# Patient Record
Sex: Female | Born: 1935 | Race: White | Hispanic: No | Smoking: Never smoker
Health system: Southern US, Community
[De-identification: ages and names within clinical notes are randomized; demographics above are authoritative.]

## PROBLEM LIST (undated history)

## (undated) DIAGNOSIS — I729 Aneurysm of unspecified site: Secondary | ICD-10-CM

## (undated) DIAGNOSIS — I447 Left bundle-branch block, unspecified: Secondary | ICD-10-CM

## (undated) DIAGNOSIS — I639 Cerebral infarction, unspecified: Secondary | ICD-10-CM

## (undated) DIAGNOSIS — I219 Acute myocardial infarction, unspecified: Secondary | ICD-10-CM

## (undated) HISTORY — PX: APPENDECTOMY: SHX54

## (undated) HISTORY — PX: ROTATOR CUFF REPAIR: SHX139

## (undated) HISTORY — PX: ABDOMINAL HYSTERECTOMY: SHX81

---

## 2000-06-27 ENCOUNTER — Encounter: Admission: RE | Admit: 2000-06-27 | Discharge: 2000-06-27 | Payer: Self-pay | Admitting: *Deleted

## 2000-06-27 ENCOUNTER — Encounter: Payer: Self-pay | Admitting: Internal Medicine

## 2001-06-29 ENCOUNTER — Encounter: Payer: Self-pay | Admitting: Internal Medicine

## 2001-06-29 ENCOUNTER — Encounter: Admission: RE | Admit: 2001-06-29 | Discharge: 2001-06-29 | Payer: Self-pay | Admitting: Internal Medicine

## 2003-08-01 ENCOUNTER — Encounter: Payer: Self-pay | Admitting: Orthopedic Surgery

## 2003-08-04 ENCOUNTER — Observation Stay (HOSPITAL_COMMUNITY): Admission: RE | Admit: 2003-08-04 | Discharge: 2003-08-05 | Payer: Self-pay | Admitting: Orthopedic Surgery

## 2006-09-03 ENCOUNTER — Ambulatory Visit: Payer: Self-pay | Admitting: Gastroenterology

## 2006-09-08 ENCOUNTER — Ambulatory Visit: Payer: Self-pay | Admitting: Gastroenterology

## 2011-05-20 ENCOUNTER — Inpatient Hospital Stay: Payer: Self-pay | Admitting: Internal Medicine

## 2011-05-20 IMAGING — CR DG CHEST 1V PORT
1 series · 1 of 1 positions shown · non-contrast
Comparison: none

REASON FOR EXAM: Chest Pain
COMMENTS:

PROCEDURE:     DXR - DXR PORTABLE CHEST SINGLE VIEW  - [DATE] [DATE]
RESULT:     The lungs are clear. The cardiac silhouette and visualized bony
skeleton are unremarkable.

[view not recorded]
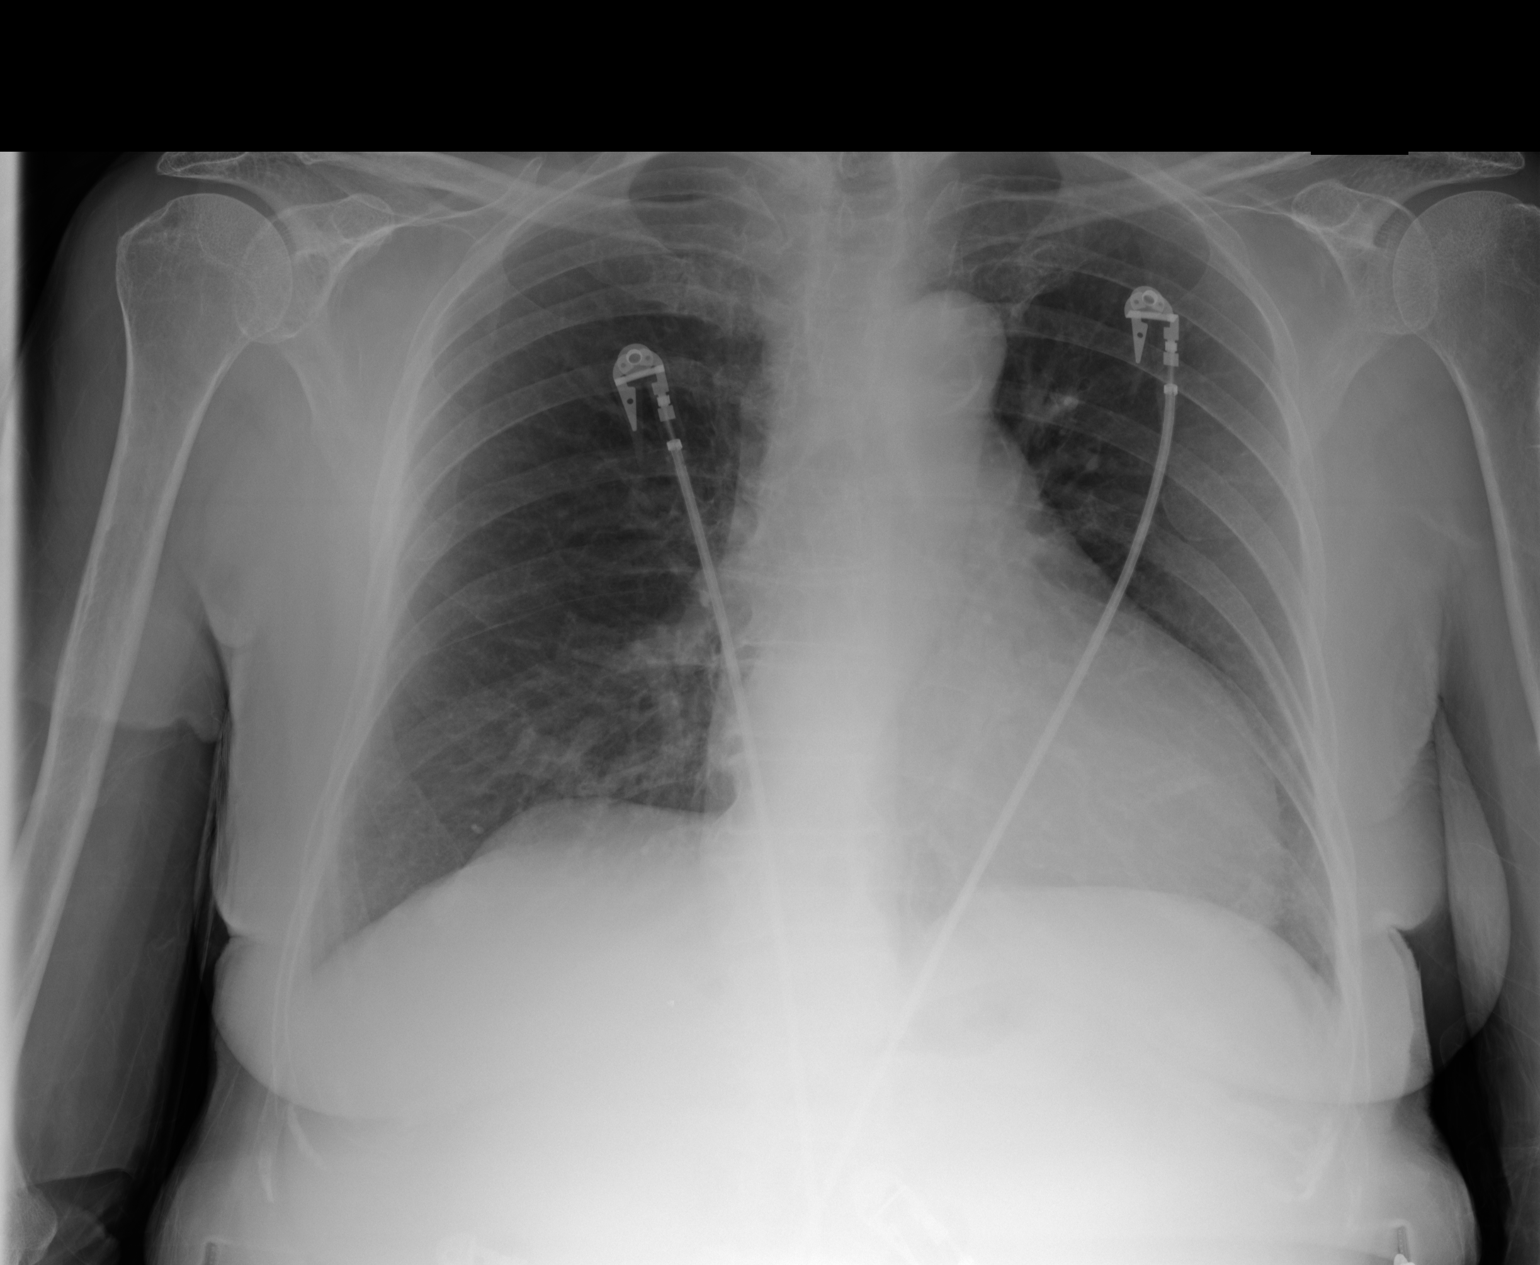

[1 of 1 positions shown; findings below may reference images not displayed]

IMPRESSION: 1. Chest radiograph without evidence of acute cardiopulmonary disease.

## 2011-05-20 IMAGING — US US CAROTID DUPLEX BILAT
1 series · 17 of 24 positions shown · non-contrast
Comparison: none

REASON FOR EXAM: syncope
COMMENTS:

[Series 1: us carotid duplex bilat · 17 of 63 slices shown]
[im 1/63]
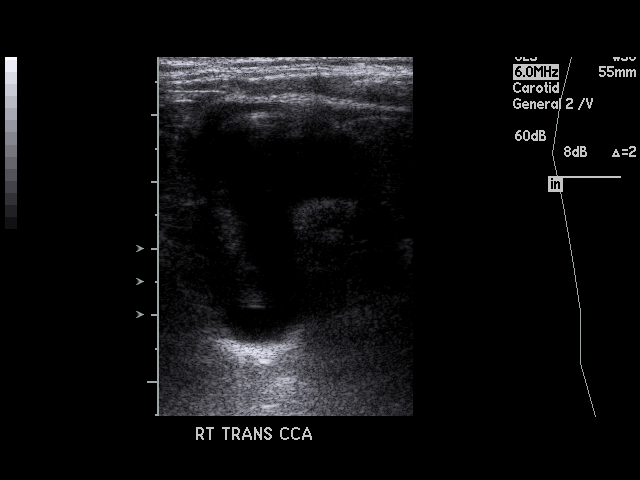
[im 6/63]
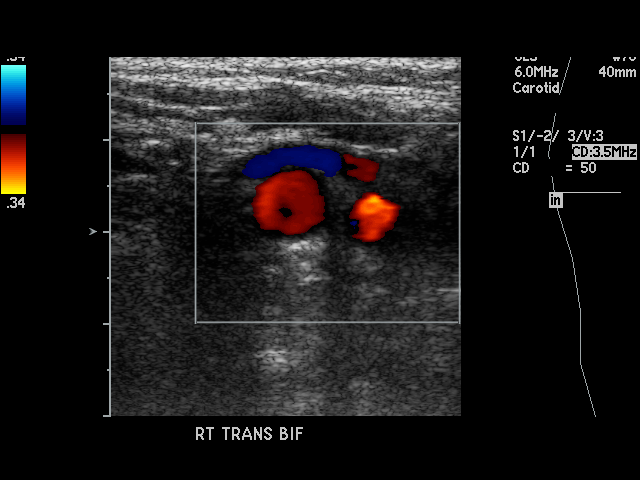
[im 9/63]
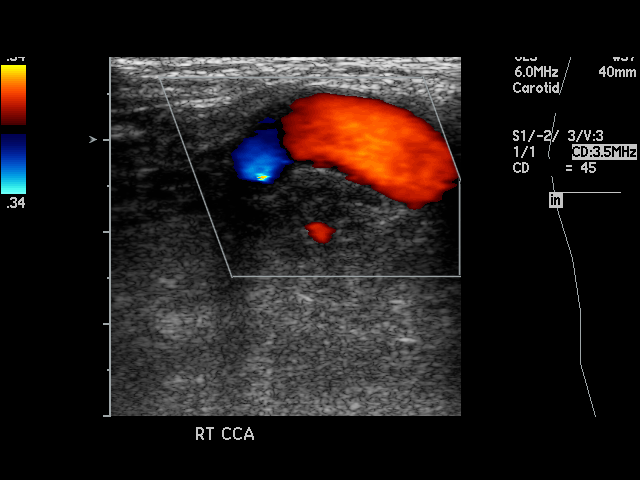
[im 11/63]
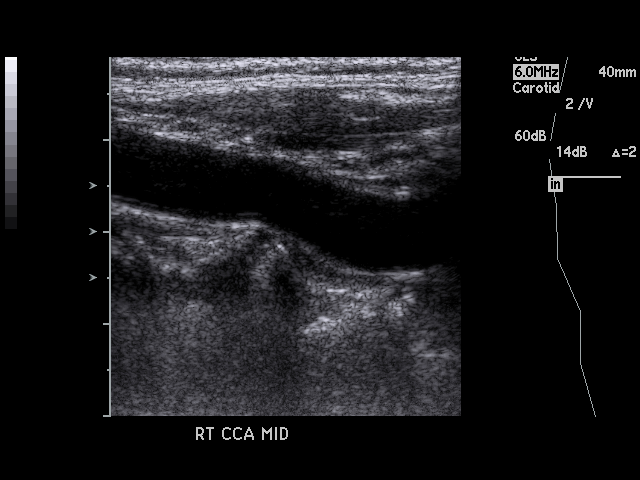
[im 17/63]
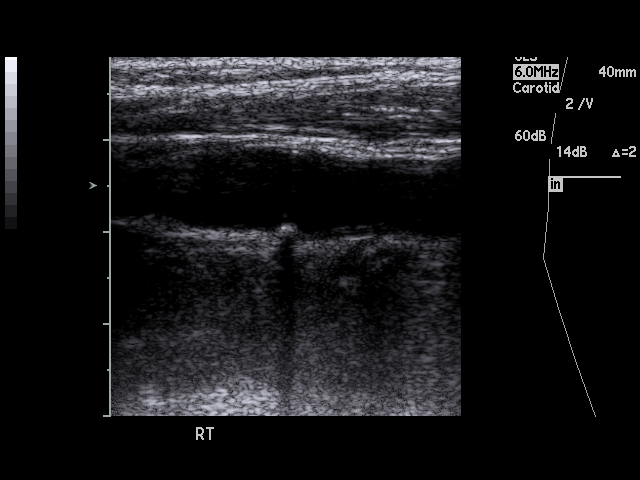
[im 19/63]
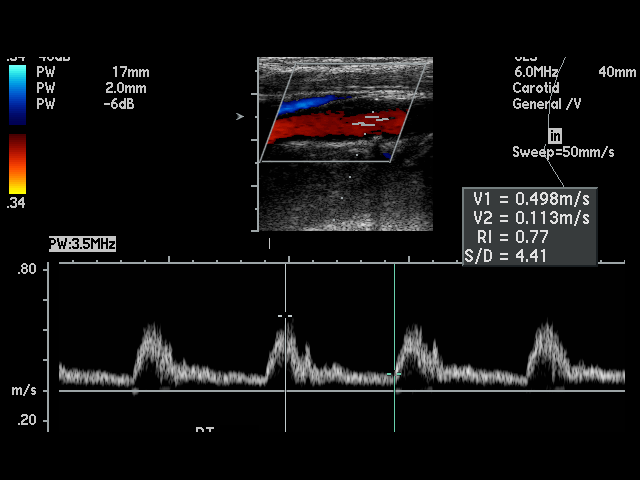
[im 25/63]
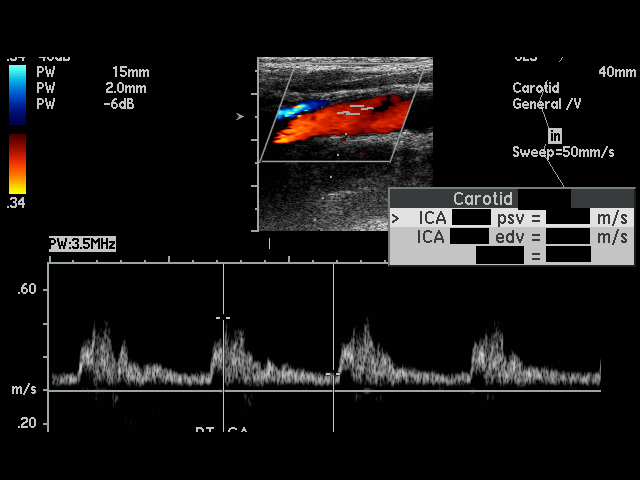
[im 27/63]
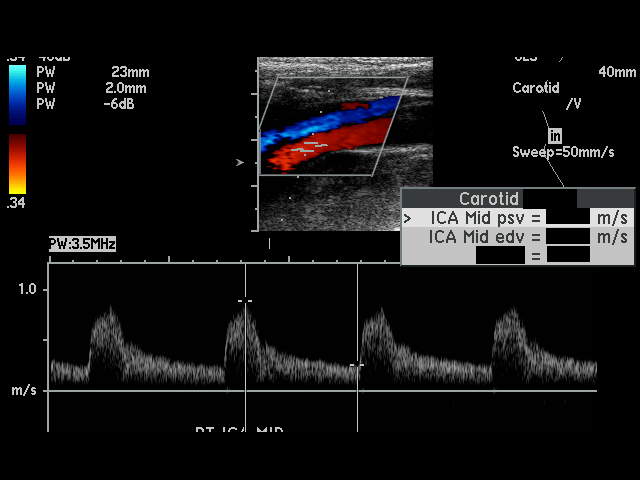
[im 33/63]
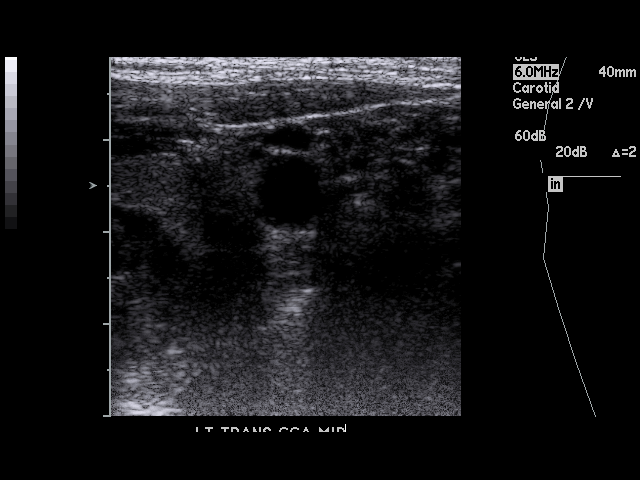
[im 36/63]
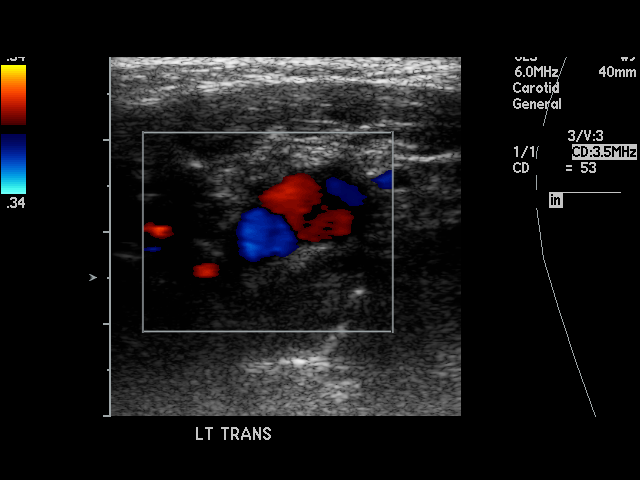
[im 38/63]
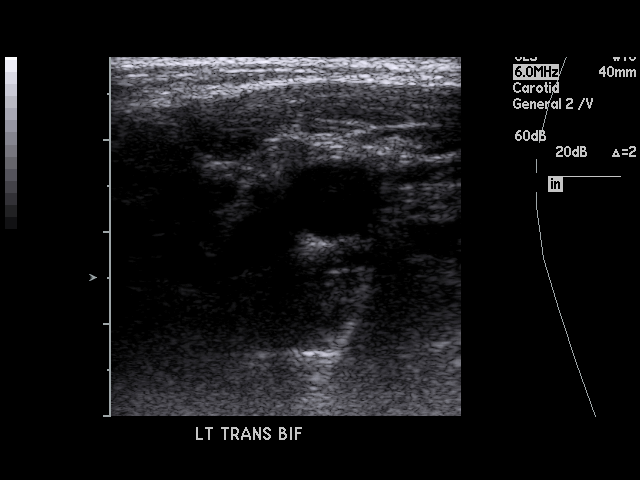
[im 44/63]
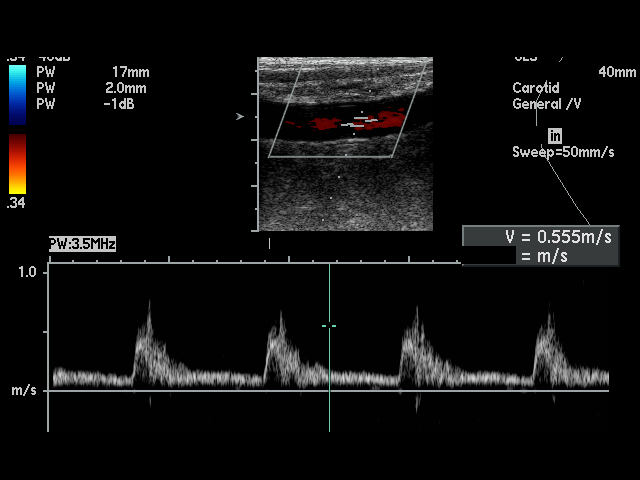
[im 46/63]
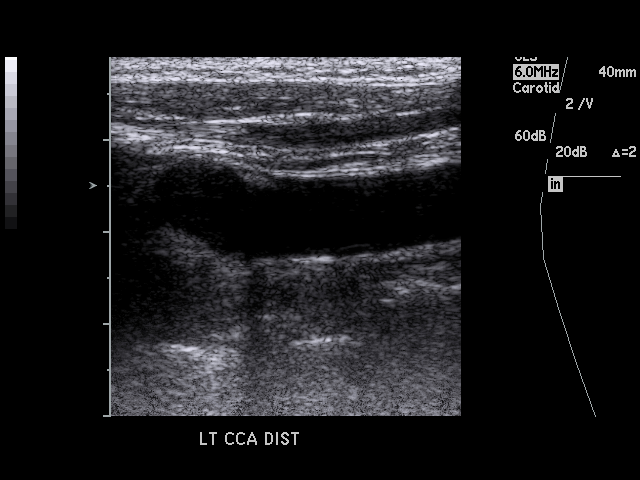
[im 52/63]
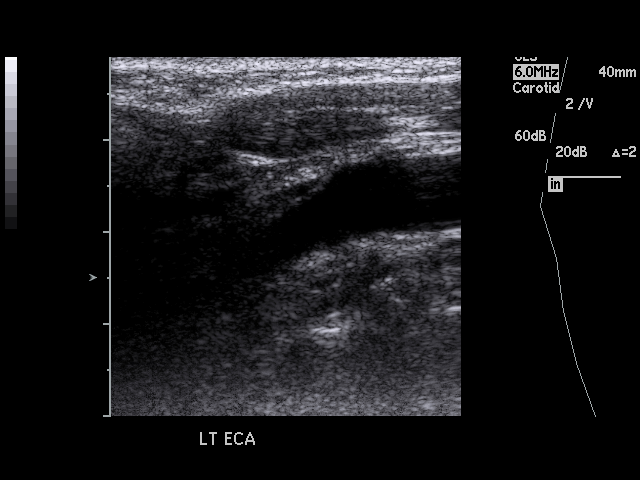
[im 54/63]
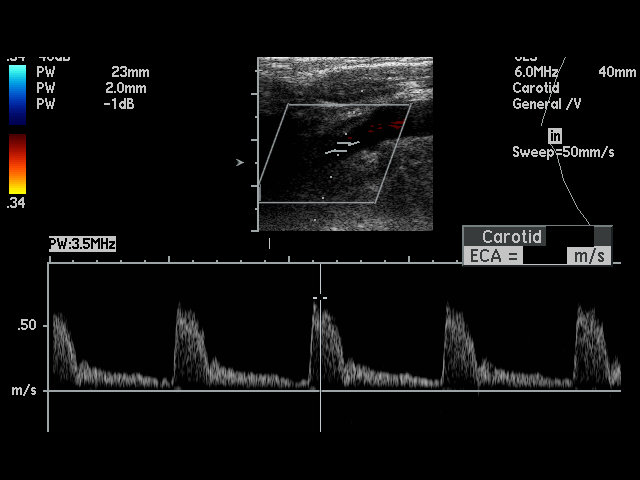
[im 57/63]
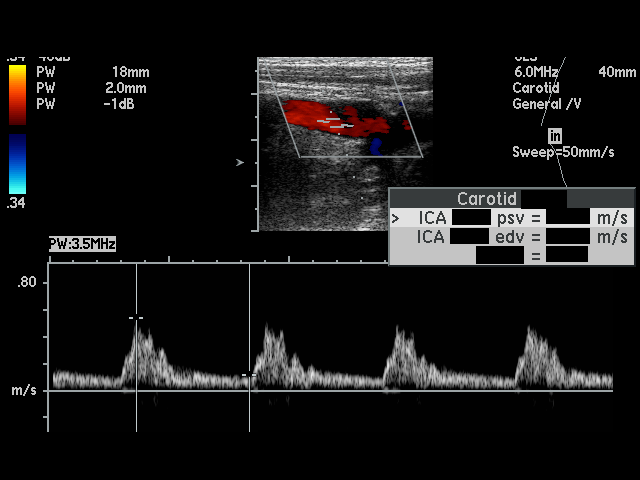
[im 63/63]
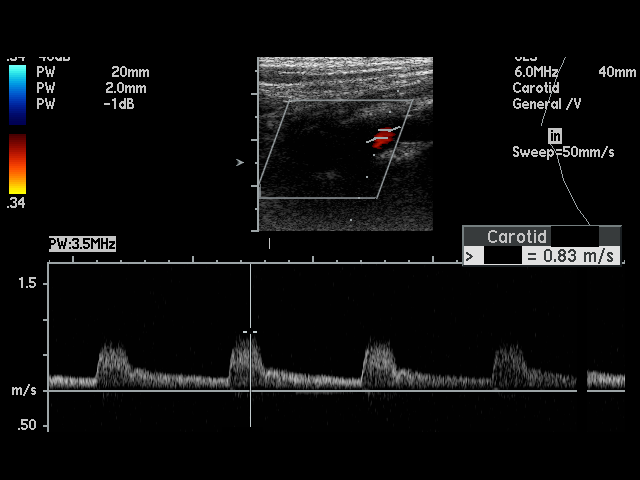

[17 of 24 positions shown; findings below may reference images not displayed]

PROCEDURE:     US  - US CAROTID DOPPLER BILATERAL  - [DATE]  [DATE]

RESULT:     Grayscale, duplex color and SPECTRAL waveform imaging was
performed utilizing B-mode sonography.

Evaluation of the right carotid system demonstrates mixed, smooth and
calcific plaque within the internal carotid artery and carotid bulb
demonstrating less than 50% stenosis. Similar findings are identified on the
left. Color flow and SPECTRAL waveform imaging is unremarkable.

ICA/CCA ratios:  Right 1.237 and Left 0.78.

Antegrade flow is identified within the right and left vertebral arteries.
IMPRESSION: No sonographic evidence of hemodynamically significant stenosis within the
right or left carotid systems.

## 2011-10-23 ENCOUNTER — Emergency Department: Payer: Self-pay | Admitting: Emergency Medicine

## 2014-04-11 DIAGNOSIS — M5416 Radiculopathy, lumbar region: Secondary | ICD-10-CM | POA: Insufficient documentation

## 2014-04-11 DIAGNOSIS — M5136 Other intervertebral disc degeneration, lumbar region: Secondary | ICD-10-CM | POA: Insufficient documentation

## 2014-04-19 ENCOUNTER — Ambulatory Visit: Payer: Self-pay | Admitting: Ophthalmology

## 2014-05-03 ENCOUNTER — Ambulatory Visit: Payer: Self-pay | Admitting: Ophthalmology

## 2014-10-16 DIAGNOSIS — M1710 Unilateral primary osteoarthritis, unspecified knee: Secondary | ICD-10-CM | POA: Insufficient documentation

## 2015-02-25 NOTE — Op Note (Signed)
PATIENT NAME:  Michele [name], ARTEAGA MR#:  [id number] DATE OF BIRTH:  [birth year]  DATE OF PROCEDURE:  05/03/2014  PREOPERATIVE DIAGNOSIS: Visually significant cataract of the left eye.   POSTOPERATIVE DIAGNOSIS: Visually significant cataract of the left eye.   OPERATIVE PROCEDURE: Cataract extraction by phacoemulsification with implant of intraocular lens to left eye.   SURGEON: Birder Robson, MD  ANESTHESIA:  1. Managed anesthesia care.  2. Topical tetracaine drops followed by 2% Xylocaine jelly applied in the preoperative holding area.   COMPLICATIONS: None.   TECHNIQUE:  Stop and chop.   DESCRIPTION OF PROCEDURE: The patient was examined and consented in the preoperative holding area where the aforementioned topical anesthesia was applied to the left eye and then brought back to the Operating Room where the left eye was prepped and draped in the usual sterile ophthalmic fashion and a lid speculum was placed. A paracentesis was created with the side port blade and the anterior chamber was filled with viscoelastic. A near clear corneal incision was performed with the steel keratome. A continuous curvilinear capsulorrhexis was performed with a cystotome followed by the capsulorrhexis forceps. Hydrodissection and hydrodelineation were carried out with BSS on a blunt cannula. The lens was removed in a stop and chop technique and the remaining cortical material was removed with the irrigation-aspiration handpiece. The capsular bag was inflated with viscoelastic and the Tecnis ZCB00 24.0-diopter lens, serial number [id number] was placed in the capsular bag without complication. The remaining viscoelastic was removed from the eye with the irrigation-aspiration handpiece. The wounds were hydrated. The anterior chamber was flushed with Miostat and the eye was inflated to physiologic pressure. 0.1 mL of cefuroxime concentration 10 mg/mL was placed in the anterior chamber. The wounds were found to be water  tight. The eye was dressed with Vigamox. The patient was given protective glasses to wear throughout the day and a shield with which to sleep tonight. The patient was also given drops with which to begin a drop regimen today and will follow-up with me in one day.   Please note that cefuroximewas not placed within the eye due to penicillin allergy.  Rather, a 4:1 dilution of Vigamox was used.   ____________________________ Livingston Diones Porfilio, MD wlp:dd D: 05/03/2014 14:26:19 ET T: 05/03/2014 21:56:51 ET JOB#: [id number]  cc: William L. Porfilio, MD, <Dictator> Livingston Diones PORFILIO MD ELECTRONICALLY SIGNED 05/05/2014 10:49

## 2015-03-14 DIAGNOSIS — M170 Bilateral primary osteoarthritis of knee: Secondary | ICD-10-CM | POA: Insufficient documentation

## 2015-05-26 ENCOUNTER — Other Ambulatory Visit
Admission: RE | Admit: 2015-05-26 | Discharge: 2015-05-26 | Disposition: A | Discharge status: Home / Self Care | Payer: Medicare Other | Source: Ambulatory Visit | Attending: Internal Medicine | Admitting: Internal Medicine

## 2015-05-26 DIAGNOSIS — D692 Other nonthrombocytopenic purpura: Secondary | ICD-10-CM | POA: Diagnosis present

## 2015-05-26 LAB — APTT: aPTT: 33 seconds (ref 24–36)

## 2015-09-13 DIAGNOSIS — M47812 Spondylosis without myelopathy or radiculopathy, cervical region: Secondary | ICD-10-CM | POA: Insufficient documentation

## 2016-03-01 DIAGNOSIS — Z8673 Personal history of transient ischemic attack (TIA), and cerebral infarction without residual deficits: Secondary | ICD-10-CM | POA: Insufficient documentation

## 2016-06-19 DIAGNOSIS — E538 Deficiency of other specified B group vitamins: Secondary | ICD-10-CM | POA: Insufficient documentation

## 2016-11-25 DIAGNOSIS — I471 Supraventricular tachycardia: Secondary | ICD-10-CM | POA: Insufficient documentation

## 2016-12-19 DIAGNOSIS — E559 Vitamin D deficiency, unspecified: Secondary | ICD-10-CM | POA: Insufficient documentation

## 2016-12-19 DIAGNOSIS — I447 Left bundle-branch block, unspecified: Secondary | ICD-10-CM | POA: Insufficient documentation

## 2017-06-23 DIAGNOSIS — I671 Cerebral aneurysm, nonruptured: Secondary | ICD-10-CM | POA: Insufficient documentation

## 2017-06-30 DIAGNOSIS — E782 Mixed hyperlipidemia: Secondary | ICD-10-CM | POA: Insufficient documentation

## 2017-11-05 ENCOUNTER — Emergency Department: Payer: Medicare Other

## 2017-11-05 ENCOUNTER — Other Ambulatory Visit: Payer: Self-pay

## 2017-11-05 ENCOUNTER — Inpatient Hospital Stay
Admission: EM | Admit: 2017-11-05 | Discharge: 2017-11-12 | DRG: 193 | Disposition: A | Discharge status: Skilled Nursing Facility | Payer: Medicare Other | Attending: Internal Medicine | Admitting: Internal Medicine

## 2017-11-05 ENCOUNTER — Encounter: Payer: Self-pay | Admitting: Emergency Medicine

## 2017-11-05 DIAGNOSIS — G9341 Metabolic encephalopathy: Secondary | ICD-10-CM | POA: Diagnosis present

## 2017-11-05 DIAGNOSIS — Z880 Allergy status to penicillin: Secondary | ICD-10-CM

## 2017-11-05 DIAGNOSIS — R410 Disorientation, unspecified: Secondary | ICD-10-CM

## 2017-11-05 DIAGNOSIS — Z8673 Personal history of transient ischemic attack (TIA), and cerebral infarction without residual deficits: Secondary | ICD-10-CM

## 2017-11-05 DIAGNOSIS — I10 Essential (primary) hypertension: Secondary | ICD-10-CM | POA: Diagnosis present

## 2017-11-05 DIAGNOSIS — I252 Old myocardial infarction: Secondary | ICD-10-CM

## 2017-11-05 DIAGNOSIS — F411 Generalized anxiety disorder: Secondary | ICD-10-CM | POA: Diagnosis present

## 2017-11-05 DIAGNOSIS — E876 Hypokalemia: Secondary | ICD-10-CM | POA: Diagnosis present

## 2017-11-05 DIAGNOSIS — R7989 Other specified abnormal findings of blood chemistry: Secondary | ICD-10-CM

## 2017-11-05 DIAGNOSIS — B029 Zoster without complications: Secondary | ICD-10-CM | POA: Diagnosis not present

## 2017-11-05 DIAGNOSIS — I440 Atrioventricular block, first degree: Secondary | ICD-10-CM | POA: Diagnosis present

## 2017-11-05 DIAGNOSIS — R296 Repeated falls: Secondary | ICD-10-CM | POA: Diagnosis present

## 2017-11-05 DIAGNOSIS — J181 Lobar pneumonia, unspecified organism: Principal | ICD-10-CM | POA: Diagnosis present

## 2017-11-05 DIAGNOSIS — R778 Other specified abnormalities of plasma proteins: Secondary | ICD-10-CM

## 2017-11-05 DIAGNOSIS — R0602 Shortness of breath: Secondary | ICD-10-CM

## 2017-11-05 DIAGNOSIS — I2510 Atherosclerotic heart disease of native coronary artery without angina pectoris: Secondary | ICD-10-CM | POA: Diagnosis present

## 2017-11-05 DIAGNOSIS — J189 Pneumonia, unspecified organism: Secondary | ICD-10-CM

## 2017-11-05 DIAGNOSIS — I248 Other forms of acute ischemic heart disease: Secondary | ICD-10-CM | POA: Diagnosis present

## 2017-11-05 DIAGNOSIS — I447 Left bundle-branch block, unspecified: Secondary | ICD-10-CM | POA: Diagnosis present

## 2017-11-05 DIAGNOSIS — Z9089 Acquired absence of other organs: Secondary | ICD-10-CM

## 2017-11-05 DIAGNOSIS — Z90710 Acquired absence of both cervix and uterus: Secondary | ICD-10-CM

## 2017-11-05 HISTORY — DX: Acute myocardial infarction, unspecified: I21.9

## 2017-11-05 HISTORY — DX: Cerebral infarction, unspecified: I63.9

## 2017-11-05 LAB — URINALYSIS, COMPLETE (UACMP) WITH MICROSCOPIC
BACTERIA UA: NONE SEEN
Bilirubin Urine: NEGATIVE
GLUCOSE, UA: NEGATIVE mg/dL
Hgb urine dipstick: NEGATIVE
Ketones, ur: NEGATIVE mg/dL
NITRITE: NEGATIVE
PH: 5 (ref 5.0–8.0)
Protein, ur: 30 mg/dL — AB
SPECIFIC GRAVITY, URINE: 1.021 (ref 1.005–1.030)

## 2017-11-05 LAB — CBC
HEMATOCRIT: 36.4 % (ref 35.0–47.0)
Hemoglobin: 12.5 g/dL (ref 12.0–16.0)
MCH: 29.9 pg (ref 26.0–34.0)
MCHC: 34.2 g/dL (ref 32.0–36.0)
MCV: 87.4 fL (ref 80.0–100.0)
Platelets: 394 10*3/uL (ref 150–440)
RBC: 4.17 MIL/uL (ref 3.80–5.20)
RDW: 13.5 % (ref 11.5–14.5)
WBC: 10.9 10*3/uL (ref 3.6–11.0)

## 2017-11-05 LAB — BASIC METABOLIC PANEL
ANION GAP: 12 (ref 5–15)
BUN: 14 mg/dL (ref 6–20)
CO2: 24 mmol/L (ref 22–32)
Calcium: 9.5 mg/dL (ref 8.9–10.3)
Chloride: 100 mmol/L — ABNORMAL LOW (ref 101–111)
Creatinine, Ser: 0.89 mg/dL (ref 0.44–1.00)
GFR, EST NON AFRICAN AMERICAN: 55 mL/min — AB (ref >60)
GLUCOSE: 136 mg/dL — AB (ref 65–99)
POTASSIUM: 3 mmol/L — AB (ref 3.5–5.1)
Sodium: 136 mmol/L (ref 135–145)

## 2017-11-05 LAB — LACTIC ACID, PLASMA: LACTIC ACID, VENOUS: 1.1 mmol/L (ref 0.5–1.9)

## 2017-11-05 LAB — TROPONIN I: Troponin I: 0.04 ng/mL (ref <0.03)

## 2017-11-05 IMAGING — CR DG CHEST 2V
2 series · 2 of 2 positions shown · non-contrast
Comparison: [DATE]

CLINICAL DATA: Patient seen Dr VERT 2 days ago and went from zpak
to doxycycline for bilateral pneumonia; having productive cough
yellow sputum x 2 weeks, at times brown with sinus congestion AND
pressure; patient reports discomfort to chest from coughing as well
as headache ; history of aneurysm

EXAM:
CHEST  2 VIEW

[chest pa]
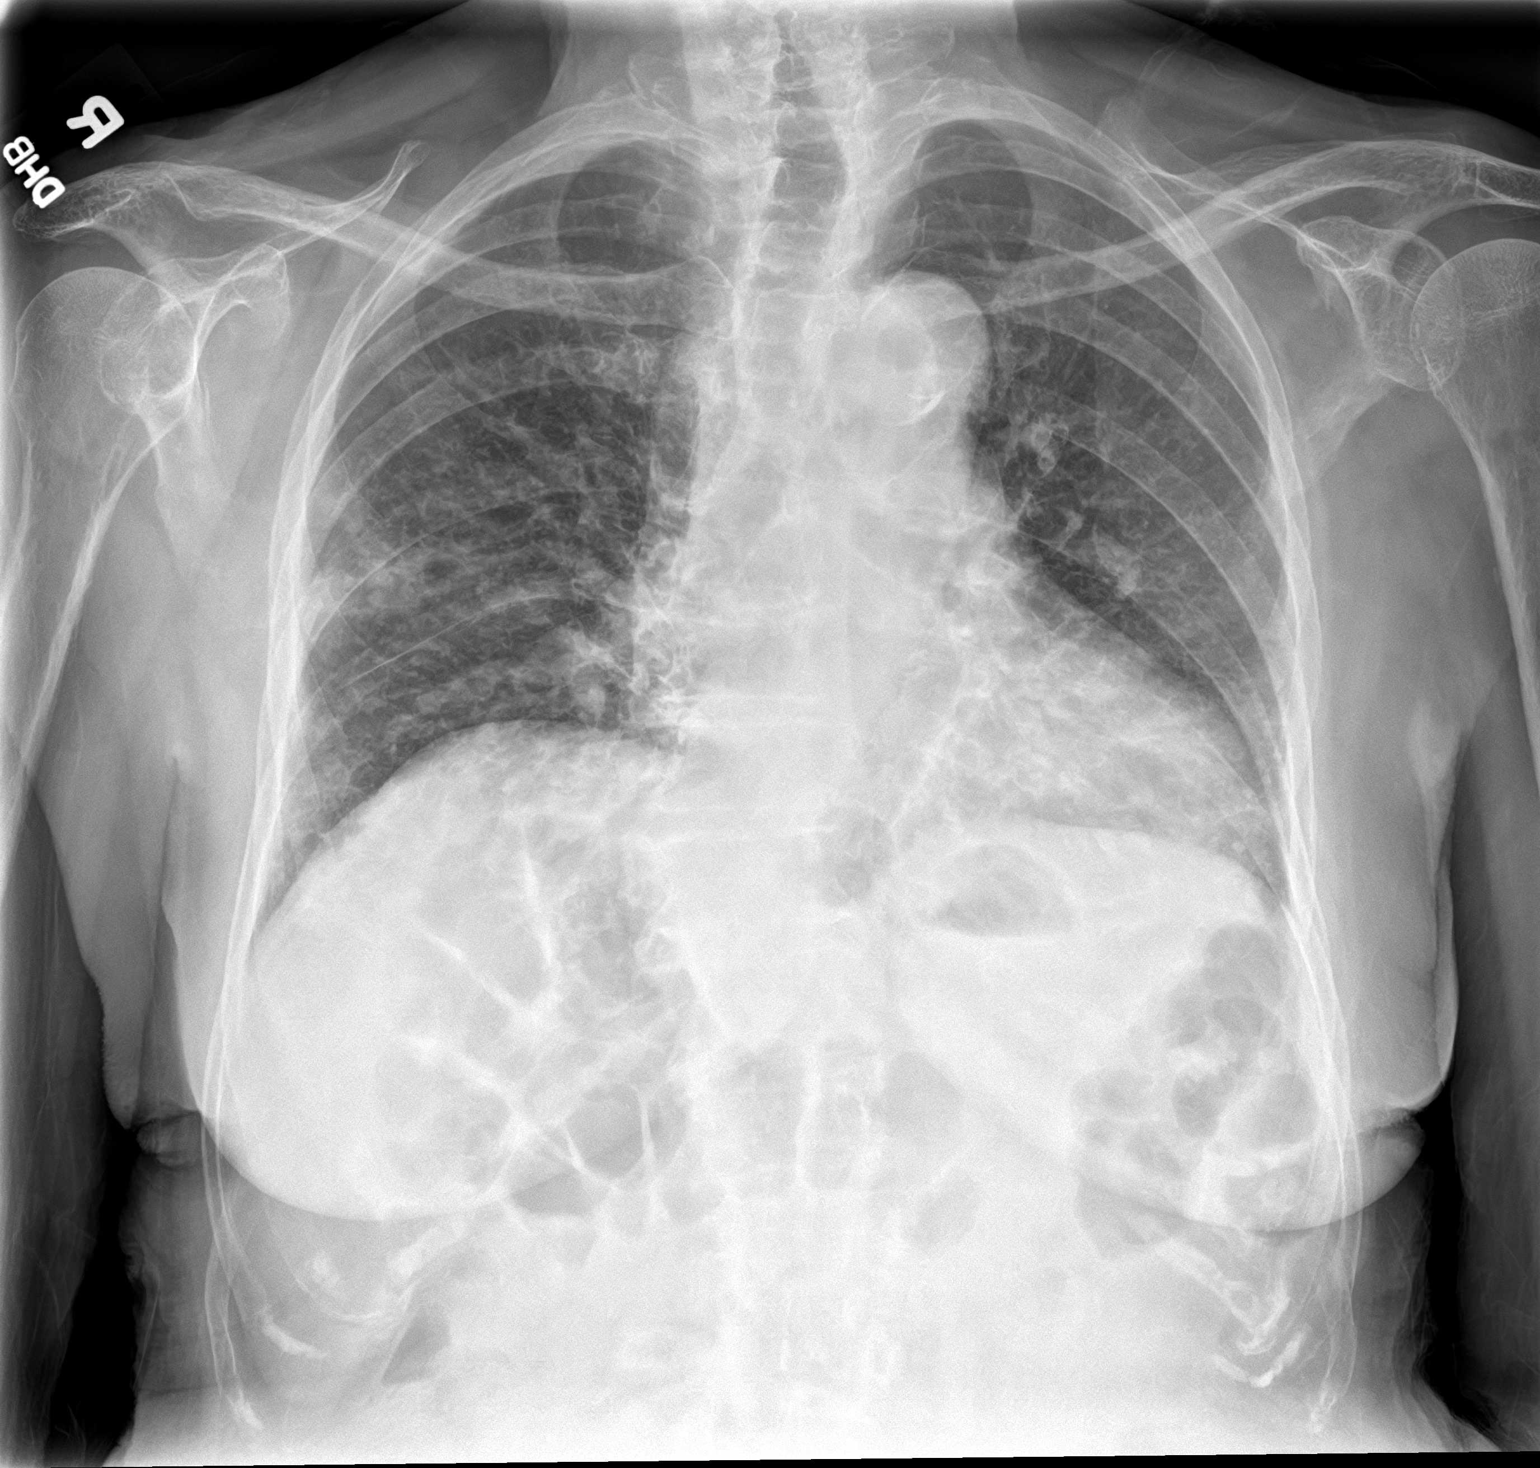

[chest lat]
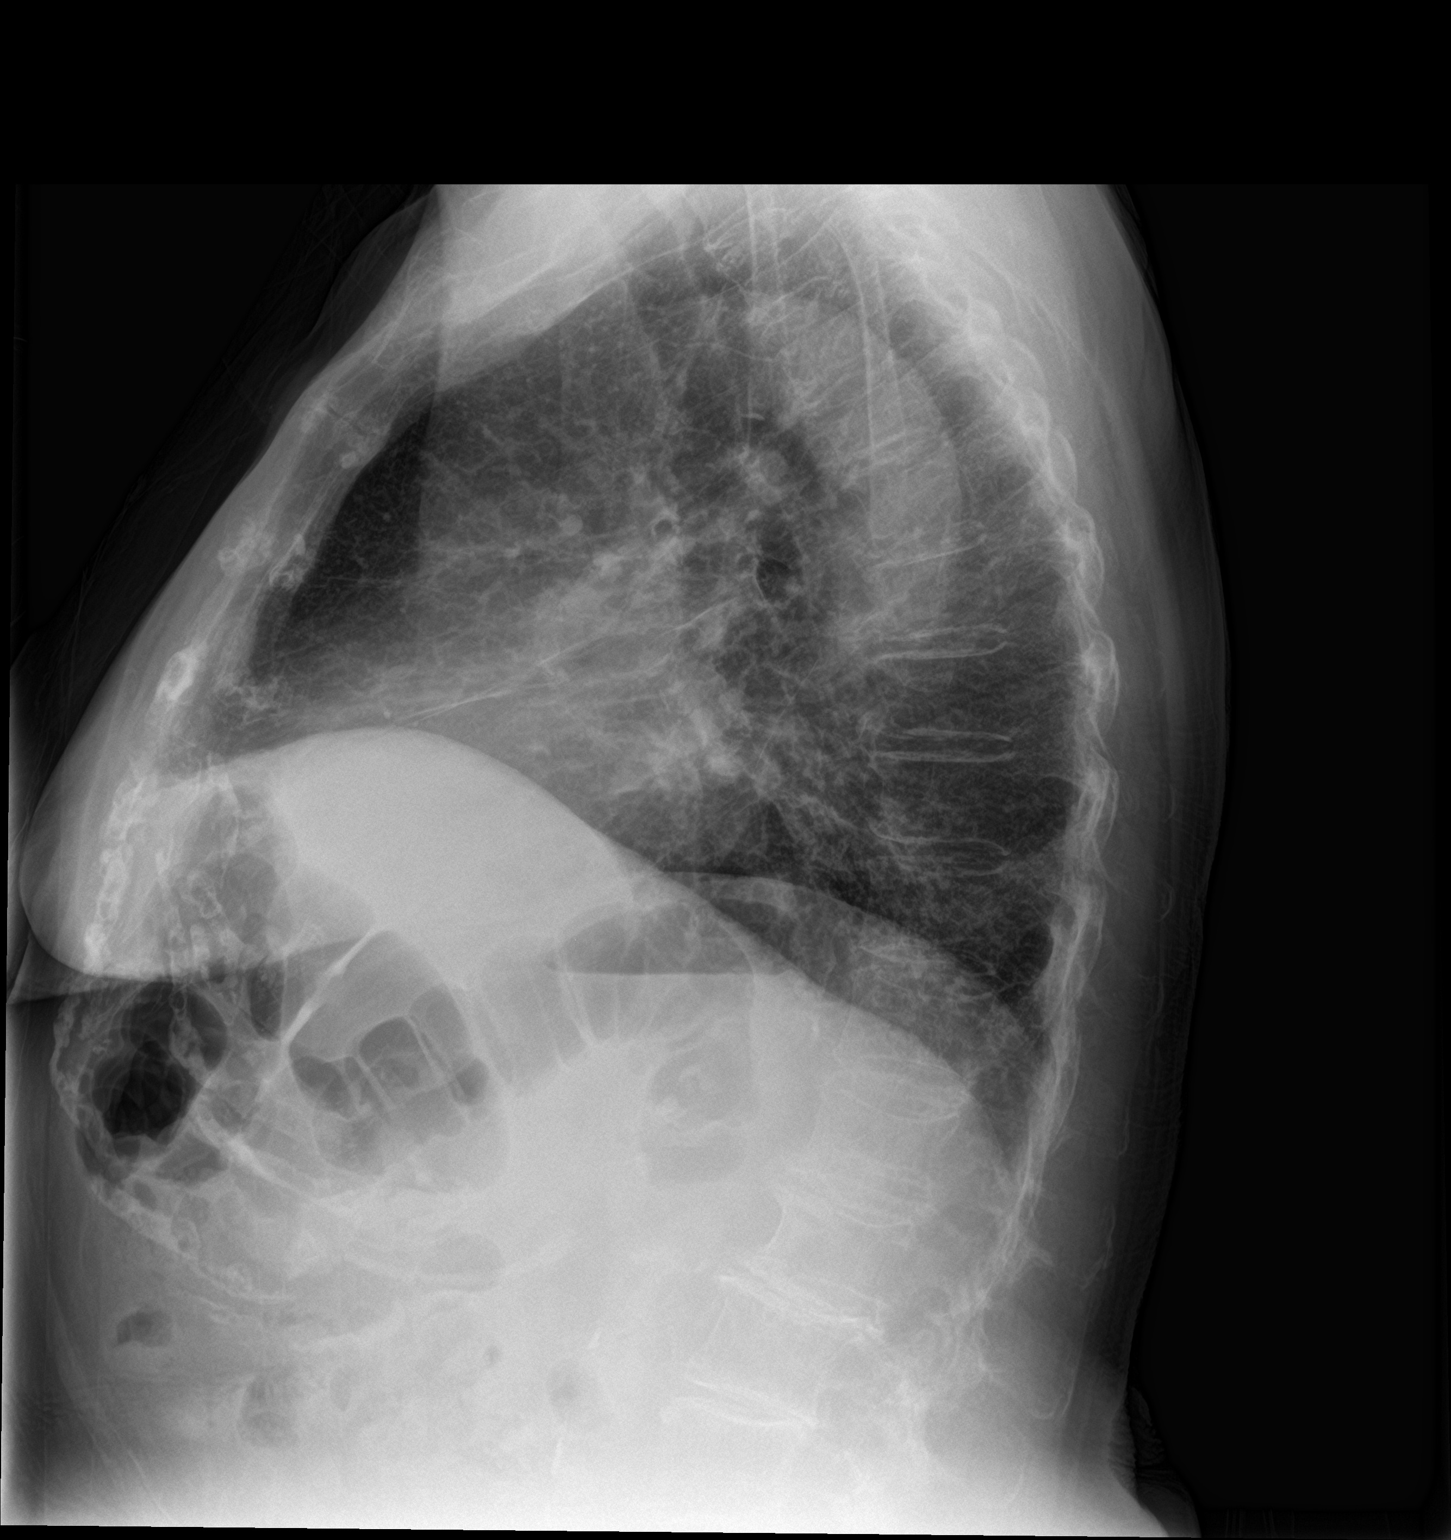

[2 of 2 positions shown; findings below may reference images not displayed]

FINDINGS: Cardiac enlargement. No vascular congestion or edema. There is
diffuse interstitial pattern to the lungs with peribronchial
thickening likely representing diffuse bronchitis. This could be
either acute or chronic. There is a focal irregular opacity over the
right mid lung. This could represent focal pneumonia, mucous
plugging, or a parenchymal nodule. This is new since previous study.
Recommend follow-up after treatment of acute process. Calcified
granulomas in the left lung. Calcified and tortuous aorta.
Degenerative changes throughout the spine.
IMPRESSION: Bronchitic changes in the lungs may represent acute or chronic
bronchitis. Focal opacity in the right mid lung could represent
focal pneumonia, mucous plugging, or parenchymal nodule. Followup PA
and lateral chest X-ray is recommended in 3-4 weeks following trial
of appropriate clinical therapy to ensure resolution and exclude
underlying malignancy. Bronchiectasis. Aortic atherosclerosis.

## 2017-11-05 MED ORDER — CEFTRIAXONE SODIUM IN DEXTROSE 20 MG/ML IV SOLN
1.0000 g | Freq: Once | INTRAVENOUS | Status: AC
  Administered 2017-11-05: 1 g via INTRAVENOUS
  Filled 2017-11-05: qty 50

## 2017-11-05 MED ORDER — SODIUM CHLORIDE 0.9 % IV BOLUS (SEPSIS)
1000.0000 mL | Freq: Once | INTRAVENOUS | Status: AC
  Administered 2017-11-05: 1000 mL via INTRAVENOUS

## 2017-11-05 MED ORDER — DEXTROSE 5 % IV SOLN
500.0000 mg | Freq: Once | INTRAVENOUS | Status: AC
  Administered 2017-11-05: 500 mg via INTRAVENOUS
  Filled 2017-11-05: qty 500

## 2017-11-05 NOTE — ED Provider Notes (Addendum)
[facility] Emergency Department Provider Note  ____________________________________________   First MD Initiated Contact with Patient 11/05/17 2232     (approximate)  I have reviewed the triage vital signs and the nursing notes.   HISTORY  Chief Complaint Cough and Nasal Congestion    HPI Michele [name] is a 84 y.o. female is brought to the emergency department by her daughter for roughly 1 week of productive cough and shortness of breath.  Symptoms had insidious onset and have been intermittent ever since.  The patient completed a course of azithromycin without improvement and days ago was given doxycycline by her primary care physician which is not improved her symptoms.  The patient's daughter reports that the patient is normally alert and oriented x4, however recently has become more confused and has almost fallen multiple times at home.  The patient does report sharp mild to moderate upper chest pain nonradiating worse with cough improved when not coughing.  Past Medical History:  Diagnosis Date  . MI (myocardial infarction) ([place])   . Stroke [facility])     There are no active problems to display for this patient.   Past Surgical History:  Procedure Laterality Date  . ABDOMINAL HYSTERECTOMY    . APPENDECTOMY    . ROTATOR CUFF REPAIR Left     Prior to Admission medications   Medication Sig Start Date End Date Taking? Authorizing Provider  ALPRAZolam Duanne Moron) 0.25 MG tablet Take 0.25 mg by mouth at bedtime as needed for anxiety.   Yes [provider]  benzonatate (TESSALON) 200 MG capsule Take 200 mg by mouth 3 (three) times daily as needed for cough.   Yes [provider]  busPIRone (BUSPAR) 5 MG tablet Take 5 mg by mouth 2 (two) times daily.   Yes [provider]  diltiazem (DILACOR XR) 120 MG 24 hr capsule Take 120 mg by mouth daily.   Yes [provider]  doxycycline (VIBRA-TABS) 100 MG tablet Take 100 mg by mouth  2 (two) times daily.   Yes [provider]  escitalopram (LEXAPRO) 5 MG tablet Take 5 mg by mouth daily.   Yes [provider]  metoprolol succinate (TOPROL-XL) 25 MG 24 hr tablet Take 25 mg by mouth daily.   Yes [provider]  ramipril (ALTACE) 10 MG capsule Take 10 mg by mouth 2 (two) times daily.   Yes [provider]    Allergies Penicillins  No family history on file.  Social History Social History   Tobacco Use  . Smoking status: Never Smoker  . Smokeless tobacco: Never Used  Substance Use Topics  . Alcohol use: No    Frequency: Never  . Drug use: Not on file    Review of Systems Constitutional: Positive for fever Eyes: No visual changes. ENT: No sore throat. Cardiovascular: Positive for chest pain. Respiratory: Positive for shortness of breath. Gastrointestinal: No abdominal pain.  No nausea, no vomiting.  No diarrhea.  No constipation. Genitourinary: Negative for dysuria. Musculoskeletal: Negative for back pain. Skin: Negative for rash. Neurological: Negative for headaches, focal weakness or numbness.   ____________________________________________   PHYSICAL EXAM:  VITAL SIGNS: ED Triage Vitals  Enc Vitals Group     BP 11/05/17 2009 (!) 150/100     Pulse Rate 11/05/17 2009 86     Resp 11/05/17 2009 20     Temp 11/05/17 2009 98.2 F (36.8 C)     Temp Source 11/05/17 2009 Oral  SpO2 11/05/17 2009 98 %     Weight 11/05/17 2009 131 lb (59.4 kg)     Height 11/05/17 2009 5\' 3"  (1.6 m)     Head Circumference --      Peak Flow --      Pain Score 11/05/17 2007 6     Pain Loc --      Pain Edu? --      Excl. in [place]? --     Constitutional: Alert and oriented x1 to name only she thinks is 1980 and does not know the president and she is clearly quite confused.  No acute distress Eyes: PERRL EOMI. Head: Atraumatic. Nose: No congestion/rhinnorhea. Mouth/Throat: No trismus Neck: No stridor.   Cardiovascular: Normal  rate, regular rhythm. Grossly normal heart sounds.  Good peripheral circulation. Respiratory: Slightly increased respiratory effort with rales in mid lung fields on the right Gastrointestinal: Soft nontender Musculoskeletal: No lower extremity edema   Neurologic:  Normal speech and language. No gross focal neurologic deficits are appreciated. Skin:  Skin is warm, dry and intact. No rash noted. Psychiatric: Pleasantly delirious    ____________________________________________   DIFFERENTIAL includes but not limited to  Community-acquired pneumonia, influenza, viral syndrome, bronchitis, delirium, urinary tract infection, intracerebral hemorrhage ____________________________________________   LABS (all labs ordered are listed, but only abnormal results are displayed)  Labs Reviewed  BASIC METABOLIC PANEL - Abnormal; Notable for the following components:      Result Value   Potassium 3.0 (*)    Chloride 100 (*)    Glucose, Bld 136 (*)    GFR calc non Af Amer 55 (*)    All other components within normal limits  TROPONIN I - Abnormal; Notable for the following components:   Troponin I 0.04 (*)    All other components within normal limits  URINALYSIS, COMPLETE (UACMP) WITH MICROSCOPIC - Abnormal; Notable for the following components:   Color, Urine AMBER (*)    APPearance CLOUDY (*)    Protein, ur 30 (*)    Leukocytes, UA MODERATE (*)    Squamous Epithelial / LPF 6-30 (*)    Non Squamous Epithelial 0-5 (*)    All other components within normal limits  CULTURE, BLOOD (ROUTINE X 2)  CULTURE, BLOOD (ROUTINE X 2)  CBC  LACTIC ACID, PLASMA  PROCALCITONIN  LACTIC ACID, PLASMA    Lab work reviewed by me with slightly elevated troponin likely secondary to demand __________________________________________  EKG  ED ECG REPORT I, Darel Hong, the attending physician, personally viewed and interpreted this ECG.  Date: 11/05/2017 EKG Time:  Rate: 83 Rhythm: normal sinus  rhythm QRS Axis: Leftward axis Intervals: First-degree AV block ST/T Wave abnormalities: normal Narrative Interpretation: Chronic left bundle branch block with no signs of acute ischemia  ____________________________________________  RADIOLOGY  Data reviewed by me concerning for pneumonia ____________________________________________   PROCEDURES  Procedure(s) performed: no  Procedures  Critical Care performed: no  Observation: no ____________________________________________   INITIAL IMPRESSION / ASSESSMENT AND PLAN / ED COURSE  Pertinent labs & imaging results that were available during my care of the patient were reviewed by me and considered in my medical decision making (see chart for details).  The patient is saturating in the mid 90s although in no respiratory distress.  Her chest x-ray is consistent with a right middle lobe pneumonia and she has failed outpatient management at this time she is taking 2 different antibiotics without improvement of her symptoms.  More concerning however is her mental status  and she clearly has delirium at this point which family reports is new.  The patient has fallen multiple times at home and continues to be a fall risk.  At this point she is failed outpatient management and has alteration in her mental status along with an elevated troponin level and I do believe she requires inpatient admission for IV fluids, IV antibiotics, and 24-hour nursing care.  The patient and family verbalized understanding and agreement with the plan.  I discussed the case with the hospitalist who is graciously agreed to admit the patient to his service.      ____________________________________________   FINAL CLINICAL IMPRESSION(S) / ED DIAGNOSES  Final diagnoses:  Delirium  Community acquired pneumonia of right middle lobe of lung ([place])  Elevated troponin      NEW MEDICATIONS STARTED DURING THIS VISIT:  This SmartLink is deprecated. Use  AVSMEDLIST instead to display the medication list for a patient.   Note:  This document was prepared using Dragon voice recognition software and may include unintentional dictation errors.     Darel Hong, MD 11/05/17 2245    Darel Hong, MD 11/06/17 [phone]

## 2017-11-05 NOTE — ED Notes (Signed)
Pt assisted to bathroom for urine specimen; pt remains A&O but is expressing fear over "all the water in the floor" when none is present; pt taken back to lobby to wait with her family; family updated on plan of care

## 2017-11-05 NOTE — ED Triage Notes (Signed)
Pt ambulatory to triage with aid of cane, no distress noted; pt accomp by family who reports pt seen Dr Caryl Comes 2 days ago and went from zpak to doxycycline for bilat pneumonia; having prod cough yellow sputum x 2 wks, at times brown with sinus congestion & pressure; pt reports discomfort to chest from coughing as well as HA; st hx aneurysm; denies fever; family st since yesterday having confusion; pt currently A&Ox3

## 2017-11-06 ENCOUNTER — Other Ambulatory Visit: Payer: Self-pay

## 2017-11-06 DIAGNOSIS — I2510 Atherosclerotic heart disease of native coronary artery without angina pectoris: Secondary | ICD-10-CM | POA: Diagnosis present

## 2017-11-06 DIAGNOSIS — F411 Generalized anxiety disorder: Secondary | ICD-10-CM | POA: Diagnosis present

## 2017-11-06 DIAGNOSIS — J181 Lobar pneumonia, unspecified organism: Secondary | ICD-10-CM | POA: Diagnosis present

## 2017-11-06 DIAGNOSIS — J189 Pneumonia, unspecified organism: Secondary | ICD-10-CM | POA: Diagnosis present

## 2017-11-06 DIAGNOSIS — R410 Disorientation, unspecified: Secondary | ICD-10-CM | POA: Diagnosis present

## 2017-11-06 DIAGNOSIS — Z8673 Personal history of transient ischemic attack (TIA), and cerebral infarction without residual deficits: Secondary | ICD-10-CM | POA: Diagnosis not present

## 2017-11-06 DIAGNOSIS — I252 Old myocardial infarction: Secondary | ICD-10-CM | POA: Diagnosis not present

## 2017-11-06 DIAGNOSIS — E876 Hypokalemia: Secondary | ICD-10-CM | POA: Diagnosis present

## 2017-11-06 DIAGNOSIS — I440 Atrioventricular block, first degree: Secondary | ICD-10-CM | POA: Diagnosis present

## 2017-11-06 DIAGNOSIS — B029 Zoster without complications: Secondary | ICD-10-CM | POA: Diagnosis not present

## 2017-11-06 DIAGNOSIS — I248 Other forms of acute ischemic heart disease: Secondary | ICD-10-CM | POA: Diagnosis present

## 2017-11-06 DIAGNOSIS — Z880 Allergy status to penicillin: Secondary | ICD-10-CM | POA: Diagnosis not present

## 2017-11-06 DIAGNOSIS — I447 Left bundle-branch block, unspecified: Secondary | ICD-10-CM | POA: Diagnosis present

## 2017-11-06 DIAGNOSIS — R296 Repeated falls: Secondary | ICD-10-CM | POA: Diagnosis present

## 2017-11-06 DIAGNOSIS — Z90710 Acquired absence of both cervix and uterus: Secondary | ICD-10-CM | POA: Diagnosis not present

## 2017-11-06 DIAGNOSIS — I10 Essential (primary) hypertension: Secondary | ICD-10-CM | POA: Diagnosis present

## 2017-11-06 DIAGNOSIS — Z9089 Acquired absence of other organs: Secondary | ICD-10-CM | POA: Diagnosis not present

## 2017-11-06 DIAGNOSIS — G9341 Metabolic encephalopathy: Secondary | ICD-10-CM | POA: Diagnosis present

## 2017-11-06 LAB — CBC
HEMATOCRIT: 35.7 % (ref 35.0–47.0)
HEMOGLOBIN: 12 g/dL (ref 12.0–16.0)
MCH: 29.5 pg (ref 26.0–34.0)
MCHC: 33.6 g/dL (ref 32.0–36.0)
MCV: 87.8 fL (ref 80.0–100.0)
Platelets: 333 10*3/uL (ref 150–440)
RBC: 4.06 MIL/uL (ref 3.80–5.20)
RDW: 13.5 % (ref 11.5–14.5)
WBC: 8.9 10*3/uL (ref 3.6–11.0)

## 2017-11-06 LAB — BASIC METABOLIC PANEL
Anion gap: 9 (ref 5–15)
BUN: 11 mg/dL (ref 6–20)
CHLORIDE: 106 mmol/L (ref 101–111)
CO2: 24 mmol/L (ref 22–32)
CREATININE: 0.71 mg/dL (ref 0.44–1.00)
Calcium: 9 mg/dL (ref 8.9–10.3)
GFR calc Af Amer: >60 mL/min (ref >60)
GFR calc non Af Amer: >60 mL/min (ref >60)
GLUCOSE: 110 mg/dL — AB (ref 65–99)
Potassium: 3 mmol/L — ABNORMAL LOW (ref 3.5–5.1)
SODIUM: 139 mmol/L (ref 135–145)

## 2017-11-06 LAB — GLUCOSE, CAPILLARY: Glucose-Capillary: 117 mg/dL — ABNORMAL HIGH (ref 65–99)

## 2017-11-06 LAB — LACTIC ACID, PLASMA: Lactic Acid, Venous: 0.9 mmol/L (ref 0.5–1.9)

## 2017-11-06 LAB — PROCALCITONIN

## 2017-11-06 LAB — TROPONIN I: Troponin I: 0.03 ng/mL (ref <0.03)

## 2017-11-06 MED ORDER — GUAIFENESIN-CODEINE 100-10 MG/5ML PO SOLN
10.0000 mL | Freq: Four times a day (QID) | ORAL | Status: DC | PRN
  Administered 2017-11-06 – 2017-11-07 (×2): 10 mL via ORAL
  Filled 2017-11-06 (×2): qty 10

## 2017-11-06 MED ORDER — SODIUM CHLORIDE 0.9 % IV SOLN
Freq: Once | INTRAVENOUS | Status: AC
  Administered 2017-11-06: 05:00:00 via INTRAVENOUS

## 2017-11-06 MED ORDER — DEXTROSE 5 % IV SOLN: 1.0000 g | INTRAVENOUS | Status: DC

## 2017-11-06 MED ORDER — HEPARIN SODIUM (PORCINE) 5000 UNIT/ML IJ SOLN
5000.0000 [IU] | Freq: Three times a day (TID) | INTRAMUSCULAR | Status: DC
  Administered 2017-11-06 – 2017-11-11 (×14): 5000 [IU] via SUBCUTANEOUS
  Filled 2017-11-06 (×15): qty 1

## 2017-11-06 MED ORDER — BISACODYL 5 MG PO TBEC
5.0000 mg | DELAYED_RELEASE_TABLET | Freq: Every day | ORAL | Status: DC | PRN
  Administered 2017-11-07: 10:00:00 5 mg via ORAL
  Filled 2017-11-06: qty 1

## 2017-11-06 MED ORDER — ALPRAZOLAM 0.25 MG PO TABS
0.2500 mg | ORAL_TABLET | Freq: Every evening | ORAL | Status: DC | PRN
  Administered 2017-11-06 – 2017-11-11 (×3): 0.25 mg via ORAL
  Filled 2017-11-06 (×3): qty 1

## 2017-11-06 MED ORDER — DILTIAZEM HCL ER 120 MG PO CP24
120.0000 mg | ORAL_CAPSULE | Freq: Every day | ORAL | Status: DC
  Administered 2017-11-06 – 2017-11-12 (×7): 120 mg via ORAL
  Filled 2017-11-06 (×7): qty 1

## 2017-11-06 MED ORDER — TRAZODONE HCL 50 MG PO TABS
25.0000 mg | ORAL_TABLET | Freq: Every evening | ORAL | Status: DC | PRN
  Administered 2017-11-08 – 2017-11-10 (×3): 25 mg via ORAL
  Filled 2017-11-06 (×3): qty 1

## 2017-11-06 MED ORDER — RAMIPRIL 10 MG PO CAPS
10.0000 mg | ORAL_CAPSULE | Freq: Two times a day (BID) | ORAL | Status: DC
  Administered 2017-11-06 – 2017-11-11 (×12): 10 mg via ORAL
  Filled 2017-11-06 (×16): qty 1

## 2017-11-06 MED ORDER — ACETAMINOPHEN 650 MG RE SUPP: 650.0000 mg | Freq: Four times a day (QID) | RECTAL | Status: DC | PRN

## 2017-11-06 MED ORDER — GUAIFENESIN ER 600 MG PO TB12
600.0000 mg | ORAL_TABLET | Freq: Two times a day (BID) | ORAL | Status: DC
  Administered 2017-11-06 – 2017-11-12 (×13): 600 mg via ORAL
  Filled 2017-11-06 (×13): qty 1

## 2017-11-06 MED ORDER — ONDANSETRON HCL 4 MG PO TABS: 4.0000 mg | ORAL_TABLET | Freq: Four times a day (QID) | ORAL | Status: DC | PRN

## 2017-11-06 MED ORDER — DEXTROSE 5 % IV SOLN
1.0000 g | INTRAVENOUS | Status: DC
  Administered 2017-11-06 – 2017-11-11 (×6): 1 g via INTRAVENOUS
  Filled 2017-11-06 (×8): qty 10

## 2017-11-06 MED ORDER — IPRATROPIUM-ALBUTEROL 0.5-2.5 (3) MG/3ML IN SOLN
3.0000 mL | Freq: Four times a day (QID) | RESPIRATORY_TRACT | Status: DC
  Administered 2017-11-06 – 2017-11-07 (×6): 3 mL via RESPIRATORY_TRACT
  Filled 2017-11-06 (×8): qty 3

## 2017-11-06 MED ORDER — CEFTRIAXONE SODIUM IN DEXTROSE 20 MG/ML IV SOLN
1.0000 g | INTRAVENOUS | Status: DC
  Filled 2017-11-06: qty 50

## 2017-11-06 MED ORDER — ESCITALOPRAM OXALATE 5 MG PO TABS
5.0000 mg | ORAL_TABLET | Freq: Every day | ORAL | Status: DC
  Administered 2017-11-06 – 2017-11-12 (×7): 5 mg via ORAL
  Filled 2017-11-06 (×7): qty 1

## 2017-11-06 MED ORDER — DEXTROSE 5 % IV SOLN: 500.0000 mg | INTRAVENOUS | Status: DC

## 2017-11-06 MED ORDER — HYDROCODONE-ACETAMINOPHEN 5-325 MG PO TABS
1.0000 | ORAL_TABLET | ORAL | Status: DC | PRN
  Administered 2017-11-07: 1 via ORAL
  Filled 2017-11-06: qty 1

## 2017-11-06 MED ORDER — DOCUSATE SODIUM 100 MG PO CAPS
100.0000 mg | ORAL_CAPSULE | Freq: Two times a day (BID) | ORAL | Status: DC
  Administered 2017-11-06 – 2017-11-12 (×13): 100 mg via ORAL
  Filled 2017-11-06 (×13): qty 1

## 2017-11-06 MED ORDER — ACETAMINOPHEN 325 MG PO TABS: 650.0000 mg | ORAL_TABLET | Freq: Four times a day (QID) | ORAL | Status: DC | PRN

## 2017-11-06 MED ORDER — POTASSIUM CHLORIDE 20 MEQ PO PACK
40.0000 meq | PACK | Freq: Once | ORAL | Status: AC
  Administered 2017-11-06: 40 meq via ORAL
  Filled 2017-11-06: qty 2

## 2017-11-06 MED ORDER — ONDANSETRON HCL 4 MG/2ML IJ SOLN: 4.0000 mg | Freq: Four times a day (QID) | INTRAMUSCULAR | Status: DC | PRN

## 2017-11-06 MED ORDER — BUSPIRONE HCL 5 MG PO TABS
5.0000 mg | ORAL_TABLET | Freq: Two times a day (BID) | ORAL | Status: DC
  Administered 2017-11-06 – 2017-11-12 (×13): 5 mg via ORAL
  Filled 2017-11-06 (×15): qty 1

## 2017-11-06 MED ORDER — DOXYCYCLINE HYCLATE 100 MG IV SOLR
100.0000 mg | Freq: Two times a day (BID) | INTRAVENOUS | Status: DC
  Administered 2017-11-06 – 2017-11-09 (×7): 100 mg via INTRAVENOUS
  Filled 2017-11-06 (×9): qty 100

## 2017-11-06 MED ORDER — BENZONATATE 100 MG PO CAPS: 200.0000 mg | ORAL_CAPSULE | Freq: Three times a day (TID) | ORAL | Status: DC | PRN

## 2017-11-06 MED ORDER — METOPROLOL SUCCINATE ER 25 MG PO TB24
25.0000 mg | ORAL_TABLET | Freq: Every day | ORAL | Status: DC
  Administered 2017-11-06 – 2017-11-12 (×7): 25 mg via ORAL
  Filled 2017-11-06 (×7): qty 1

## 2017-11-06 MED ORDER — BENZONATATE 100 MG PO CAPS
200.0000 mg | ORAL_CAPSULE | Freq: Three times a day (TID) | ORAL | Status: DC
  Administered 2017-11-06 – 2017-11-12 (×18): 200 mg via ORAL
  Filled 2017-11-06 (×18): qty 2

## 2017-11-06 NOTE — Progress Notes (Signed)
[place] at [facility]                                                                                                                                                                                  Patient Demographics   Michele [name], is a 84 y.o. female, DOB - 1924/02/25, ACZ:[id number]  Admit date - 11/05/2017   Admitting Physician Amelia Jo, MD  Outpatient Primary MD for the patient is Adin Hector, MD   LOS - 0  Subjective: Patient admitted with cough congestion and respiratory symptoms ongoing for the past 2 weeks has been treated with oral antibiotics with doxycycline and azithromycin Continue to be symptomatic therefore came to the ED.  She has been having some cough and congestion no chest pain no wheezing    Review of Systems:   CONSTITUTIONAL: No documented fever. No fatigue, weakness. No weight gain, no weight loss.  EYES: No blurry or double vision.  ENT: No tinnitus. No postnasal drip. No redness of the oropharynx.  RESPIRATORY: Positive cough, no wheeze, no hemoptysis. No dyspnea.  CARDIOVASCULAR: No chest pain. No orthopnea. No palpitations. No syncope.  GASTROINTESTINAL: No nausea, no vomiting or diarrhea. No abdominal pain. No melena or hematochezia.  GENITOURINARY: No dysuria or hematuria.  ENDOCRINE: No polyuria or nocturia. No heat or cold intolerance.  HEMATOLOGY: No anemia. No bruising. No bleeding.  INTEGUMENTARY: No rashes. No lesions.  MUSCULOSKELETAL: No arthritis. No swelling. No gout.  NEUROLOGIC: No numbness, tingling, or ataxia. No seizure-type activity.  PSYCHIATRIC: No anxiety. No insomnia. No ADD.    Vitals:   Vitals:   11/06/17 0223 11/06/17 0348 11/06/17 0945 11/06/17 1351  BP: (!) 155/85 (!) 153/62 (!) 171/63 137/60  Pulse: 81 69 81 72  Resp: 19 17 20    Temp:  98.3 F (36.8 C) 97.7 F (36.5 C) 97.8 F (36.6 C)  TempSrc: Oral Oral Oral Oral  SpO2: 100% 97% 99% 96%  Weight:  131 lb 8 oz (59.6 kg)     Height:  5\' 3"  (1.6 m)      Wt Readings from Last 3 Encounters:  11/06/17 131 lb 8 oz (59.6 kg)     Intake/Output Summary (Last 24 hours) at 11/06/2017 1358 Last data filed at 11/06/2017 1349 Gross per 24 hour  Intake 2205 ml  Output -  Net 2205 ml    Physical Exam:   GENERAL: Pleasant-appearing in no apparent distress.  HEAD, EYES, EARS, NOSE AND THROAT: Atraumatic, normocephalic. Extraocular muscles are intact. Pupils equal and reactive to light. Sclerae anicteric. No conjunctival injection. No oro-pharyngeal erythema.  NECK: Supple. There is no jugular venous distention. No bruits, no  lymphadenopathy, no thyromegaly.  HEART: Regular rate and rhythm,. No murmurs, no rubs, no clicks.  LUNGS: Rhonchus breath sounds bilaterally no accessory muscle usage ABDOMEN: Soft, flat, nontender, nondistended. Has good bowel sounds. No hepatosplenomegaly appreciated.  EXTREMITIES: No evidence of any cyanosis, clubbing, or peripheral edema.  +2 pedal and radial pulses bilaterally.  NEUROLOGIC: The patient is alert, awake, and oriented x3 with no focal motor or sensory deficits appreciated bilaterally.  SKIN: Moist and warm with no rashes appreciated.  Psych: Not anxious, depressed LN: No inguinal LN enlargement    Antibiotics   Anti-infectives (From admission, onward)   Start     Dose/Rate Route Frequency Ordered Stop   11/06/17 1000  cefTRIAXone (ROCEPHIN) 1 g in dextrose 5 % 50 mL IVPB - Premix  Status:  Discontinued     1 g 100 mL/hr over 30 Minutes Intravenous Every 24 hours 11/06/17 0243 11/06/17 0432   11/06/17 1000  azithromycin (ZITHROMAX) 500 mg in dextrose 5 % 250 mL IVPB  Status:  Discontinued     500 mg 250 mL/hr over 60 Minutes Intravenous Every 24 hours 11/06/17 0244 11/06/17 0332   11/06/17 1000  cefTRIAXone (ROCEPHIN) 1 g in dextrose 5 % 50 mL IVPB     1 g 100 mL/hr over 30 Minutes Intravenous Every 24 hours 11/06/17 0432     11/06/17 0345  doxycycline (VIBRAMYCIN) 100 mg  in dextrose 5 % 250 mL IVPB     100 mg 125 mL/hr over 120 Minutes Intravenous Every 12 hours 11/06/17 0332     11/06/17 0015  cefTRIAXone (ROCEPHIN) 1 g in dextrose 5 % 50 mL IVPB  Status:  Discontinued     1 g 100 mL/hr over 30 Minutes Intravenous Every 24 hours 11/06/17 0007 11/06/17 0028   11/06/17 0015  azithromycin (ZITHROMAX) 500 mg in dextrose 5 % 250 mL IVPB  Status:  Discontinued     500 mg 250 mL/hr over 60 Minutes Intravenous Every 24 hours 11/06/17 0007 11/06/17 0028   11/05/17 2245  cefTRIAXone (ROCEPHIN) 1 g in dextrose 5 % 50 mL IVPB - Premix     1 g 100 mL/hr over 30 Minutes Intravenous  Once 11/05/17 2241 11/05/17 2352   11/05/17 2245  azithromycin (ZITHROMAX) 500 mg in dextrose 5 % 250 mL IVPB     500 mg 250 mL/hr over 60 Minutes Intravenous  Once 11/05/17 2241 11/06/17 0031      Medications   Scheduled Meds: . busPIRone  5 mg Oral BID  . diltiazem  120 mg Oral Daily  . docusate sodium  100 mg Oral BID  . escitalopram  5 mg Oral Daily  . heparin  5,000 Units Subcutaneous Q8H  . ipratropium-albuterol  3 mL Nebulization Q6H  . metoprolol succinate  25 mg Oral Daily  . ramipril  10 mg Oral BID   Continuous Infusions: . cefTRIAXone (ROCEPHIN) IVPB 1 gram/50 mL D5W Stopped (11/06/17 0959)  . doxycycline (VIBRAMYCIN) IV Stopped (11/06/17 0855)   PRN Meds:.acetaminophen **OR** acetaminophen, ALPRAZolam, benzonatate, bisacodyl, guaiFENesin-codeine, HYDROcodone-acetaminophen, ondansetron **OR** ondansetron (ZOFRAN) IV, traZODone   Data Review:   Micro Results Recent Results (from the past 240 hour(s))  Blood Culture (routine x 2)     Status: None (Preliminary result)   Collection Time: 11/05/17 11:05 PM  Result Value Ref Range Status   Specimen Description BLOOD LEFT ANTECUBITAL  Final   Special Requests   Final    BOTTLES DRAWN AEROBIC AND ANAEROBIC Blood Culture adequate volume  Culture   Final    NO GROWTH < 12 HOURS Performed at [facility],  [address] [place], [place] [address]    Report Status PENDING  Incomplete  Blood Culture (routine x 2)     Status: None (Preliminary result)   Collection Time: 11/05/17 11:05 PM  Result Value Ref Range Status   Specimen Description BLOOD BLOOD RIGHT FOREARM  Final   Special Requests   Final    BOTTLES DRAWN AEROBIC AND ANAEROBIC Blood Culture adequate volume   Culture   Final    NO GROWTH < 12 HOURS Performed at [facility], [address] [place], [place] [address]    Report Status PENDING  Incomplete    Radiology Reports Dg Chest 2 View  Result Date: 11/05/2017 CLINICAL DATA:  Patient seen Dr Caryl Comes 2 days ago and went from zpak to doxycycline for bilateral pneumonia; having productive cough yellow sputum x 2 weeks, at times brown with sinus congestion AND pressure; patient reports discomfort to chest from coughing as well as headache ; history of aneurysm EXAM: CHEST  2 VIEW COMPARISON:  11/27/2016 FINDINGS: Cardiac enlargement. No vascular congestion or edema. There is diffuse interstitial pattern to the lungs with peribronchial thickening likely representing diffuse bronchitis. This could be either acute or chronic. There is a focal irregular opacity over the right mid lung. This could represent focal pneumonia, mucous plugging, or a parenchymal nodule. This is new since previous study. Recommend follow-up after treatment of acute process. Calcified granulomas in the left lung. Calcified and tortuous aorta. Degenerative changes throughout the spine. IMPRESSION: Bronchitic changes in the lungs may represent acute or chronic bronchitis. Focal opacity in the right mid lung could represent focal pneumonia, mucous plugging, or parenchymal nodule. Followup PA and lateral chest X-ray is recommended in 3-4 weeks following trial of appropriate clinical therapy to ensure resolution and exclude underlying malignancy. Bronchiectasis. Aortic atherosclerosis. Electronically Signed   By:  Lucienne Capers M.D.   On: 11/05/2017 20:46   Ct Head Wo Contrast  Result Date: 11/05/2017 CLINICAL DATA:  Acute confusion.  Headache. EXAM: CT HEAD WITHOUT CONTRAST TECHNIQUE: Contiguous axial images were obtained from the base of the skull through the vertex without intravenous contrast. 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 No acute hemorrhage or evidence of acute ischemia. Age related atrophy and chronic small vessel ischemic change. Remote lacunar infarcts in the right cerebellum. No hydrocephalus, mass effect, or midline shift. No subdural or extra-axial fluid collection. Vascular: No hyperdense vessel. 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 Skullbase atherosclerosis. Skull: No fracture or focal lesion. Sinuses/Orbits: Mucosal thickening with fluid level on the left maxillary sinus. Opacification of scattered left ethmoid air cells. The mastoid air cells are clear. Other: None. IMPRESSION: 1.  No acute intracranial abnormality. 2. Remote infract in the right parietooccipital lobe. Age related atrophy and chronic small vessel ischemia. Electronically Signed   By: Jeb Levering M.D.   On: 11/05/2017 21:09     CBC Recent Labs  Lab 11/05/17 2054 11/06/17 0414  WBC 10.9 8.9  HGB 12.5 12.0  HCT 36.4 35.7  PLT 394 333  MCV 87.4 87.8  MCH 29.9 29.5  MCHC 34.2 33.6  RDW 13.5 13.5    Chemistries  Recent Labs  Lab 11/05/17 2054 11/06/17 0414  NA 136 139  K 3.0* 3.0*  CL  100* 106  CO2 24 24  GLUCOSE 136* 110*  BUN 14 11  CREATININE 0.89 0.71  CALCIUM 9.5 9.0   ------------------------------------------------------------------------------------------------------------------ estimated creatinine clearance is 37.9 mL/min (by C-G formula based on SCr of 0.71  mg/dL). ------------------------------------------------------------------------------------------------------------------ No results for input(s): HGBA1C in the last 72 hours. ------------------------------------------------------------------------------------------------------------------ No results for input(s): CHOL, HDL, LDLCALC, TRIG, CHOLHDL, LDLDIRECT in the last 72 hours. ------------------------------------------------------------------------------------------------------------------ No results for input(s): TSH, T4TOTAL, T3FREE, THYROIDAB in the last 72 hours.  Invalid input(s): FREET3 ------------------------------------------------------------------------------------------------------------------ No results for input(s): VITAMINB12, FOLATE, FERRITIN, TIBC, IRON, RETICCTPCT in the last 72 hours.  Coagulation profile No results for input(s): INR, PROTIME in the last 168 hours.  No results for input(s): DDIMER in the last 72 hours.  Cardiac Enzymes Recent Labs  Lab 11/05/17 2054 11/06/17 0612  TROPONINI 0.04* 0.03*   ------------------------------------------------------------------------------------------------------------------ Invalid input(s): POCBNP    Assessment & Plan   IMPRESSION AND PLAN: 1.  Shortness of breath cough and congestion possibly due to viral pneumonia patient's pro-calcitonin level is very low, however due to persistent symptoms we will treat her with oral Levaquin, will add antitussives mucolytic's to her regimen 2.  Mild acute encephalopathy due to illness now improved 3.    Elevated troponin due to demand ischemia no further workup needed at this point 4.    Hypokalemia being replaced 5.  Generalized anxiety continue alprazolam and BuSpar and Lexapro 6.  Essential hypertension continue therapy with metoprolol and ramipril       Code Status Orders  (From admission, onward)        Start     Ordered   11/06/17 0353  Full code   Continuous     11/06/17 0352    Code Status History    Date Active Date Inactive Code Status Order ID Comments User Context   This patient has a current code status but no historical code status.    Advance Directive Documentation     Most Recent Value  Type of Advance Directive  Healthcare Power of Attorney, Living will  Pre-existing out of facility DNR order (yellow form or pink MOST form)  No data  "MOST" Form in Place?  No data           Consults none   DVT Prophylaxis  Lovenox   Lab Results  Component Value Date   PLT 333 11/06/2017     Time Spent in minutes 35 minutes  Greater than 50% of time spent in care coordination and counseling patient regarding the condition and plan of care.   Dustin Flock M.D on 11/06/2017 at 1:58 PM  Between 7am to 6pm - Pager - [phone]  After 6pm go to [email or web address] - password [email or web address] [facility]   Office  [phone]

## 2017-11-06 NOTE — Progress Notes (Signed)
Pharmacy Antibiotic Note  Michele [name] is a 84 y.o. female admitted on 11/05/2017 with pneumonia.  Pharmacy has been consulted for azithromycin/ceftriaxone dosing.  Plan: Patient received azithromycin 500 mg and ceftriaxone 1g IV x 1 in ED  Will start azithromycin 500 mg IV daily for 4 more days Will start ceftriaxone 1g IV daily for 4 - 6 more days  Height: 5\' 3"  (160 cm) Weight: 131 lb (59.4 kg) IBW/kg (Calculated) : 52.4  Temp (24hrs), Avg:98.2 F (36.8 C), Min:98.2 F (36.8 C), Max:98.2 F (36.8 C)  Recent Labs  Lab 11/05/17 2054 11/05/17 2305  WBC 10.9  --   CREATININE 0.89  --   LATICACIDVEN  --  1.1    Estimated Creatinine Clearance: 34.1 mL/min (by C-G formula based on SCr of 0.89 mg/dL).    Allergies  Allergen Reactions  . Penicillins Rash    Has patient had a PCN reaction causing immediate rash, facial/tongue/throat swelling, SOB or lightheadedness with hypotension: Yes Has patient had a PCN reaction causing severe rash involving mucus membranes or skin necrosis: No Has patient had a PCN reaction that required hospitalization: No Has patient had a PCN reaction occurring within the last 10 years: No If all of the above answers are "NO", then may proceed with Cephalosporin use.    Thank you for allowing pharmacy to be a part of this patient's care.  Tobie Lords, PharmD, BCPS Clinical Pharmacist 11/06/2017

## 2017-11-06 NOTE — ED Notes (Signed)
Spoke with Admitting MD about putting admission orders in.

## 2017-11-06 NOTE — H&P (Signed)
[facility] [place] NAME: Michele [name]    MR#:  [id number]  DATE OF BIRTH:  Nov 11, 1923  DATE OF ADMISSION:  11/05/2017  PRIMARY CARE PHYSICIAN: Adin Hector, MD   REQUESTING/REFERRING PHYSICIAN:   CHIEF COMPLAINT:   Chief Complaint  Patient presents with  . Cough  . Nasal Congestion    HISTORY OF PRESENT ILLNESS: Michele [name]  is a 84 y.o. female with a known history of coronary artery disease and stroke in the past.  Patient was brought to emergency room by the family for 1 week history of cough and chest congestion gradually getting worse despite even using antibiotics as outpatient.  Patient denies any fever or chills however she has chest pain with cough.  No sore shortness of breath with exertion going on for the past week.  Pt has been gradually weaker and had several falls at home per family.  While in the emergency room she was noted to troponin level at 0.04.  Michele [name] is admitted for further evaluation and treatment.  PAST MEDICAL HISTORY:   Past Medical History:  Diagnosis Date  . MI (myocardial infarction) ([place])   . Stroke [facility])     PAST SURGICAL HISTORY:  Past Surgical History:  Procedure Laterality Date  . ABDOMINAL HYSTERECTOMY    . APPENDECTOMY    . ROTATOR CUFF REPAIR Left     SOCIAL HISTORY:  Social History   Tobacco Use  . Smoking status: Never Smoker  . Smokeless tobacco: Never Used  Substance Use Topics  . Alcohol use: No    Frequency: Never    FAMILY HISTORY: No family history on file.  DRUG ALLERGIES:  Allergies  Allergen Reactions  . Penicillins Rash    Has patient had a PCN reaction causing immediate rash, facial/tongue/throat swelling, SOB or lightheadedness with hypotension: Yes Has patient had a PCN reaction causing severe rash involving mucus membranes or skin necrosis: No Has patient had a PCN reaction that required hospitalization: No Has patient had a PCN reaction occurring within  the last 10 years: No If all of the above answers are "NO", then may proceed with Cephalosporin use.    REVIEW OF SYSTEMS:   CONSTITUTIONAL: No fever, but pt c/o fatigue and generalized weakness.  EYES: No blurred or double vision.  EARS, NOSE, AND THROAT: No tinnitus or ear pain.  RESPIRATORY: PT c/o productive cough and shortness of breath w/ exertion. No wheezing or hemoptysis.  CARDIOVASCULAR: No chest pain, orthopnea, edema.  GASTROINTESTINAL: No nausea, vomiting, diarrhea or abdominal pain.  GENITOURINARY: No dysuria, hematuria.  ENDOCRINE: No polyuria, nocturia,  HEMATOLOGY: Pt c/o easy bruising. SKIN: No rash or lesion. MUSCULOSKELETAL: There is chronic joint pain and osteoarthritis.   NEUROLOGIC: No focal  weakness.  PSYCHIATRY: No anxiety or depression.   MEDICATIONS AT HOME:  Prior to Admission medications   Medication Sig Start Date End Date Taking? Authorizing Provider  ALPRAZolam Duanne Moron) 0.25 MG tablet Take 0.25 mg by mouth at bedtime as needed for anxiety.   Yes [provider]  benzonatate (TESSALON) 200 MG capsule Take 200 mg by mouth 3 (three) times daily as needed for cough.   Yes [provider]  busPIRone (BUSPAR) 5 MG tablet Take 5 mg by mouth 2 (two) times daily.   Yes [provider]  diltiazem (DILACOR XR) 120 MG 24 hr capsule Take 120 mg by mouth daily.   Yes [provider]  doxycycline (VIBRA-TABS) 100  MG tablet Take 100 mg by mouth 2 (two) times daily.   Yes [provider]  escitalopram (LEXAPRO) 5 MG tablet Take 5 mg by mouth daily.   Yes [provider]  metoprolol succinate (TOPROL-XL) 25 MG 24 hr tablet Take 25 mg by mouth daily.   Yes [provider]  ramipril (ALTACE) 10 MG capsule Take 10 mg by mouth 2 (two) times daily.   Yes [provider]      PHYSICAL EXAMINATION:   VITAL SIGNS: Blood pressure (!) 153/62, pulse 69, temperature 98.3 F (36.8 C), temperature source  Oral, resp. rate 17, height 5\' 3"  (1.6 m), weight 59.4 kg (131 lb), SpO2 97 %.  GENERAL:  84 y.o.-year-old patient lying in the bed; she looks weak and tired.  EYES: Pupils equal, round, reactive to light and accommodation. No scleral icterus. Extraocular muscles intact.  HEENT: Head atraumatic, normocephalic. Oropharynx and nasopharynx clear.  NECK:  Supple, no jugular venous distention. No thyroid enlargement, no tenderness.  LUNGS: Normal breath sounds bilaterally, no wheezing, rales,rhonchi or crepitation. No use of accessory muscles of respiration.  CARDIOVASCULAR: S1, S2 normal. No murmurs, rubs, or gallops.  ABDOMEN: Soft, nontender, nondistended. Bowel sounds present. No organomegaly or mass.  EXTREMITIES: No pedal edema, cyanosis, or clubbing.  NEUROLOGIC: Cranial nerves II through XII are intact. Muscle strength 5/5 in all extremities. Sensation intact. Gait not checked.  PSYCHIATRIC: The patient is alert and oriented x 2.  SKIN: No obvious rash, lesion, or ulcer.   LABORATORY PANEL:   CBC Recent Labs  Lab 11/05/17 2054  WBC 10.9  HGB 12.5  HCT 36.4  PLT 394  MCV 87.4  MCH 29.9  MCHC 34.2  RDW 13.5   ------------------------------------------------------------------------------------------------------------------  Chemistries  Recent Labs  Lab 11/05/17 2054  NA 136  K 3.0*  CL 100*  CO2 24  GLUCOSE 136*  BUN 14  CREATININE 0.89  CALCIUM 9.5   ------------------------------------------------------------------------------------------------------------------ estimated creatinine clearance is 34.1 mL/min (by C-G formula based on SCr of 0.89 mg/dL). ------------------------------------------------------------------------------------------------------------------ No results for input(s): TSH, T4TOTAL, T3FREE, THYROIDAB in the last 72 hours.  Invalid input(s): FREET3   Coagulation profile No results for input(s): INR, PROTIME in the last 168  hours. ------------------------------------------------------------------------------------------------------------------- No results for input(s): DDIMER in the last 72 hours. -------------------------------------------------------------------------------------------------------------------  Cardiac Enzymes Recent Labs  Lab 11/05/17 2054  TROPONINI 0.04*   ------------------------------------------------------------------------------------------------------------------ Invalid input(s): POCBNP  ---------------------------------------------------------------------------------------------------------------  Urinalysis    Component Value Date/Time   COLORURINE AMBER (A) 11/05/2017 2055   APPEARANCEUR CLOUDY (A) 11/05/2017 2055   LABSPEC 1.021 11/05/2017 2055   PHURINE 5.0 11/05/2017 2055   GLUCOSEU NEGATIVE 11/05/2017 2055   HGBUR NEGATIVE 11/05/2017 2055   BILIRUBINUR NEGATIVE 11/05/2017 2055   [place] NEGATIVE 11/05/2017 2055   PROTEINUR 30 (A) 11/05/2017 2055   NITRITE NEGATIVE 11/05/2017 2055   LEUKOCYTESUR MODERATE (A) 11/05/2017 2055     RADIOLOGY: Dg Chest 2 View  Result Date: 11/05/2017 CLINICAL DATA:  Patient seen Dr Caryl Comes 2 days ago and went from zpak to doxycycline for bilateral pneumonia; having productive cough yellow sputum x 2 weeks, at times brown with sinus congestion AND pressure; patient reports discomfort to chest from coughing as well as headache ; history of aneurysm EXAM: CHEST  2 VIEW COMPARISON:  11/27/2016 FINDINGS: Cardiac enlargement. No vascular congestion or edema. There is diffuse interstitial pattern to the lungs with peribronchial thickening likely representing diffuse bronchitis. This could be either acute or chronic. There is a focal irregular opacity  over the right mid lung. This could represent focal pneumonia, mucous plugging, or a parenchymal nodule. This is new since previous study. Recommend follow-up after treatment of acute process.  Calcified granulomas in the left lung. Calcified and tortuous aorta. Degenerative changes throughout the spine. IMPRESSION: Bronchitic changes in the lungs may represent acute or chronic bronchitis. Focal opacity in the right mid lung could represent focal pneumonia, mucous plugging, or parenchymal nodule. Followup PA and lateral chest X-ray is recommended in 3-4 weeks following trial of appropriate clinical therapy to ensure resolution and exclude underlying malignancy. Bronchiectasis. Aortic atherosclerosis. Electronically Signed   By: Lucienne Capers M.D.   On: 11/05/2017 20:46   Ct Head Wo Contrast  Result Date: 11/05/2017 CLINICAL DATA:  Acute confusion.  Headache. EXAM: CT HEAD WITHOUT CONTRAST TECHNIQUE: Contiguous axial images were obtained from the base of the skull through the vertex without intravenous contrast. 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 No acute hemorrhage or evidence of acute ischemia. Age related atrophy and chronic small vessel ischemic change. Remote lacunar infarcts in the right cerebellum. No hydrocephalus, mass effect, or midline shift. No subdural or extra-axial fluid collection. Vascular: No hyperdense vessel. 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 Skullbase atherosclerosis. Skull: No fracture or focal lesion. Sinuses/Orbits: Mucosal thickening with fluid level on the left maxillary sinus. Opacification of scattered left ethmoid air cells. The mastoid air cells are clear. Other: None. IMPRESSION: 1.  No acute intracranial abnormality. 2. Remote infract in the right parietooccipital lobe. Age related atrophy and chronic small vessel ischemia. Electronically Signed   By: Jeb Levering M.D.   On: 11/05/2017 21:09    EKG: Orders placed or performed during the hospital encounter of 11/05/17  . ED EKG  . ED EKG     IMPRESSION AND PLAN: 1. CAP, failed po outpt tx. Will start iv abx and neb tx. 2. Delirium, likely related to PNA, will cont to monitor clinically closely. Brain CT showed no acute changes.  3.  Minimal elevation in troponin level likely related to pneumonia; will monitor troponin level. 4.  Hypertension we will restart home medications.  All the records are reviewed and case discussed with ED provider. Management plans discussed with the patient, family and they are in agreement.  CODE STATUS:    Code Status Orders  (From admission, onward)        Start     Ordered   11/06/17 0353  Full code  Continuous     11/06/17 0352    Code Status History    Date Active Date Inactive Code Status Order ID Comments User Context   This patient has a current code status but no historical code status.    Advance Directive Documentation     Most Recent Value  Type of Advance Directive  Healthcare Power of Attorney, Living will  Pre-existing out of facility DNR order (yellow form or pink MOST form)  No data  "MOST" Form in Place?  No data       TOTAL TIME TAKING CARE OF THIS PATIENT: 35 minutes.    Amelia Jo M.D on 11/06/2017 at 4:12 AM  Between 7am to 6pm - Pager - [phone]  After 6pm go to [email or web address] - password [email or web address] [facility]  [place]  Office  [phone]  CC: Primary care physician; Adin Hector, MD

## 2017-11-07 LAB — BASIC METABOLIC PANEL
ANION GAP: 7 (ref 5–15)
BUN: <5 mg/dL — ABNORMAL LOW (ref 6–20)
CHLORIDE: 108 mmol/L (ref 101–111)
CO2: 25 mmol/L (ref 22–32)
Calcium: 9.1 mg/dL (ref 8.9–10.3)
Creatinine, Ser: 0.61 mg/dL (ref 0.44–1.00)
GFR calc Af Amer: >60 mL/min (ref >60)
Glucose, Bld: 119 mg/dL — ABNORMAL HIGH (ref 65–99)
POTASSIUM: 3.4 mmol/L — AB (ref 3.5–5.1)
Sodium: 140 mmol/L (ref 135–145)

## 2017-11-07 LAB — GLUCOSE, CAPILLARY: Glucose-Capillary: 101 mg/dL — ABNORMAL HIGH (ref 65–99)

## 2017-11-07 LAB — MAGNESIUM: Magnesium: 1.6 mg/dL — ABNORMAL LOW (ref 1.7–2.4)

## 2017-11-07 MED ORDER — POTASSIUM CHLORIDE CRYS ER 20 MEQ PO TBCR
20.0000 meq | EXTENDED_RELEASE_TABLET | Freq: Once | ORAL | Status: AC
  Administered 2017-11-07: 17:00:00 20 meq via ORAL
  Filled 2017-11-07: qty 1

## 2017-11-07 NOTE — Plan of Care (Signed)
  Progressing Education: Knowledge of General Education information will improve 11/07/2017 0749 - Progressing by Rowe Robert, RN Health Behavior/Discharge Planning: Ability to manage health-related needs will improve 11/07/2017 0749 - Progressing by Rowe Robert, RN Note Cont with some confusion at intevals. Alert / responsive.  No resp distress.  Clinical Measurements: Ability to maintain clinical measurements within normal limits will improve 11/07/2017 0749 - Progressing by Rowe Robert, RN Will remain free from infection 11/07/2017 0749 - Progressing by Rowe Robert, RN Note Abx cont Diagnostic test results will improve 11/07/2017 0749 - Progressing by Rowe Robert, RN Respiratory complications will improve 11/07/2017 0749 - Progressing by Rowe Robert, RN Cardiovascular complication will be avoided 11/07/2017 0749 - Progressing by Rowe Robert, RN Activity: Risk for activity intolerance will decrease 11/07/2017 0749 - Progressing by Rowe Robert, RN Nutrition: Adequate nutrition will be maintained 11/07/2017 0749 - Progressing by Rowe Robert, RN Coping: Level of anxiety will decrease 11/07/2017 0749 - Progressing by Rowe Robert, RN Elimination: Will not experience complications related to bowel motility 11/07/2017 0749 - Progressing by Rowe Robert, RN Will not experience complications related to urinary retention 11/07/2017 0749 - Progressing by Rowe Robert, RN Pain Managment: General experience of comfort will improve 11/07/2017 0749 - Progressing by Rowe Robert, RN Safety: Ability to remain free from injury will improve 11/07/2017 0749 - Progressing by Rowe Robert, RN Skin Integrity: Risk for impaired skin integrity will decrease 11/07/2017 0749 - Progressing by Rowe Robert, RN

## 2017-11-07 NOTE — Progress Notes (Signed)
Spoke with patient's son (Gene Rayfield). He's extremely concerned about patient safety because patient lives at home alone. He has stated that patient cannot be discharged without home health services in place.

## 2017-11-07 NOTE — Progress Notes (Signed)
[place] [facility]                                                                                                                                                                                  Patient Demographics   Michele [name], is a 84 y.o. female, DOB - 1924-03-27, TDD:[id number]  Admit date - 11/05/2017   Admitting Physician Amelia Jo, MD  Outpatient Primary MD for the patient is Adin Hector, MD   LOS - 1  Subjective: Patient states that her symptoms have improved.  Cough improved  Review of Systems:   CONSTITUTIONAL: No documented fever. No fatigue, weakness. No weight gain, no weight loss.  EYES: No blurry or double vision.  ENT: No tinnitus. No postnasal drip. No redness of the oropharynx.  RESPIRATORY: Positive cough, no wheeze, no hemoptysis.  No dyspnea.  CARDIOVASCULAR: No chest pain. No orthopnea. No palpitations. No syncope.  GASTROINTESTINAL: No nausea, no vomiting or diarrhea. No abdominal pain. No melena or hematochezia.  GENITOURINARY: No dysuria or hematuria.  ENDOCRINE: No polyuria or nocturia. No heat or cold intolerance.  HEMATOLOGY: No anemia. No bruising. No bleeding.  INTEGUMENTARY: No rashes. No lesions.  MUSCULOSKELETAL: No arthritis. No swelling. No gout.  NEUROLOGIC: No numbness, tingling, or ataxia. No seizure-type activity.  PSYCHIATRIC: No anxiety. No insomnia. No ADD.    Vitals:   Vitals:   11/06/17 2149 11/07/17 0440 11/07/17 0850 11/07/17 1401  BP: (!) 172/62 (!) 163/73 (!) 164/88 (!) 134/91  Pulse: 70 73 76 60  Resp: 16 16 20 18   Temp: 97.7 F (36.5 C) 98.1 F (36.7 C) (!) 97.4 F (36.3 C) 97.9 F (36.6 C)  TempSrc:  Oral Oral Axillary  SpO2: 99% 94% 96% 94%  Weight:  130 lb 6.4 oz (59.1 kg)    Height:        Wt Readings from Last 3 Encounters:  11/07/17 130 lb 6.4 oz (59.1 kg)     Intake/Output Summary (Last 24 hours) at 11/07/2017 1442 Last data filed at 11/07/2017 1040 Gross per 24 hour   Intake 960 ml  Output 101 ml  Net 859 ml    Physical Exam:   GENERAL: Pleasant-appearing in no apparent distress.  HEAD, EYES, EARS, NOSE AND THROAT: Atraumatic, normocephalic. Extraocular muscles are intact. Pupils equal and reactive to light. Sclerae anicteric. No conjunctival injection. No oro-pharyngeal erythema.  NECK: Supple. There is no jugular venous distention. No bruits, no lymphadenopathy, no thyromegaly.  HEART: Regular rate and rhythm,. No murmurs, no rubs, no clicks.  LUNGS: Rhonchus breath sounds bilaterally no accessory muscle usage ABDOMEN: Soft, flat, nontender, nondistended. Has good bowel sounds. No  hepatosplenomegaly appreciated.  EXTREMITIES: No evidence of any cyanosis, clubbing, or peripheral edema.  +2 pedal and radial pulses bilaterally.  NEUROLOGIC: The patient is alert, awake, and oriented x3 with no focal motor or sensory deficits appreciated bilaterally.  SKIN: Moist and warm with no rashes appreciated.  Psych: Not anxious, depressed LN: No inguinal LN enlargement    Antibiotics   Anti-infectives (From admission, onward)   Start     Dose/Rate Route Frequency Ordered Stop   11/06/17 1000  cefTRIAXone (ROCEPHIN) 1 g in dextrose 5 % 50 mL IVPB - Premix  Status:  Discontinued     1 g 100 mL/hr over 30 Minutes Intravenous Every 24 hours 11/06/17 0243 11/06/17 0432   11/06/17 1000  azithromycin (ZITHROMAX) 500 mg in dextrose 5 % 250 mL IVPB  Status:  Discontinued     500 mg 250 mL/hr over 60 Minutes Intravenous Every 24 hours 11/06/17 0244 11/06/17 0332   11/06/17 1000  cefTRIAXone (ROCEPHIN) 1 g in dextrose 5 % 50 mL IVPB     1 g 100 mL/hr over 30 Minutes Intravenous Every 24 hours 11/06/17 0432     11/06/17 0345  doxycycline (VIBRAMYCIN) 100 mg in dextrose 5 % 250 mL IVPB     100 mg 125 mL/hr over 120 Minutes Intravenous Every 12 hours 11/06/17 0332     11/06/17 0015  cefTRIAXone (ROCEPHIN) 1 g in dextrose 5 % 50 mL IVPB  Status:  Discontinued     1  g 100 mL/hr over 30 Minutes Intravenous Every 24 hours 11/06/17 0007 11/06/17 0028   11/06/17 0015  azithromycin (ZITHROMAX) 500 mg in dextrose 5 % 250 mL IVPB  Status:  Discontinued     500 mg 250 mL/hr over 60 Minutes Intravenous Every 24 hours 11/06/17 0007 11/06/17 0028   11/05/17 2245  cefTRIAXone (ROCEPHIN) 1 g in dextrose 5 % 50 mL IVPB - Premix     1 g 100 mL/hr over 30 Minutes Intravenous  Once 11/05/17 2241 11/05/17 2352   11/05/17 2245  azithromycin (ZITHROMAX) 500 mg in dextrose 5 % 250 mL IVPB     500 mg 250 mL/hr over 60 Minutes Intravenous  Once 11/05/17 2241 11/06/17 0031      Medications   Scheduled Meds: . benzonatate  200 mg Oral TID  . busPIRone  5 mg Oral BID  . diltiazem  120 mg Oral Daily  . docusate sodium  100 mg Oral BID  . escitalopram  5 mg Oral Daily  . guaiFENesin  600 mg Oral BID  . heparin  5,000 Units Subcutaneous Q8H  . ipratropium-albuterol  3 mL Nebulization Q6H  . metoprolol succinate  25 mg Oral Daily  . ramipril  10 mg Oral BID   Continuous Infusions: . cefTRIAXone (ROCEPHIN) IVPB 1 gram/50 mL D5W Stopped (11/07/17 1001)  . doxycycline (VIBRAMYCIN) IV Stopped (11/07/17 9562)   PRN Meds:.acetaminophen **OR** acetaminophen, ALPRAZolam, bisacodyl, guaiFENesin-codeine, HYDROcodone-acetaminophen, ondansetron **OR** ondansetron (ZOFRAN) IV, traZODone   Data Review:   Micro Results Recent Results (from the past 240 hour(s))  Blood Culture (routine x 2)     Status: None (Preliminary result)   Collection Time: 11/05/17 11:05 PM  Result Value Ref Range Status   Specimen Description BLOOD LEFT ANTECUBITAL  Final   Special Requests   Final    BOTTLES DRAWN AEROBIC AND ANAEROBIC Blood Culture adequate volume   Culture   Final    NO GROWTH 2 DAYS Performed at [facility], [address] [place], [place] [address]    Report Status PENDING  Incomplete  Blood Culture (routine x 2)     Status: None (Preliminary result)   Collection  Time: 11/05/17 11:05 PM  Result Value Ref Range Status   Specimen Description BLOOD BLOOD RIGHT FOREARM  Final   Special Requests   Final    BOTTLES DRAWN AEROBIC AND ANAEROBIC Blood Culture adequate volume   Culture   Final    NO GROWTH 2 DAYS Performed at [facility], [address] [place], [place] [address]    Report Status PENDING  Incomplete    Radiology Reports Dg Chest 2 View  Result Date: 11/05/2017 CLINICAL DATA:  Patient seen Dr Caryl Comes 2 days ago and went from zpak to doxycycline for bilateral pneumonia; having productive cough yellow sputum x 2 weeks, at times brown with sinus congestion AND pressure; patient reports discomfort to chest from coughing as well as headache ; history of aneurysm EXAM: CHEST  2 VIEW COMPARISON:  11/27/2016 FINDINGS: Cardiac enlargement. No vascular congestion or edema. There is diffuse interstitial pattern to the lungs with peribronchial thickening likely representing diffuse bronchitis. This could be either acute or chronic. There is a focal irregular opacity over the right mid lung. This could represent focal pneumonia, mucous plugging, or a parenchymal nodule. This is new since previous study. Recommend follow-up after treatment of acute process. Calcified granulomas in the left lung. Calcified and tortuous aorta. Degenerative changes throughout the spine. IMPRESSION: Bronchitic changes in the lungs may represent acute or chronic bronchitis. Focal opacity in the right mid lung could represent focal pneumonia, mucous plugging, or parenchymal nodule. Followup PA and lateral chest X-ray is recommended in 3-4 weeks following trial of appropriate clinical therapy to ensure resolution and exclude underlying malignancy. Bronchiectasis. Aortic atherosclerosis. Electronically Signed   By: Lucienne Capers M.D.   On: 11/05/2017 20:46   Ct Head Wo Contrast  Result Date: 11/05/2017 CLINICAL DATA:  Acute confusion.  Headache. EXAM: CT HEAD WITHOUT CONTRAST  TECHNIQUE: Contiguous axial images were obtained from the base of the skull through the vertex without intravenous contrast. 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 No acute hemorrhage or evidence of acute ischemia. Age related atrophy and chronic small vessel ischemic change. Remote lacunar infarcts in the right cerebellum. No hydrocephalus, mass effect, or midline shift. No subdural or extra-axial fluid collection. Vascular: No hyperdense vessel. 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 Skullbase atherosclerosis. Skull: No fracture or focal lesion. Sinuses/Orbits: Mucosal thickening with fluid level on the left maxillary sinus. Opacification of scattered left ethmoid air cells. The mastoid air cells are clear. Other: None. IMPRESSION: 1.  No acute intracranial abnormality. 2. Remote infract in the right parietooccipital lobe. Age related atrophy and chronic small vessel ischemia. Electronically Signed   By: Jeb Levering M.D.   On: 11/05/2017 21:09     CBC Recent Labs  Lab 11/05/17 2054 11/06/17 0414  WBC 10.9 8.9  HGB 12.5 12.0  HCT 36.4 35.7  PLT 394 333  MCV 87.4 87.8  MCH 29.9 29.5  MCHC 34.2 33.6  RDW 13.5 13.5    Chemistries  Recent Labs  Lab 11/05/17 2054 11/06/17 0414 11/07/17 0547  NA 136 139 140  K 3.0* 3.0* 3.4*  CL 100* 106 108  CO2 24 24 25   GLUCOSE 136* 110* 119*  BUN 14 11 <  5*  CREATININE 0.89 0.71 0.61  CALCIUM 9.5 9.0 9.1  MG  --   --  1.6*   ------------------------------------------------------------------------------------------------------------------ estimated creatinine clearance is 37.9 mL/min (by C-G formula based on SCr of 0.61 mg/dL). ------------------------------------------------------------------------------------------------------------------ No results for input(s): HGBA1C in the  last 72 hours. ------------------------------------------------------------------------------------------------------------------ No results for input(s): CHOL, HDL, LDLCALC, TRIG, CHOLHDL, LDLDIRECT in the last 72 hours. ------------------------------------------------------------------------------------------------------------------ No results for input(s): TSH, T4TOTAL, T3FREE, THYROIDAB in the last 72 hours.  Invalid input(s): FREET3 ------------------------------------------------------------------------------------------------------------------ No results for input(s): VITAMINB12, FOLATE, FERRITIN, TIBC, IRON, RETICCTPCT in the last 72 hours.  Coagulation profile No results for input(s): INR, PROTIME in the last 168 hours.  No results for input(s): DDIMER in the last 72 hours.  Cardiac Enzymes Recent Labs  Lab 11/05/17 2054 11/06/17 0612  TROPONINI 0.04* 0.03*   ------------------------------------------------------------------------------------------------------------------ Invalid input(s): POCBNP    Assessment & Plan   IMPRESSION AND PLAN: 1.  Shortness of breath cough and congestion possibly due to viral pneumonia patient's pro-calcitonin level is very low, however due to persistent symptoms we will treat her with oral Levaquin, continue her current cough regimen 2.  Mild acute encephalopathy due to illness now improved 3.    Elevated troponin due to demand ischemia no further workup needed at this point 4.    Hypokalemia being replaced 5.  Generalized anxiety continue alprazolam and BuSpar and Lexapro 6.  Essential hypertension continue therapy with metoprolol and ramipril  Discharge tomorrow     Code Status Orders  (From admission, onward)        Start     Ordered   11/06/17 0353  Full code  Continuous     11/06/17 0352    Code Status History    Date Active Date Inactive Code Status Order ID Comments User Context   This patient has a current code status  but no historical code status.    Advance Directive Documentation     Most Recent Value  Type of Advance Directive  Healthcare Power of Attorney, Living will  Pre-existing out of facility DNR order (yellow form or pink MOST form)  No data  "MOST" Form in Place?  No data           Consults none   DVT Prophylaxis  Lovenox   Lab Results  Component Value Date   PLT 333 11/06/2017     Time Spent in minutes 35 minutes  Greater than 50% of time spent in care coordination and counseling patient regarding the condition and plan of care.   Dustin Flock M.D on 11/07/2017 at 2:42 PM  Between 7am to 6pm - Pager - [phone]  After 6pm go to [email or web address] - password [email or web address] [facility]   Office  [phone]

## 2017-11-08 ENCOUNTER — Inpatient Hospital Stay: Payer: Medicare Other

## 2017-11-08 ENCOUNTER — Encounter: Payer: Self-pay | Admitting: Radiology

## 2017-11-08 LAB — GLUCOSE, CAPILLARY: GLUCOSE-CAPILLARY: 99 mg/dL (ref 65–99)

## 2017-11-08 IMAGING — CR DG CHEST 2V
1 series · 2 of 2 positions shown · non-contrast
Comparison: [DATE]

CLINICAL DATA: Pneumonia, history MI, stroke

EXAM:
CHEST  2 VIEW

[Series 1: dg chest 2 view · 0.14mm/px · 2 of 2 slices shown]
[im 1/2]
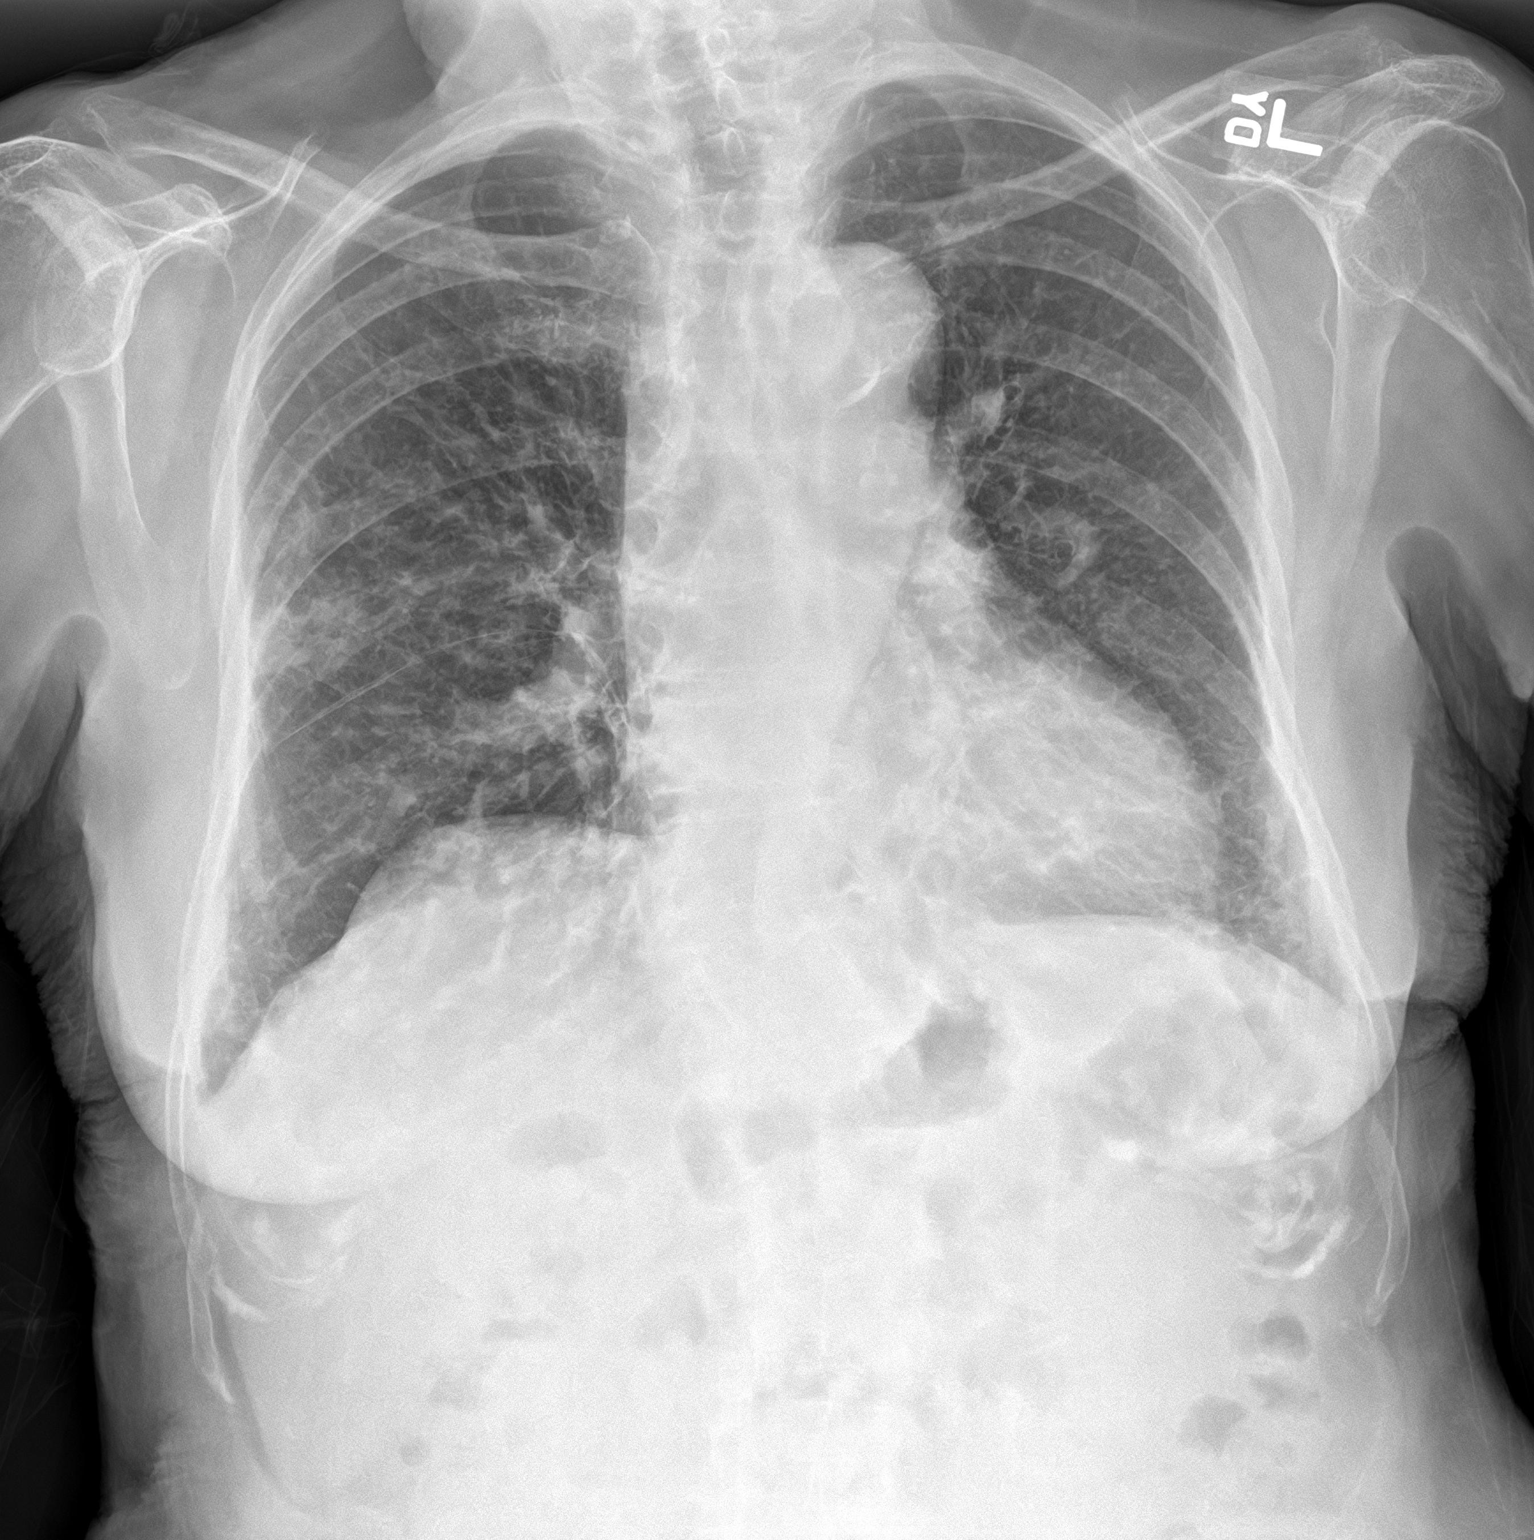
[im 2/2]
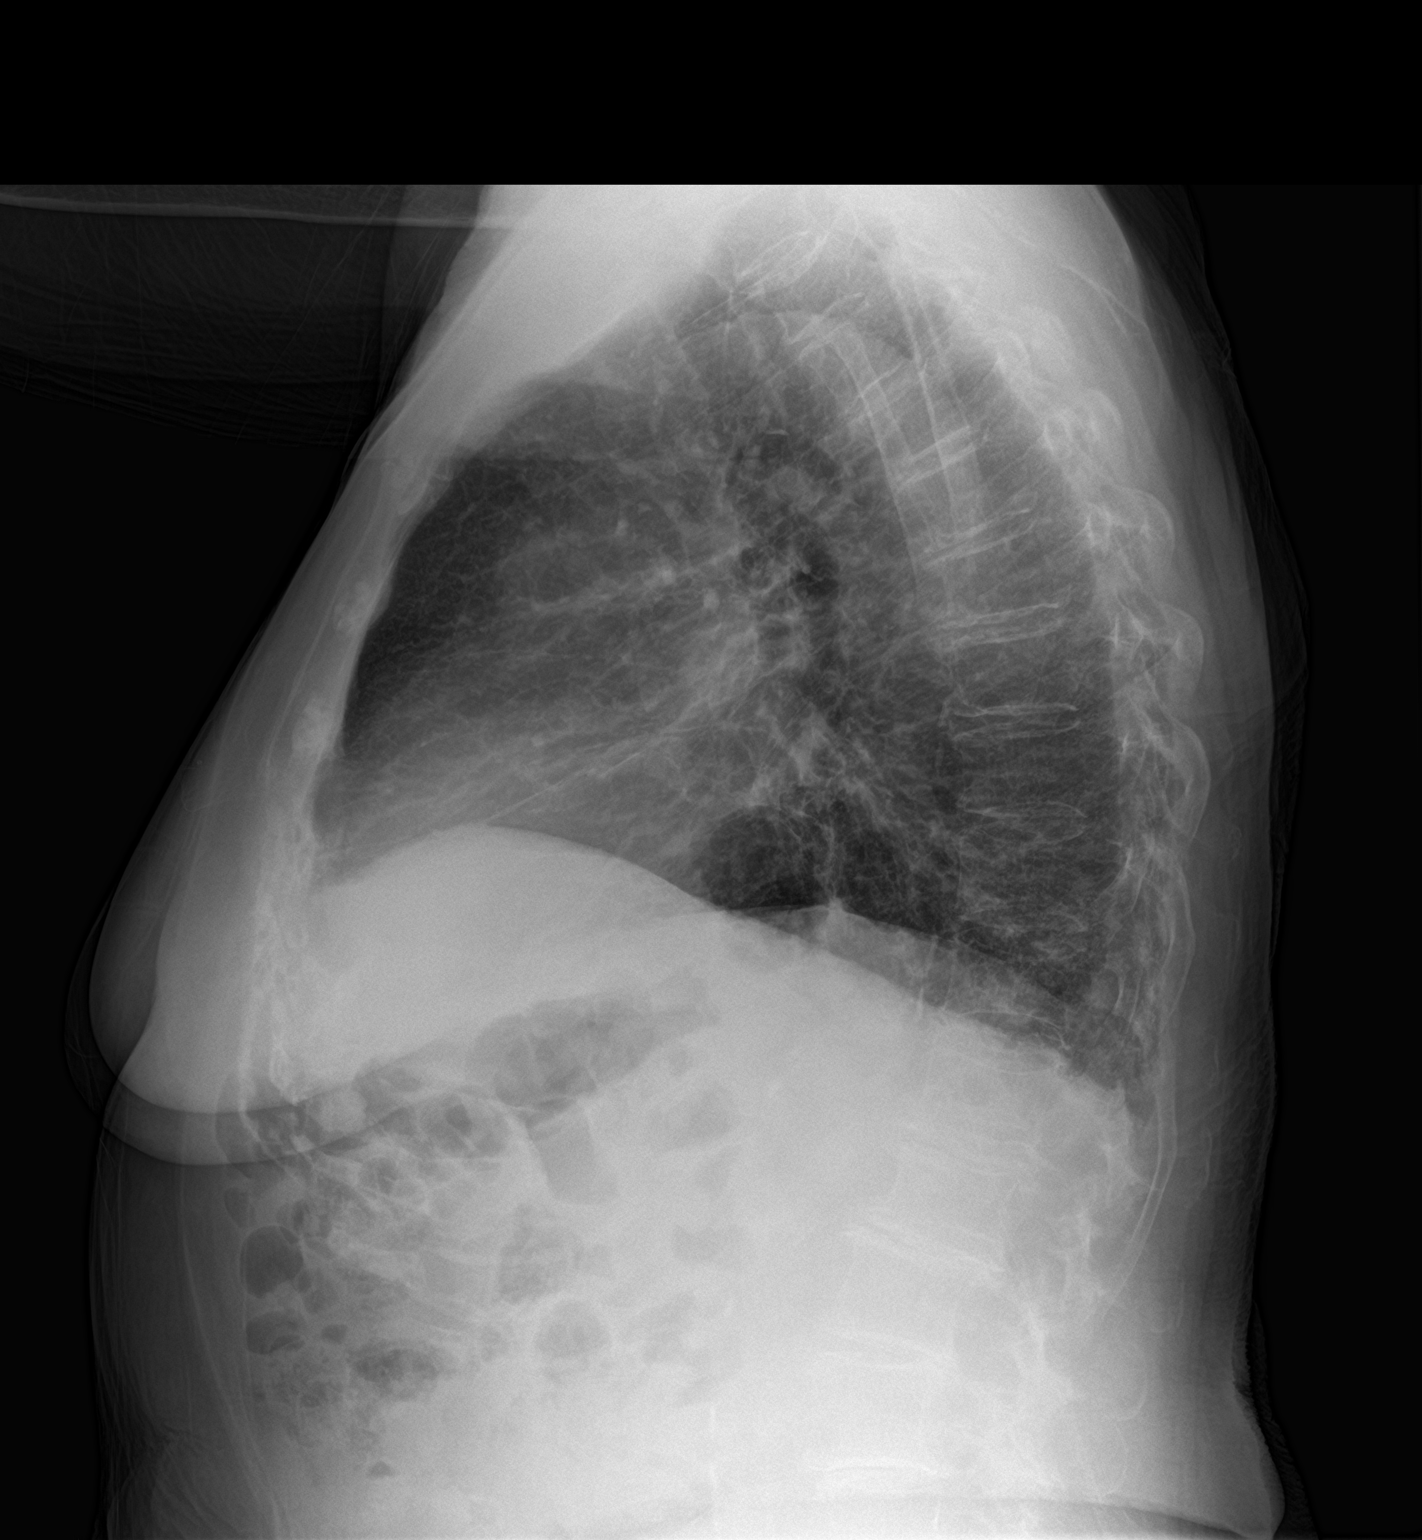

[2 of 2 positions shown; findings below may reference images not displayed]

FINDINGS: Borderline enlargement of cardiac silhouette.

Atherosclerotic calcification aorta.

Mediastinal contours and pulmonary vascularity otherwise normal.

Bronchitic changes with bibasilar infiltrates as well as RIGHT upper
lobe infiltrate just above the minor fissure, little changed.

No new areas of consolidation, pleural effusion or pneumothorax.

Diffuse osseous demineralization.
IMPRESSION: Persistent bronchitic changes and BILATERAL pulmonary infiltrates,
not significantly changed.

## 2017-11-08 IMAGING — CT CT CHEST W/ CM
2 of 3 series · 15 of 36 positions shown, 18 images · IV contrast (iopamidol)
Comparison: None.

CLINICAL DATA: Cough.

EXAM:
CT CHEST WITH CONTRAST
TECHNIQUE: Multidetector CT imaging of the chest was performed during
intravenous contrast administration.
CONTRAST:  75mL [11] IOPAMIDOL ([11]) INJECTION 61%

[Series 2: axial st · axial · 0.65mm/px · z∈[-635,-429]mm · 12 of 121 slices shown, 15 images]
[im 9/121  mediastinal]
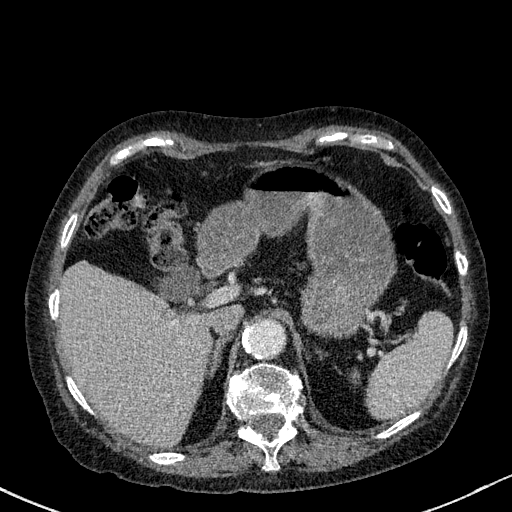
[im 9/121  lung]
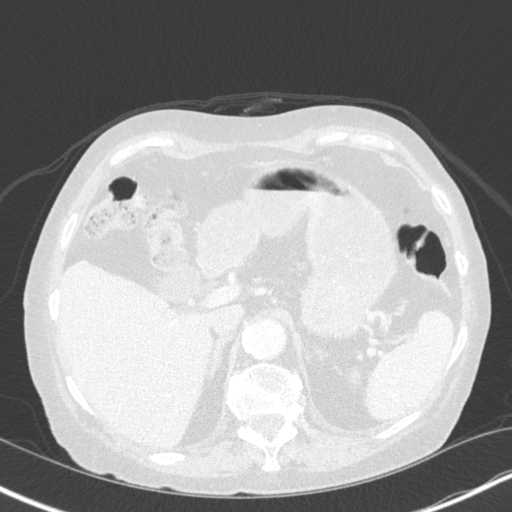
[im 18/121  lung]
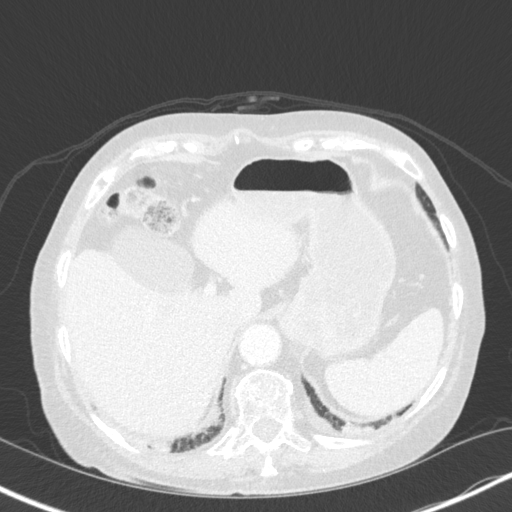
[im 27/121  lung]
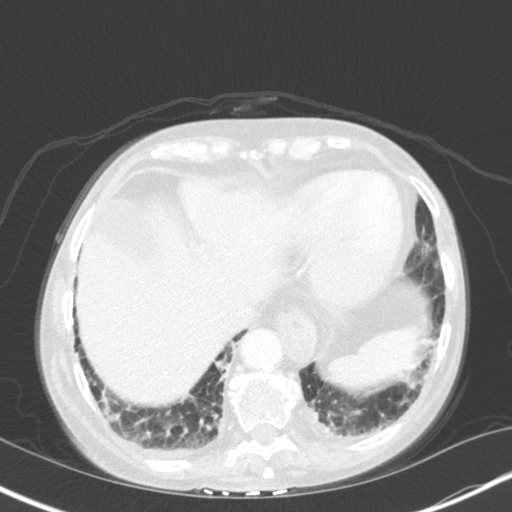
[im 36/121  lung]
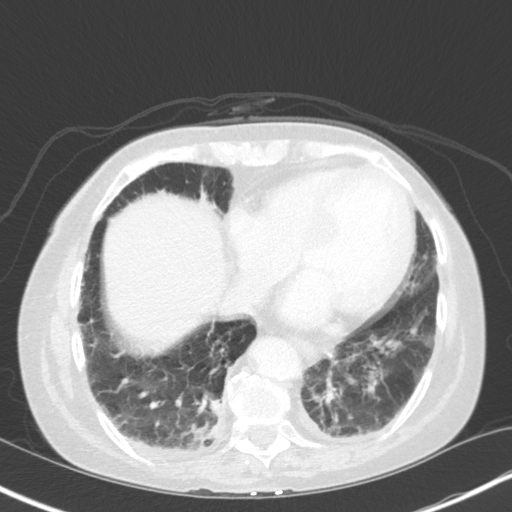
[im 45/121  mediastinal]
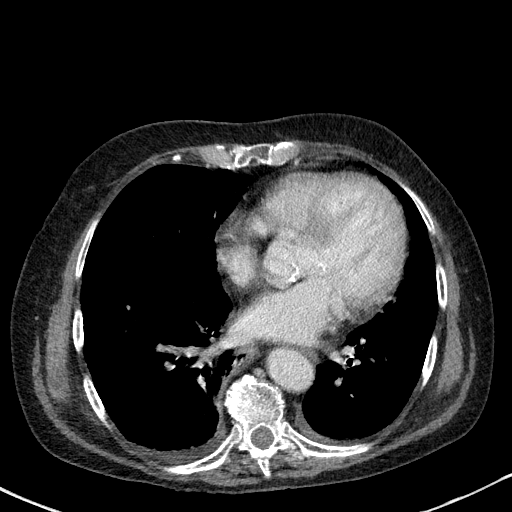
[im 45/121  lung]
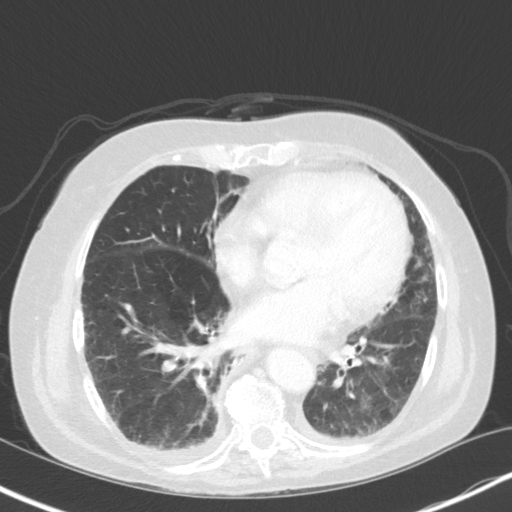
[im 54/121  lung]
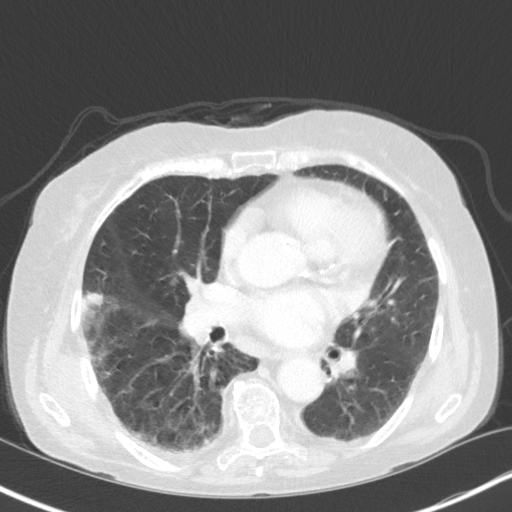
[im 67/121  lung]
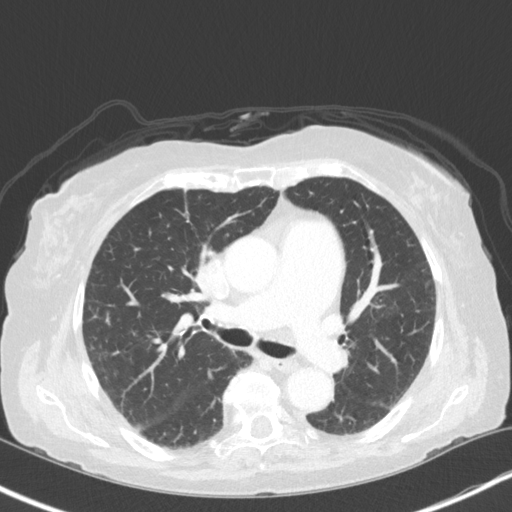
[im 76/121  lung]
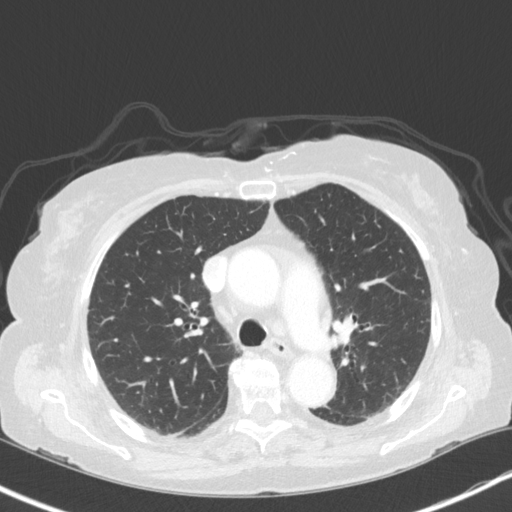
[im 85/121  mediastinal]
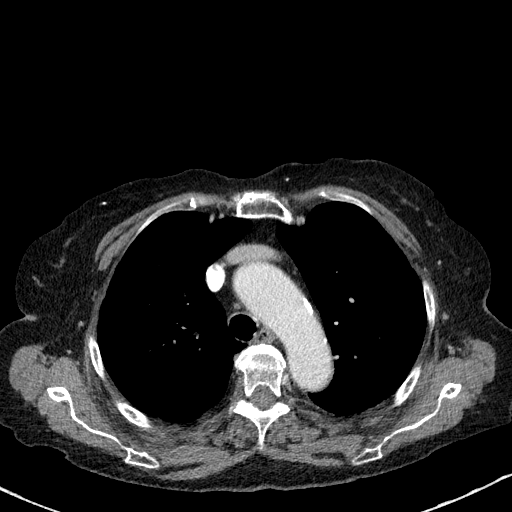
[im 85/121  lung]
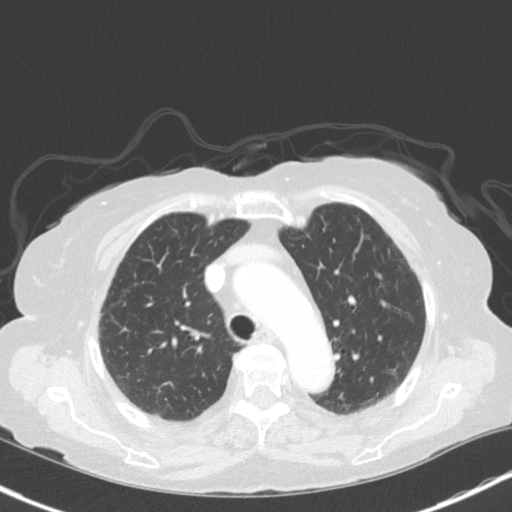
[im 94/121  lung]
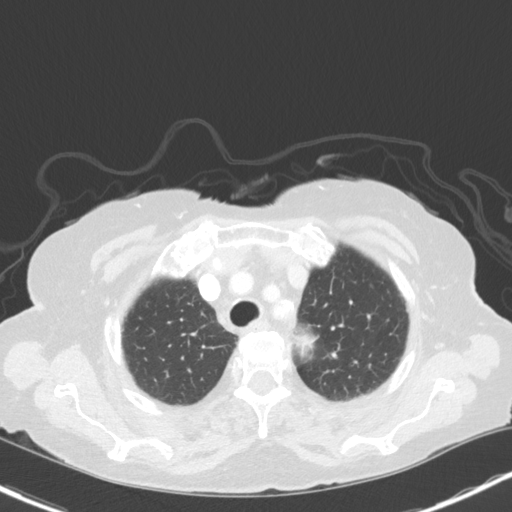
[im 103/121  lung]
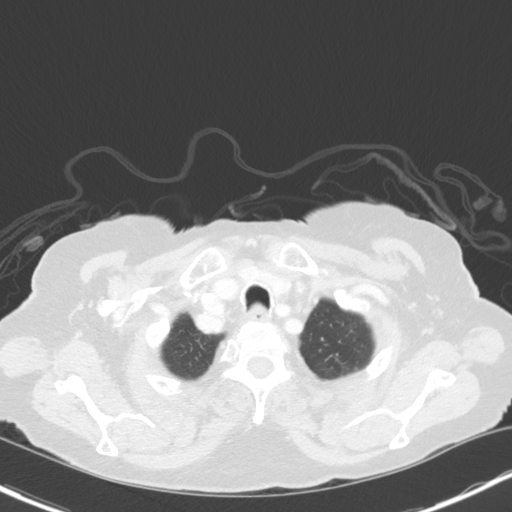
[im 112/121  lung]
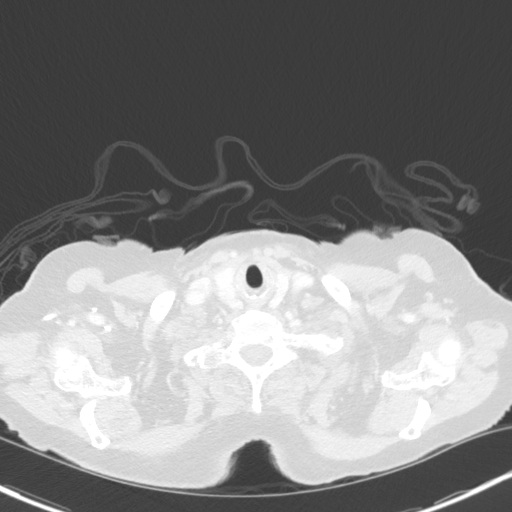

[Series 5: coronal · coronal · 0.47mm/px · 3 of 117 slices shown]
[im 24/117  lung]
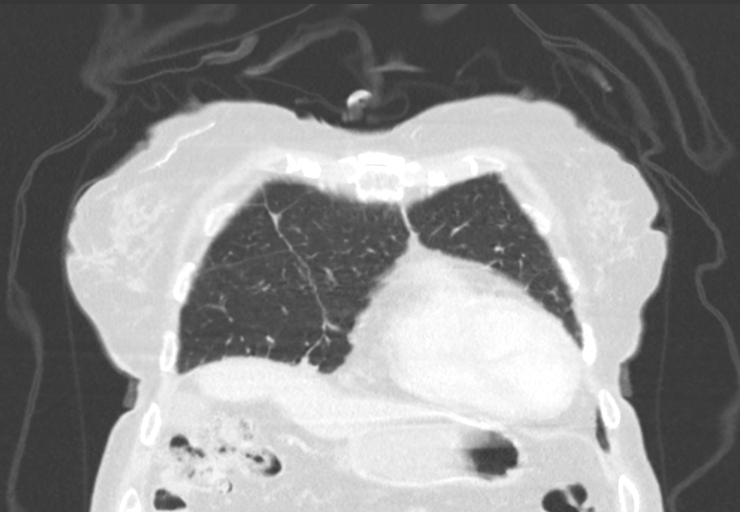
[im 47/117  lung]
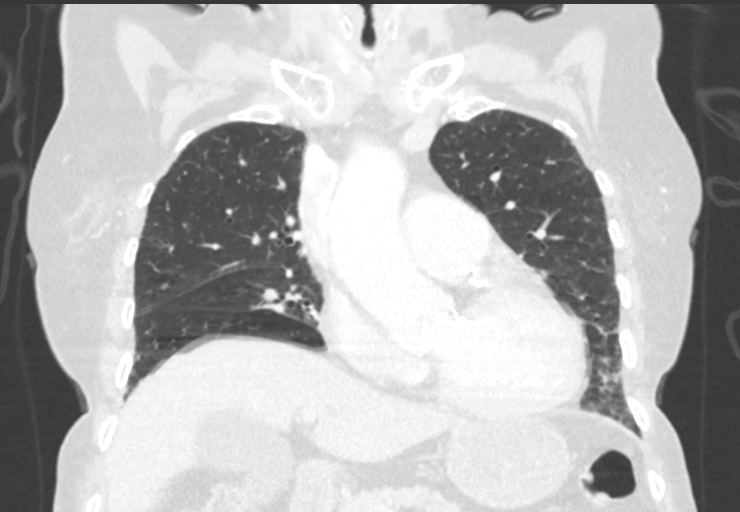
[im 70/117  lung]
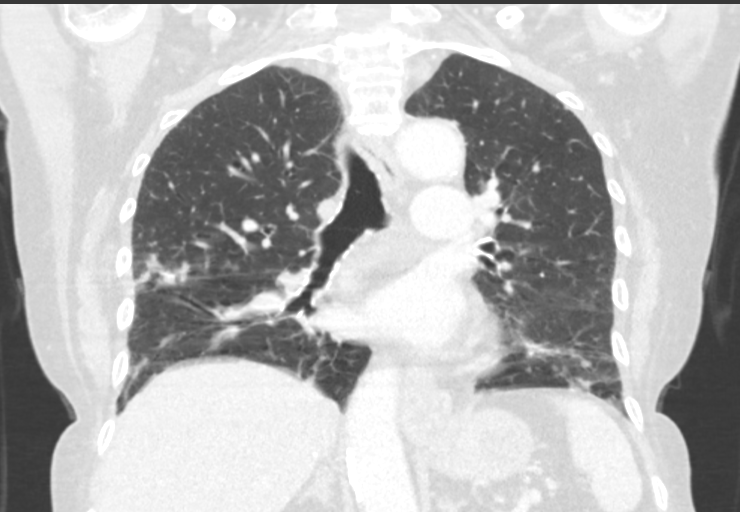

[15 of 36 positions shown; findings below may reference images not displayed]

FINDINGS: Cardiovascular: Heart size upper normal to mildly increased.
Atherosclerotic calcification is noted in the wall of the thoracic
aorta. Coronary artery calcification is evident. Enlargement of the
main pulmonary arteries suggests pulmonary arterial hypertension.

Mediastinum/Nodes: No mediastinal lymphadenopathy. There is no hilar
lymphadenopathy. The esophagus has normal imaging features. Small
hiatal hernia. There is no axillary lymphadenopathy.

Lungs/Pleura: Subtle subpleural reticulation noted bilaterally with
a basilar predominance. Circumferential bronchial wall thickening
evident. There is patchy airspace disease in the posterior right
upper lobe. Inferior lung bases not well evaluated given motion
artifact, but probable component of small airway impaction suspected
with patchy airspace opacity.

Upper Abdomen: Unremarkable.

Musculoskeletal: Bone windows reveal no worrisome lytic or sclerotic
osseous lesions.
IMPRESSION: 1. Patchy airspace opacity posterior right upper lobe compatible
with pneumonia. Follow-up imaging recommended to ensure resolution.
2. Bronchial wall thickening with probable small airway impaction
and patchy airspace opacity in the posterior lung bases. Atypical
infection could have this appearance. Aspiration could present
similarly in the appropriate clinical setting. This could also be
reassessed at the time of followup.
3. Enlargement of pulmonary arteries suggests pulmonary arterial
hypertension.
4. Small hiatal hernia.
5.  Aortic Atherosclerois ([11]-170.0)

## 2017-11-08 MED ORDER — MAGNESIUM OXIDE 400 (241.3 MG) MG PO TABS
400.0000 mg | ORAL_TABLET | Freq: Two times a day (BID) | ORAL | Status: DC
  Administered 2017-11-08 – 2017-11-12 (×9): 400 mg via ORAL
  Filled 2017-11-08 (×9): qty 1

## 2017-11-08 MED ORDER — IPRATROPIUM-ALBUTEROL 0.5-2.5 (3) MG/3ML IN SOLN
3.0000 mL | RESPIRATORY_TRACT | Status: DC | PRN
  Filled 2017-11-08: qty 3

## 2017-11-08 MED ORDER — IPRATROPIUM-ALBUTEROL 0.5-2.5 (3) MG/3ML IN SOLN
3.0000 mL | Freq: Three times a day (TID) | RESPIRATORY_TRACT | Status: DC
  Administered 2017-11-08 – 2017-11-09 (×4): 3 mL via RESPIRATORY_TRACT
  Filled 2017-11-08 (×4): qty 3

## 2017-11-08 MED ORDER — IOPAMIDOL (ISOVUE-300) INJECTION 61%
75.0000 mL | Freq: Once | INTRAVENOUS | Status: AC | PRN
  Administered 2017-11-08: 15:00:00 75 mL via INTRAVENOUS

## 2017-11-08 NOTE — Clinical Social Work Note (Signed)
CSW received verbal consult from [facility] that this patient may need STR or Home Health needs. CSW has requested that PT be ordered to determine the level of care needs. CSW is watching for PT recommendation for STR. Should PT recommend home health, CSW will sign off.  Santiago Bumpers, MSW, [place] [phone]

## 2017-11-08 NOTE — Care Management Note (Signed)
Case Management Note  Patient Details  Name: Michele [name] MRN: [id number] Date of Birth: 1923/11/06  Subjective/Objective:     Michele [name] is now back in her room and is alert and oriented x 3. Michele [name] reports that she resides at home with her son, Kenniyah Sasaki, who is with her every night. She also reports that her granddaughter, Elenor Quinones, visits her often and can also provide transportation to appointments. Michele [name] reports that her daughter in [place], [name], is her POA and "she signs all my checks."  Michele [name] has 2 canes and a rolling walker at home. Michele [name]'s PCP is Dr Ramonita Lab. Her pharmacy is [facility] in [place]. Michele [name] has no home oxygen, and no home health services per her report. Her daughter and POA [name] chose [facility] to be Michele [name]'s home health services provider if home health is ordered. Case management will follow for discharge planning. [facility]-PT evaluation is pending.              Action/Plan:   Expected Discharge Date:  11/09/17               Expected Discharge Plan:     In-House Referral:     Discharge planning Services     Post Acute Care Choice:    Choice offered to:     DME Arranged:    DME Agency:     HH Arranged:    HH Agency:     Status of Service:     If discussed at [employer], dates discussed:    Additional Comments:  Taeshawn Helfman A, RN 11/08/2017, 4:20 PM

## 2017-11-08 NOTE — Care Management Note (Addendum)
Case Management Note  Patient Details  Name: KURT AZIMI MRN: [id number] Date of Birth: 1923-11-25  Subjective/Objective:      Went to [facility] room and aides were providing her with bathing. Will attempt to visit her again later today.  Weekend SWer has requested that Dr Dustin Flock please order a PT evaluation.              Action/Plan:   Expected Discharge Date:  11/09/17               Expected Discharge Plan:     In-House Referral:     Discharge planning Services     Post Acute Care Choice:    Choice offered to:     DME Arranged:    DME Agency:     HH Arranged:    HH Agency:     Status of Service:     If discussed at [employer], dates discussed:    Additional Comments:  Carle Dargan A, RN 11/08/2017, 11:51 AM

## 2017-11-08 NOTE — Plan of Care (Signed)
  Clinical Measurements: Diagnostic test results will improve 11/08/2017 1536 - Progressing by Oris Drone, RN  Chest xray and CT Chest performed this shift Activity: Risk for activity intolerance will decrease 11/08/2017 1536 - Progressing by Oris Drone, RN  Pt oob to br x 1 minimal assist this shift Safety: Ability to remain free from injury will improve 11/08/2017 1536 - Progressing by Oris Drone, RN  Pt remains on High Fall risks

## 2017-11-08 NOTE — Progress Notes (Signed)
Patient has been impulsive tonight and has gotten out of bed 8 or more times, activating her bed alarm. Patient has had to be reoriented to her environment each time she has gotten out of bed.

## 2017-11-08 NOTE — Progress Notes (Signed)
[place] [facility]                                                                                                                                                                                  Patient Demographics   Michele [name], is a 84 y.o. female, DOB - 20-May-1924, KDX:[id number]  Admit date - 11/05/2017   Admitting Physician Amelia Jo, MD  Outpatient Primary MD for the patient is Tama High III, MD   LOS - 2  Subjective: Still has cough  Review of Systems:   CONSTITUTIONAL: No documented fever. No fatigue, weakness. No weight gain, no weight loss.  EYES: No blurry or double vision.  ENT: No tinnitus. No postnasal drip. No redness of the oropharynx.  RESPIRATORY: Positive cough, no wheeze, no hemoptysis.  No dyspnea.  CARDIOVASCULAR: No chest pain. No orthopnea. No palpitations. No syncope.  GASTROINTESTINAL: No nausea, no vomiting or diarrhea. No abdominal pain. No melena or hematochezia.  GENITOURINARY: No dysuria or hematuria.  ENDOCRINE: No polyuria or nocturia. No heat or cold intolerance.  HEMATOLOGY: No anemia. No bruising. No bleeding.  INTEGUMENTARY: No rashes. No lesions.  MUSCULOSKELETAL: No arthritis. No swelling. No gout.  NEUROLOGIC: No numbness, tingling, or ataxia. No seizure-type activity.  PSYCHIATRIC: No anxiety. No insomnia. No ADD.    Vitals:   Vitals:   11/07/17 2305 11/08/17 0452 11/08/17 0610 11/08/17 1327  BP: (!) 152/66  (!) 162/79 (!) 161/71  Pulse: 72  71 74  Resp:   18   Temp:   98.2 F (36.8 C) 98.3 F (36.8 C)  TempSrc:   Oral Oral  SpO2:   92% 95%  Weight:  130 lb 11.2 oz (59.3 kg)    Height:        Wt Readings from Last 3 Encounters:  11/08/17 130 lb 11.2 oz (59.3 kg)     Intake/Output Summary (Last 24 hours) at 11/08/2017 1426 Last data filed at 11/08/2017 1107 Gross per 24 hour  Intake 410 ml  Output -  Net 410 ml    Physical Exam:   GENERAL: Pleasant-appearing in no apparent  distress.  HEAD, EYES, EARS, NOSE AND THROAT: Atraumatic, normocephalic. Extraocular muscles are intact. Pupils equal and reactive to light. Sclerae anicteric. No conjunctival injection. No oro-pharyngeal erythema.  NECK: Supple. There is no jugular venous distention. No bruits, no lymphadenopathy, no thyromegaly.  HEART: Regular rate and rhythm,. No murmurs, no rubs, no clicks.  LUNGS: Rhonchus breath sounds bilaterally no accessory muscle usage ABDOMEN: Soft, flat, nontender, nondistended. Has good bowel sounds. No hepatosplenomegaly appreciated.  EXTREMITIES: No evidence of any cyanosis, clubbing, or peripheral edema.  +2 pedal  and radial pulses bilaterally.  NEUROLOGIC: The patient is alert, awake, and oriented x3 with no focal motor or sensory deficits appreciated bilaterally.  SKIN: Moist and warm with no rashes appreciated.  Psych: Not anxious, depressed LN: No inguinal LN enlargement    Antibiotics   Anti-infectives (From admission, onward)   Start     Dose/Rate Route Frequency Ordered Stop   11/06/17 1000  cefTRIAXone (ROCEPHIN) 1 g in dextrose 5 % 50 mL IVPB - Premix  Status:  Discontinued     1 g 100 mL/hr over 30 Minutes Intravenous Every 24 hours 11/06/17 0243 11/06/17 0432   11/06/17 1000  azithromycin (ZITHROMAX) 500 mg in dextrose 5 % 250 mL IVPB  Status:  Discontinued     500 mg 250 mL/hr over 60 Minutes Intravenous Every 24 hours 11/06/17 0244 11/06/17 0332   11/06/17 1000  cefTRIAXone (ROCEPHIN) 1 g in dextrose 5 % 50 mL IVPB     1 g 100 mL/hr over 30 Minutes Intravenous Every 24 hours 11/06/17 0432     11/06/17 0345  doxycycline (VIBRAMYCIN) 100 mg in dextrose 5 % 250 mL IVPB     100 mg 125 mL/hr over 120 Minutes Intravenous Every 12 hours 11/06/17 0332     11/06/17 0015  cefTRIAXone (ROCEPHIN) 1 g in dextrose 5 % 50 mL IVPB  Status:  Discontinued     1 g 100 mL/hr over 30 Minutes Intravenous Every 24 hours 11/06/17 0007 11/06/17 0028   11/06/17 0015  azithromycin  (ZITHROMAX) 500 mg in dextrose 5 % 250 mL IVPB  Status:  Discontinued     500 mg 250 mL/hr over 60 Minutes Intravenous Every 24 hours 11/06/17 0007 11/06/17 0028   11/05/17 2245  cefTRIAXone (ROCEPHIN) 1 g in dextrose 5 % 50 mL IVPB - Premix     1 g 100 mL/hr over 30 Minutes Intravenous  Once 11/05/17 2241 11/05/17 2352   11/05/17 2245  azithromycin (ZITHROMAX) 500 mg in dextrose 5 % 250 mL IVPB     500 mg 250 mL/hr over 60 Minutes Intravenous  Once 11/05/17 2241 11/06/17 0031      Medications   Scheduled Meds: . benzonatate  200 mg Oral TID  . busPIRone  5 mg Oral BID  . diltiazem  120 mg Oral Daily  . docusate sodium  100 mg Oral BID  . escitalopram  5 mg Oral Daily  . guaiFENesin  600 mg Oral BID  . heparin  5,000 Units Subcutaneous Q8H  . ipratropium-albuterol  3 mL Nebulization TID  . magnesium oxide  400 mg Oral BID  . metoprolol succinate  25 mg Oral Daily  . ramipril  10 mg Oral BID   Continuous Infusions: . cefTRIAXone (ROCEPHIN) IVPB 1 gram/50 mL D5W Stopped (11/08/17 1108)  . doxycycline (VIBRAMYCIN) IV Stopped (11/08/17 0647)   PRN Meds:.acetaminophen **OR** acetaminophen, ALPRAZolam, bisacodyl, guaiFENesin-codeine, HYDROcodone-acetaminophen, ipratropium-albuterol, ondansetron **OR** ondansetron (ZOFRAN) IV, traZODone   Data Review:   Micro Results Recent Results (from the past 240 hour(s))  Blood Culture (routine x 2)     Status: None (Preliminary result)   Collection Time: 11/05/17 11:05 PM  Result Value Ref Range Status   Specimen Description BLOOD LEFT ANTECUBITAL  Final   Special Requests   Final    BOTTLES DRAWN AEROBIC AND ANAEROBIC Blood Culture adequate volume   Culture   Final    NO GROWTH 3 DAYS Performed at [facility], [address] [place], [place] [address]  Report Status PENDING  Incomplete  Blood Culture (routine x 2)     Status: None (Preliminary result)   Collection Time: 11/05/17 11:05 PM  Result Value Ref Range Status    Specimen Description BLOOD BLOOD RIGHT FOREARM  Final   Special Requests   Final    BOTTLES DRAWN AEROBIC AND ANAEROBIC Blood Culture adequate volume   Culture   Final    NO GROWTH 3 DAYS Performed at [facility], [address], [place], [place] [address]    Report Status PENDING  Incomplete    Radiology Reports Dg Chest 2 View  Result Date: 11/08/2017 CLINICAL DATA:  Pneumonia, history MI, stroke EXAM: CHEST  2 VIEW COMPARISON:  11/05/2017 FINDINGS: Borderline enlargement of cardiac silhouette. Atherosclerotic calcification aorta. Mediastinal contours and pulmonary vascularity otherwise normal. Bronchitic changes with bibasilar infiltrates as well as RIGHT upper lobe infiltrate just above the minor fissure, little changed. No new areas of consolidation, pleural effusion or pneumothorax. Diffuse osseous demineralization. IMPRESSION: Persistent bronchitic changes and BILATERAL pulmonary infiltrates, not significantly changed. Electronically Signed   By: Lavonia Dana M.D.   On: 11/08/2017 14:17   Dg Chest 2 View  Result Date: 11/05/2017 CLINICAL DATA:  Patient seen Dr Caryl Comes 2 days ago and went from zpak to doxycycline for bilateral pneumonia; having productive cough yellow sputum x 2 weeks, at times brown with sinus congestion AND pressure; patient reports discomfort to chest from coughing as well as headache ; history of aneurysm EXAM: CHEST  2 VIEW COMPARISON:  11/27/2016 FINDINGS: Cardiac enlargement. No vascular congestion or edema. There is diffuse interstitial pattern to the lungs with peribronchial thickening likely representing diffuse bronchitis. This could be either acute or chronic. There is a focal irregular opacity over the right mid lung. This could represent focal pneumonia, mucous plugging, or a parenchymal nodule. This is new since previous study. Recommend follow-up after treatment of acute process. Calcified granulomas in the left lung. Calcified and tortuous aorta.  Degenerative changes throughout the spine. IMPRESSION: Bronchitic changes in the lungs may represent acute or chronic bronchitis. Focal opacity in the right mid lung could represent focal pneumonia, mucous plugging, or parenchymal nodule. Followup PA and lateral chest X-ray is recommended in 3-4 weeks following trial of appropriate clinical therapy to ensure resolution and exclude underlying malignancy. Bronchiectasis. Aortic atherosclerosis. Electronically Signed   By: Lucienne Capers M.D.   On: 11/05/2017 20:46   Ct Head Wo Contrast  Result Date: 11/05/2017 CLINICAL DATA:  Acute confusion.  Headache. EXAM: CT HEAD WITHOUT CONTRAST TECHNIQUE: Contiguous axial images were obtained from the base of the skull through the vertex without intravenous contrast. 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 No acute hemorrhage or evidence of acute ischemia. Age related atrophy and chronic small vessel ischemic change. Remote lacunar infarcts in the right cerebellum. No hydrocephalus, mass effect, or midline shift. No subdural or extra-axial fluid collection. Vascular: No hyperdense vessel. 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 Skullbase atherosclerosis. Skull: No fracture or focal lesion. Sinuses/Orbits: Mucosal thickening with fluid level on the left maxillary sinus. Opacification of scattered left ethmoid air cells. The mastoid air cells are clear. Other: None. IMPRESSION: 1.  No acute intracranial abnormality. 2. Remote infract in the right parietooccipital lobe. Age related atrophy and chronic small vessel ischemia. Electronically Signed   By: Jeb Levering M.D.   On: 11/05/2017  21:09     CBC Recent Labs  Lab 11/05/17 2054 11/06/17 0414  WBC 10.9 8.9  HGB 12.5 12.0  HCT 36.4 35.7  PLT 394 333  MCV 87.4 87.8  MCH 29.9 29.5  MCHC 34.2 33.6   RDW 13.5 13.5    Chemistries  Recent Labs  Lab 11/05/17 2054 11/06/17 0414 11/07/17 0547  NA 136 139 140  K 3.0* 3.0* 3.4*  CL 100* 106 108  CO2 24 24 25   GLUCOSE 136* 110* 119*  BUN 14 11 <5*  CREATININE 0.89 0.71 0.61  CALCIUM 9.5 9.0 9.1  MG  --   --  1.6*   ------------------------------------------------------------------------------------------------------------------ estimated creatinine clearance is 37.9 mL/min (by C-G formula based on SCr of 0.61 mg/dL). ------------------------------------------------------------------------------------------------------------------ No results for input(s): HGBA1C in the last 72 hours. ------------------------------------------------------------------------------------------------------------------ No results for input(s): CHOL, HDL, LDLCALC, TRIG, CHOLHDL, LDLDIRECT in the last 72 hours. ------------------------------------------------------------------------------------------------------------------ No results for input(s): TSH, T4TOTAL, T3FREE, THYROIDAB in the last 72 hours.  Invalid input(s): FREET3 ------------------------------------------------------------------------------------------------------------------ No results for input(s): VITAMINB12, FOLATE, FERRITIN, TIBC, IRON, RETICCTPCT in the last 72 hours.  Coagulation profile No results for input(s): INR, PROTIME in the last 168 hours.  No results for input(s): DDIMER in the last 72 hours.  Cardiac Enzymes Recent Labs  Lab 11/05/17 2054 11/06/17 0612  TROPONINI 0.04* 0.03*   ------------------------------------------------------------------------------------------------------------------ Invalid input(s): POCBNP    Assessment & Plan   IMPRESSION AND PLAN: 1.  Shortness of breath cough and congestion persistent infiltrates on the chest x-ray will obtain CT scan of the chest continue iv antibiotic  2.  Mild acute encephalopathy due to illness waxing and  waning 3.    Elevated troponin due to demand ischemia no further workup needed at this point 4.    Hypokalemia being replaced 5.  Generalized anxiety continue alprazolam and BuSpar and Lexapro 6.  Essential hypertension continue therapy with metoprolol and ramipril  Discharge tomorrow     Code Status Orders  (From admission, onward)        Start     Ordered   11/06/17 0353  Full code  Continuous     11/06/17 0352    Code Status History    Date Active Date Inactive Code Status Order ID Comments User Context   This patient has a current code status but no historical code status.    Advance Directive Documentation     Most Recent Value  Type of Advance Directive  Healthcare Power of Attorney, Living will  Pre-existing out of facility DNR order (yellow form or pink MOST form)  No data  "MOST" Form in Place?  No data           Consults none   DVT Prophylaxis  Lovenox   Lab Results  Component Value Date   PLT 333 11/06/2017     Time Spent in minutes 32 minutes  Greater than 50% of time spent in care coordination and counseling patient regarding the condition and plan of care.   Dustin Flock M.D on 11/08/2017 at 2:26 PM  Between 7am to 6pm - Pager - [phone]  After 6pm go to [email or web address] - password [email or web address] [place] [place] [facility]   Office  [phone]

## 2017-11-08 NOTE — Care Management Note (Addendum)
Case Management Note  Patient Details  Name: KEYANNA SANDEFER MRN: [id number] Date of Birth: 1924-02-25  Subjective/Objective:     Attempted again to provide a case management assessment for Ms Battle who is now off the unit  getting a CT scan. Spoke with daughter Ms [name] in [place] [phone]) on the phone. [name] requested that case management have a nurse present in Ms Lio's home to stay with her at least a few hours after she returns home, and caretakers to provide care 24/7. This Probation officer explained that, if Ms [facility] hospital physician orders home health, that case management would make a referral to the [facility] of her choice, and that these agencies could not provide daily visits and services were short-term.  A list of Home Health providers was faxed to Ms Rayfield at fax:[phone]. This Probation officer explained the difference between insurance and Medicare approved [facility] services, and agencies who provide personal care services, are not covered by insurance or Medicare, and must be arranged by Ms Cabanilla or her family.    Daughter [name] reports that she is in [place], and that there are no family members or friends in this area that can provide assistance at home for 84yo Ms Pedley who lives alone. After receiving the list of acute home health providers via fax, Ms Rayfield chose [facility]. A heads up referral for home health services was called to Melene Muller at [facility]. Possible discharge tomorrow. PT evaluation/recommendation is pending.    Action/Plan:   Expected Discharge Date:  11/09/17               Expected Discharge Plan:     In-House Referral:     Discharge planning Services     Post Acute Care Choice:    Choice offered to:     DME Arranged:    DME Agency:     HH Arranged:    HH Agency:     Status of Service:     If discussed at [employer], dates discussed:    Additional  Comments:  Satoru Milich A, RN 11/08/2017, 3:15 PM

## 2017-11-09 LAB — BASIC METABOLIC PANEL
ANION GAP: 9 (ref 5–15)
BUN: 8 mg/dL (ref 6–20)
CALCIUM: 9.1 mg/dL (ref 8.9–10.3)
CO2: 25 mmol/L (ref 22–32)
CREATININE: 0.65 mg/dL (ref 0.44–1.00)
Chloride: 108 mmol/L (ref 101–111)
GFR calc Af Amer: >60 mL/min (ref >60)
GLUCOSE: 99 mg/dL (ref 65–99)
Potassium: 3.6 mmol/L (ref 3.5–5.1)
Sodium: 142 mmol/L (ref 135–145)

## 2017-11-09 LAB — CBC
HCT: 35.2 % (ref 35.0–47.0)
HEMOGLOBIN: 11.8 g/dL — AB (ref 12.0–16.0)
MCH: 29.7 pg (ref 26.0–34.0)
MCHC: 33.6 g/dL (ref 32.0–36.0)
MCV: 88.4 fL (ref 80.0–100.0)
PLATELETS: 384 10*3/uL (ref 150–440)
RBC: 3.98 MIL/uL (ref 3.80–5.20)
RDW: 13.6 % (ref 11.5–14.5)
WBC: 7.6 10*3/uL (ref 3.6–11.0)

## 2017-11-09 LAB — GLUCOSE, CAPILLARY: GLUCOSE-CAPILLARY: 91 mg/dL (ref 65–99)

## 2017-11-09 MED ORDER — AZITHROMYCIN 500 MG IV SOLR
500.0000 mg | INTRAVENOUS | Status: AC
  Administered 2017-11-09 – 2017-11-10 (×2): 500 mg via INTRAVENOUS
  Filled 2017-11-09 (×2): qty 500

## 2017-11-09 MED ORDER — IPRATROPIUM-ALBUTEROL 0.5-2.5 (3) MG/3ML IN SOLN
3.0000 mL | Freq: Two times a day (BID) | RESPIRATORY_TRACT | Status: DC
  Administered 2017-11-10 – 2017-11-12 (×5): 3 mL via RESPIRATORY_TRACT
  Filled 2017-11-09 (×5): qty 3

## 2017-11-09 NOTE — Care Management Note (Addendum)
Case Management Note  Patient Details  Name: Michele [name] MRN: [id number] Date of Birth: [birth year]  Subjective/Objective:  Today  Michele [name] is currently pending [name] Pulmonology Consult per shortness of breath, and [name] SLP Consult for [name] swallowing evaluation. Pending [name] PT evaluation.                Action/Plan:   Expected Discharge Date:  11/09/17               Expected Discharge Plan:     In-House Referral:     Discharge planning Services     Post Acute Care Choice:    Choice offered to:     DME Arranged:    DME Agency:     HH Arranged:    HH Agency:     Status of Service:     If discussed at [employer], dates discussed:    Additional Comments:  Michele Yagi A, RN 11/09/2017, 12:24 PM

## 2017-11-09 NOTE — Clinical Social Work Note (Signed)
Clinical Social Work Assessment  Patient Details  Name: Michele [name] MRN: [id number] Date of Birth: 1924/02/12  Date of referral:  11/09/17               Reason for consult:  Facility Placement                Permission sought to share information with:  Facility Art therapist granted to share information::  Yes, Verbal Permission Granted  Name::        Agency::     Relationship::     Contact Information:     Housing/Transportation Living arrangements for the past 2 months:  [place] of Information:  Medical Team, Power of [place], Adult Children Patient Interpreter Needed:  None Criminal Activity/Legal Involvement Pertinent to Current Situation/Hospitalization:  No - Comment as needed Significant Relationships:  Adult Children, Warehouse manager, [employer] Lives with:  Self Do you feel safe going back to the place where you live?  Yes Need for family participation in patient care:  No (Coment)  Care giving concerns:  The patient's HCPOA/daughter has requested higher level of care and is willing to private pay   Social Worker assessment / plan:  The CSW contacted [name], the patient's HCPOA/daughter at her request to discuss discharge planning. Ms. Herschel Senegal disagrees with the discharge plan of home with home health due to safety concerns as her mother lives alone and her mother's only local support is Dominica Severin, the patient's son/HCPOA's brother. The CSW explained the referral process and possible cost for care, and Ms. Rayfield indicated that she is willing to pay out of pocket for all care if need be, and her preference is for [employer] CSW has made referral, and the PASRR is pending QMHP assessment due to the patient's anxiety/medications.   The CSW will continue to follow throughout the discharge process.  Employment status:  Retired Nurse, adult PT Recommendations:  Home with [place]  / Referral to community resources:  [place]  Patient/Family's Response to care:  The patient's POA thanked the CSW for information and care.  Patient/Family's Understanding of and Emotional Response to Diagnosis, Current Treatment, and Prognosis:  The patient's family are quite involved with care, if not directly, then financially. The patient's family is caring and concerned for the patient's safety above all.  Emotional Assessment Appearance:  Appears stated age Attitude/Demeanor/Rapport:  Engaged Affect (typically observed):  Accepting, Appropriate, Pleasant Orientation:  Oriented to Self, Oriented to Place, Oriented to Situation Alcohol / Substance use:  Never Used Psych involvement (Current and /or in the community):  No (Comment)  Discharge Needs  Concerns to be addressed:  Care Coordination, Discharge Planning Concerns Readmission within the last 30 days:  No Current discharge risk:  Lives alone Barriers to Discharge:  Continued Medical Work up   [employer], LCSW 11/09/2017, 2:33 PM

## 2017-11-09 NOTE — Clinical Social Work Note (Signed)
CSW received consult for POA request for SNF placement to be paid out of pocket. CSW will contact the POA when able.  Santiago Bumpers, MSW, Latanya Presser [phone]

## 2017-11-09 NOTE — NC FL2 (Signed)
[place] LEVEL OF CARE SCREENING TOOL     IDENTIFICATION  Patient Name: Michele [name] Birthdate: 1924-05-10 Sex: female Admission Date (Current Location): 11/05/2017  [place] and [place] Number:  Engineering geologist and Address:  [facility], [address], [place], [place] [address]      Provider Number: [id number]  Attending Physician Name and Address:  Dustin Flock, MD  Relative Name and Phone Number:  [name] (Daughter/HCPOA) [phone]    Current Level of Care: Hospital Recommended Level of Care: [place] Prior Approval Number:    Date Approved/Denied:   PASRR Number: Pending  Discharge Plan: SNF    Current Diagnoses: Patient Active Problem List   Diagnosis Date Noted  . Pneumonia 11/06/2017    Orientation RESPIRATION BLADDER Height & Weight     Self, Time, Situation, Place  Normal Continent Weight: 130 lb 12.8 oz (59.3 kg) Height:  5\' 3"  (160 cm)  BEHAVIORAL SYMPTOMS/MOOD NEUROLOGICAL BOWEL NUTRITION STATUS      Continent Diet(Heart healthy)  AMBULATORY STATUS COMMUNICATION OF NEEDS Skin   Limited Assist Verbally Normal                       Personal Care Assistance Level of Assistance  Bathing, Feeding, Dressing Bathing Assistance: Independent Feeding assistance: Independent Dressing Assistance: Independent     Functional Limitations Info             SPECIAL CARE FACTORS FREQUENCY  PT (By licensed PT)     PT Frequency: 5/week              Contractures Contractures Info: Not present    Additional Factors Info  Code Status, Allergies, Psychotropic Code Status Info: Full Allergies Info: Penicillins Psychotropic Info: Xanax, Buspar, Lexapro         Current Medications (11/09/2017):  This is the current hospital active medication list Current Facility-Administered Medications  Medication Dose Route Frequency Provider Last Rate Last Dose  . acetaminophen  (TYLENOL) tablet 650 mg  650 mg Oral Q6H PRN Amelia Jo, MD       Or  . acetaminophen (TYLENOL) suppository 650 mg  650 mg Rectal Q6H PRN Amelia Jo, MD      . ALPRAZolam Duanne Moron) tablet 0.25 mg  0.25 mg Oral QHS PRN Amelia Jo, MD   0.25 mg at 11/08/17 0317  . azithromycin (ZITHROMAX) 500 mg in dextrose 5 % 250 mL IVPB  500 mg Intravenous Q24H Dustin Flock, MD   Stopped at 11/09/17 1214  . benzonatate (TESSALON) capsule 200 mg  200 mg Oral TID Dustin Flock, MD   200 mg at 11/09/17 1025  . bisacodyl (DULCOLAX) EC tablet 5 mg  5 mg Oral Daily PRN Amelia Jo, MD   5 mg at 11/07/17 0931  . busPIRone (BUSPAR) tablet 5 mg  5 mg Oral BID Amelia Jo, MD   5 mg at 11/09/17 1026  . cefTRIAXone (ROCEPHIN) 1 g in dextrose 5 % 50 mL IVPB  1 g Intravenous Q24H Amelia Jo, MD   Stopped at 11/09/17 1105  . diltiazem (DILACOR XR) 24 hr capsule 120 mg  120 mg Oral Daily Amelia Jo, MD   120 mg at 11/09/17 1026  . docusate sodium (COLACE) capsule 100 mg  100 mg Oral BID Amelia Jo, MD   100 mg at 11/09/17 1025  . escitalopram (LEXAPRO) tablet 5 mg  5 mg Oral Daily Amelia Jo, MD   5 mg at  11/09/17 1026  . guaiFENesin (MUCINEX) 12 hr tablet 600 mg  600 mg Oral BID Dustin Flock, MD   600 mg at 11/09/17 1025  . guaiFENesin-codeine 100-10 MG/5ML solution 10 mL  10 mL Oral Q6H PRN Amelia Jo, MD   10 mL at 11/07/17 1337  . heparin injection 5,000 Units  5,000 Units Subcutaneous Q8H Amelia Jo, MD   5,000 Units at 11/09/17 1115  . HYDROcodone-acetaminophen (NORCO/VICODIN) 5-325 MG per tablet 1-2 tablet  1-2 tablet Oral Q4H PRN Amelia Jo, MD   1 tablet at 11/07/17 1337  . ipratropium-albuterol (DUONEB) 0.5-2.5 (3) MG/3ML nebulizer solution 3 mL  3 mL Nebulization Q4H PRN Dustin Flock, MD      . Derrill Memo ON 11/10/2017] ipratropium-albuterol (DUONEB) 0.5-2.5 (3) MG/3ML nebulizer solution 3 mL  3 mL Nebulization BID Dustin Flock, MD      . magnesium oxide (MAG-OX) tablet 400 mg   400 mg Oral BID Dustin Flock, MD   400 mg at 11/09/17 1025  . metoprolol succinate (TOPROL-XL) 24 hr tablet 25 mg  25 mg Oral Daily Amelia Jo, MD   25 mg at 11/09/17 1025  . ondansetron (ZOFRAN) tablet 4 mg  4 mg Oral Q6H PRN Amelia Jo, MD       Or  . ondansetron [facility]) injection 4 mg  4 mg Intravenous Q6H PRN Amelia Jo, MD      . ramipril (ALTACE) capsule 10 mg  10 mg Oral BID Amelia Jo, MD   10 mg at 11/09/17 1026  . traZODone (DESYREL) tablet 25 mg  25 mg Oral QHS PRN Amelia Jo, MD   25 mg at 11/08/17 2030     Discharge Medications: Please see discharge summary for a list of discharge medications.  Relevant Imaging Results:  Relevant Lab Results:   Additional Information SS# [id number]  Zettie Pho, LCSW

## 2017-11-09 NOTE — Evaluation (Signed)
Physical Therapy Evaluation Patient Details Name: Michele [name] MRN: [id number] DOB: August 19, 1924 Today's Date: 11/09/2017   History of Present Illness  84 yo female with onset of SOB and cough was admitted with PNA, pulm arterial HTN and acute encephalopathy.  Has elevated troponin and HTN noted, PMHx:  atherosclerosis, HTN,   Clinical Impression  Pt was seen for evaluation of mobility and noted her O2 sats were fluctuating up with effort.  However she is somewhat tired by the effort but not unsteady.  Will have her continue PT at home and will not need further acute therapy to progress before dc likely today.    Follow Up Recommendations Home health PT;Supervision - Intermittent    Equipment Recommendations  Rolling walker with 5" wheels    Recommendations for Other Services       Precautions / Restrictions Precautions Precautions: Fall(telemetry) Restrictions Weight Bearing Restrictions: No      Mobility  Bed Mobility Overal bed mobility: Modified Independent                Transfers Overall transfer level: Modified independent Equipment used: 1 person hand held assist                Ambulation/Gait Ambulation/Gait assistance: Min guard Ambulation Distance (Feet): 150 Feet Assistive device: Rolling walker (2 wheeled);1 person hand held assist Gait Pattern/deviations: Step-through pattern;Decreased stride length;Wide base of support Gait velocity: reduced Gait velocity interpretation: Below normal speed for age/gender General Gait Details: good control of walker and no help needed to maneuver it  Stairs            Wheelchair Mobility    Modified Rankin (Stroke Patients Only)       Balance Overall balance assessment: Needs assistance Sitting-balance support: Feet supported Sitting balance-Leahy Scale: Good     Standing balance support: Bilateral upper extremity supported;During functional activity Standing balance-Leahy Scale: Fair                                Pertinent Vitals/Pain Pain Assessment: No/denies pain    Home Living Family/patient expects to be discharged to:: Private residence Living Arrangements: Alone Available Help at Discharge: Family;Available PRN/intermittently Type of Home: House       Home Layout: One level Home Equipment: Cane - single point;Walker - 2 wheels Additional Comments: has equipment in good condition per pt    Prior Function Level of Independence: Independent with assistive device(s)               Hand Dominance   Dominant Hand: Right    Extremity/Trunk Assessment   Upper Extremity Assessment Upper Extremity Assessment: Overall WFL for tasks assessed    Lower Extremity Assessment Lower Extremity Assessment: Overall WFL for tasks assessed    Cervical / Trunk Assessment Cervical / Trunk Assessment: Normal  Communication   Communication: No difficulties  Cognition Arousal/Alertness: Awake/alert Behavior During Therapy: WFL for tasks assessed/performed Overall Cognitive Status: Within Functional Limits for tasks assessed                                        General Comments      Exercises     Assessment/Plan    PT Assessment All further PT needs can be met in the next venue of care  PT Problem List Decreased range of motion;Decreased balance;Decreased activity  tolerance;Decreased mobility;Cardiopulmonary status limiting activity       PT Treatment Interventions Gait training;Functional mobility training;Therapeutic activities;Therapeutic exercise;Balance training;Neuromuscular re-education;Patient/family education    PT Goals (Current goals can be found in the Care Plan section)  Acute Rehab PT Goals Patient Stated Goal: to get home and feel better PT Goal Formulation: With patient Time For Goal Achievement: 11/16/17 Potential to Achieve Goals: Good    Frequency     Barriers to discharge Decreased caregiver  support home alone with drop in help    Co-evaluation               AM-PAC PT "6 Clicks" Daily Activity  Outcome Measure Difficulty turning over in bed (including adjusting bedclothes, sheets and blankets)?: None Difficulty moving from lying on back to sitting on the side of the bed? : None Difficulty sitting down on and standing up from a chair with arms (e.g., wheelchair, bedside commode, etc,.)?: None Help needed moving to and from a bed to chair (including a wheelchair)?: None Help needed walking in hospital room?: A Little Help needed climbing 3-5 steps with a railing? : A Little 6 Click Score: 22    End of Session Equipment Utilized During Treatment: Gait belt Activity Tolerance: Patient tolerated treatment well;Other (comment)(O2 sats were better after walk from 91% to 97%.  ) Patient left: in bed;with call bell/phone within reach;with bed alarm set Nurse Communication: Mobility status PT Visit Diagnosis: Unsteadiness on feet (R26.81);Difficulty in walking, not elsewhere classified (R26.2)    Time: 6378-5885 PT Time Calculation (min) (ACUTE ONLY): 25 min   Charges:   PT Evaluation $PT Eval Moderate Complexity: 1 Mod PT Treatments $Gait Training: 8-22 mins   PT G Codes:   PT G-Codes **NOT FOR INPATIENT CLASS** Functional Assessment Tool Used: AM-PAC 6 Clicks Basic Mobility    Michele [name] 11/09/2017, 1:06 PM   Mee Hives, PT MS Acute Rehab Dept. Number: [place] and [place]

## 2017-11-09 NOTE — Progress Notes (Signed)
[place] at [facility]                                                                                                                                                                                  Patient Demographics   Michele [name], is a 85 y.o. female, DOB - 14-Aug-1924, QQI:[id number]  Admit date - 11/05/2017   Admitting Physician Amelia Jo, MD  Outpatient Primary MD for the patient is Adin Hector, MD   LOS - 3  Subjective: Patient still coughing Week  Review of Systems:   CONSTITUTIONAL: No documented fever.  Positive fatigue positive, weakness. No weight gain, no weight loss.  EYES: No blurry or double vision.  ENT: No tinnitus. No postnasal drip. No redness of the oropharynx.  RESPIRATORY: Positive cough, no wheeze, no hemoptysis.  No dyspnea.  CARDIOVASCULAR: No chest pain. No orthopnea. No palpitations. No syncope.  GASTROINTESTINAL: No nausea, no vomiting or diarrhea. No abdominal pain. No melena or hematochezia.  GENITOURINARY: No dysuria or hematuria.  ENDOCRINE: No polyuria or nocturia. No heat or cold intolerance.  HEMATOLOGY: No anemia. No bruising. No bleeding.  INTEGUMENTARY: No rashes. No lesions.  MUSCULOSKELETAL: No arthritis. No swelling. No gout.  NEUROLOGIC: No numbness, tingling, or ataxia. No seizure-type activity.  PSYCHIATRIC: No anxiety. No insomnia. No ADD.    Vitals:   Vitals:   11/08/17 1949 11/09/17 0500 11/09/17 0525 11/09/17 1244  BP:   (!) 166/78 (!) 147/68  Pulse:   72 75  Resp:   16 20  Temp:   98.4 F (36.9 C) 97.6 F (36.4 C)  TempSrc:   Oral Oral  SpO2: 97%  95% 98%  Weight:  130 lb 12.8 oz (59.3 kg)    Height:        Wt Readings from Last 3 Encounters:  11/09/17 130 lb 12.8 oz (59.3 kg)     Intake/Output Summary (Last 24 hours) at 11/09/2017 1313 Last data filed at 11/09/2017 1105 Gross per 24 hour  Intake 583.86 ml  Output -  Net 583.86 ml    Physical Exam:   GENERAL:  Pleasant-appearing in no apparent distress.  HEAD, EYES, EARS, NOSE AND THROAT: Atraumatic, normocephalic. Extraocular muscles are intact. Pupils equal and reactive to light. Sclerae anicteric. No conjunctival injection. No oro-pharyngeal erythema.  NECK: Supple. There is no jugular venous distention. No bruits, no lymphadenopathy, no thyromegaly.  HEART: Regular rate and rhythm,. No murmurs, no rubs, no clicks.  LUNGS: Rhonchus breath sounds bilaterally no accessory muscle usage ABDOMEN: Soft, flat, nontender, nondistended. Has good bowel sounds. No hepatosplenomegaly appreciated.  EXTREMITIES: No evidence of any cyanosis, clubbing, or peripheral edema.  +  2 pedal and radial pulses bilaterally.  NEUROLOGIC: The patient is alert, awake, and oriented x3 with no focal motor or sensory deficits appreciated bilaterally.  SKIN: Moist and warm with no rashes appreciated.  Psych: Not anxious, depressed LN: No inguinal LN enlargement    Antibiotics   Anti-infectives (From admission, onward)   Start     Dose/Rate Route Frequency Ordered Stop   11/09/17 1100  azithromycin (ZITHROMAX) 500 mg in dextrose 5 % 250 mL IVPB     500 mg 250 mL/hr over 60 Minutes Intravenous Every 24 hours 11/09/17 1035     11/06/17 1000  cefTRIAXone (ROCEPHIN) 1 g in dextrose 5 % 50 mL IVPB - Premix  Status:  Discontinued     1 g 100 mL/hr over 30 Minutes Intravenous Every 24 hours 11/06/17 0243 11/06/17 0432   11/06/17 1000  azithromycin (ZITHROMAX) 500 mg in dextrose 5 % 250 mL IVPB  Status:  Discontinued     500 mg 250 mL/hr over 60 Minutes Intravenous Every 24 hours 11/06/17 0244 11/06/17 0332   11/06/17 1000  cefTRIAXone (ROCEPHIN) 1 g in dextrose 5 % 50 mL IVPB     1 g 100 mL/hr over 30 Minutes Intravenous Every 24 hours 11/06/17 0432     11/06/17 0345  doxycycline (VIBRAMYCIN) 100 mg in dextrose 5 % 250 mL IVPB  Status:  Discontinued     100 mg 125 mL/hr over 120 Minutes Intravenous Every 12 hours 11/06/17 0332  11/09/17 1035   11/06/17 0015  cefTRIAXone (ROCEPHIN) 1 g in dextrose 5 % 50 mL IVPB  Status:  Discontinued     1 g 100 mL/hr over 30 Minutes Intravenous Every 24 hours 11/06/17 0007 11/06/17 0028   11/06/17 0015  azithromycin (ZITHROMAX) 500 mg in dextrose 5 % 250 mL IVPB  Status:  Discontinued     500 mg 250 mL/hr over 60 Minutes Intravenous Every 24 hours 11/06/17 0007 11/06/17 0028   11/05/17 2245  cefTRIAXone (ROCEPHIN) 1 g in dextrose 5 % 50 mL IVPB - Premix     1 g 100 mL/hr over 30 Minutes Intravenous  Once 11/05/17 2241 11/05/17 2352   11/05/17 2245  azithromycin (ZITHROMAX) 500 mg in dextrose 5 % 250 mL IVPB     500 mg 250 mL/hr over 60 Minutes Intravenous  Once 11/05/17 2241 11/06/17 0031      Medications   Scheduled Meds: . benzonatate  200 mg Oral TID  . busPIRone  5 mg Oral BID  . diltiazem  120 mg Oral Daily  . docusate sodium  100 mg Oral BID  . escitalopram  5 mg Oral Daily  . guaiFENesin  600 mg Oral BID  . heparin  5,000 Units Subcutaneous Q8H  . ipratropium-albuterol  3 mL Nebulization TID  . magnesium oxide  400 mg Oral BID  . metoprolol succinate  25 mg Oral Daily  . ramipril  10 mg Oral BID   Continuous Infusions: . azithromycin Stopped (11/09/17 1214)  . cefTRIAXone (ROCEPHIN) IVPB 1 gram/50 mL D5W Stopped (11/09/17 1105)   PRN Meds:.acetaminophen **OR** acetaminophen, ALPRAZolam, bisacodyl, guaiFENesin-codeine, HYDROcodone-acetaminophen, ipratropium-albuterol, ondansetron **OR** ondansetron (ZOFRAN) IV, traZODone   Data Review:   Micro Results Recent Results (from the past 240 hour(s))  Blood Culture (routine x 2)     Status: None (Preliminary result)   Collection Time: 11/05/17 11:05 PM  Result Value Ref Range Status   Specimen Description BLOOD LEFT ANTECUBITAL  Final   Special Requests   Final  BOTTLES DRAWN AEROBIC AND ANAEROBIC Blood Culture adequate volume   Culture   Final    NO GROWTH 4 DAYS Performed at [facility], [address] [place], [place] [address]    Report Status PENDING  Incomplete  Blood Culture (routine x 2)     Status: None (Preliminary result)   Collection Time: 11/05/17 11:05 PM  Result Value Ref Range Status   Specimen Description BLOOD BLOOD RIGHT FOREARM  Final   Special Requests   Final    BOTTLES DRAWN AEROBIC AND ANAEROBIC Blood Culture adequate volume   Culture   Final    NO GROWTH 4 DAYS Performed at [facility], [address], [place], [place] [address]    Report Status PENDING  Incomplete    Radiology Reports Dg Chest 2 View  Result Date: 11/08/2017 CLINICAL DATA:  Pneumonia, history MI, stroke EXAM: CHEST  2 VIEW COMPARISON:  11/05/2017 FINDINGS: Borderline enlargement of cardiac silhouette. Atherosclerotic calcification aorta. Mediastinal contours and pulmonary vascularity otherwise normal. Bronchitic changes with bibasilar infiltrates as well as RIGHT upper lobe infiltrate just above the minor fissure, little changed. No new areas of consolidation, pleural effusion or pneumothorax. Diffuse osseous demineralization. IMPRESSION: Persistent bronchitic changes and BILATERAL pulmonary infiltrates, not significantly changed. Electronically Signed   By: Lavonia Dana M.D.   On: 11/08/2017 14:17   Dg Chest 2 View  Result Date: 11/05/2017 CLINICAL DATA:  Patient seen Dr Caryl Comes 2 days ago and went from zpak to doxycycline for bilateral pneumonia; having productive cough yellow sputum x 2 weeks, at times brown with sinus congestion AND pressure; patient reports discomfort to chest from coughing as well as headache ; history of aneurysm EXAM: CHEST  2 VIEW COMPARISON:  11/27/2016 FINDINGS: Cardiac enlargement. No vascular congestion or edema. There is diffuse interstitial pattern to the lungs with peribronchial thickening likely representing diffuse bronchitis. This could be either acute or chronic. There is a focal irregular opacity over the right mid lung. This could represent  focal pneumonia, mucous plugging, or a parenchymal nodule. This is new since previous study. Recommend follow-up after treatment of acute process. Calcified granulomas in the left lung. Calcified and tortuous aorta. Degenerative changes throughout the spine. IMPRESSION: Bronchitic changes in the lungs may represent acute or chronic bronchitis. Focal opacity in the right mid lung could represent focal pneumonia, mucous plugging, or parenchymal nodule. Followup PA and lateral chest X-ray is recommended in 3-4 weeks following trial of appropriate clinical therapy to ensure resolution and exclude underlying malignancy. Bronchiectasis. Aortic atherosclerosis. Electronically Signed   By: Lucienne Capers M.D.   On: 11/05/2017 20:46   Ct Head Wo Contrast  Result Date: 11/05/2017 CLINICAL DATA:  Acute confusion.  Headache. EXAM: CT HEAD WITHOUT CONTRAST TECHNIQUE: Contiguous axial images were obtained from the base of the skull through the vertex without intravenous contrast. 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 No acute hemorrhage or evidence of acute ischemia. Age related atrophy and chronic small vessel ischemic change. Remote lacunar infarcts in the right cerebellum. No hydrocephalus, mass effect, or midline shift. No subdural or extra-axial fluid collection. Vascular: No hyperdense vessel. 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 Skullbase atherosclerosis. Skull: No fracture or focal lesion. Sinuses/Orbits: Mucosal thickening with fluid level on the left maxillary sinus. Opacification of scattered left ethmoid air cells. The mastoid air cells are clear. Other:  None. IMPRESSION: 1.  No acute intracranial abnormality. 2. Remote infract in the right parietooccipital lobe. Age related atrophy and chronic small vessel ischemia. Electronically Signed   By:  Jeb Levering M.D.   On: 11/05/2017 21:09   Ct Chest W Contrast  Result Date: 11/08/2017 CLINICAL DATA:  Cough. EXAM: CT CHEST WITH CONTRAST TECHNIQUE: Multidetector CT imaging of the chest was performed during intravenous contrast administration. CONTRAST:  51mL ISOVUE-300 IOPAMIDOL (ISOVUE-300) INJECTION 61% COMPARISON:  None. FINDINGS: Cardiovascular: Heart size upper normal to mildly increased. Atherosclerotic calcification is noted in the wall of the thoracic aorta. Coronary artery calcification is evident. Enlargement of the main pulmonary arteries suggests pulmonary arterial hypertension. Mediastinum/Nodes: No mediastinal lymphadenopathy. There is no hilar lymphadenopathy. The esophagus has normal imaging features. Small hiatal hernia. There is no axillary lymphadenopathy. Lungs/Pleura: Subtle subpleural reticulation noted bilaterally with a basilar predominance. Circumferential bronchial wall thickening evident. There is patchy airspace disease in the posterior right upper lobe. Inferior lung bases not well evaluated given motion artifact, but probable component of small airway impaction suspected with patchy airspace opacity. Upper Abdomen: Unremarkable. Musculoskeletal: Bone windows reveal no worrisome lytic or sclerotic osseous lesions. IMPRESSION: 1. Patchy airspace opacity posterior right upper lobe compatible with pneumonia. Follow-up imaging recommended to ensure resolution. 2. Bronchial wall thickening with probable small airway impaction and patchy airspace opacity in the posterior lung bases. Atypical infection could have this appearance. Aspiration could present similarly in the appropriate clinical setting. This could also be reassessed at the time of followup. 3. Enlargement of pulmonary arteries suggests pulmonary arterial hypertension. 4. Small hiatal hernia. 5.  Aortic Atherosclerois (ICD10-170.0) Electronically Signed   By: Misty Stanley M.D.   On: 11/08/2017 19:46     CBC Recent  Labs  Lab 11/05/17 2054 11/06/17 0414 11/09/17 0429  WBC 10.9 8.9 7.6  HGB 12.5 12.0 11.8*  HCT 36.4 35.7 35.2  PLT 394 333 384  MCV 87.4 87.8 88.4  MCH 29.9 29.5 29.7  MCHC 34.2 33.6 33.6  RDW 13.5 13.5 13.6    Chemistries  Recent Labs  Lab 11/05/17 2054 11/06/17 0414 11/07/17 0547 11/09/17 0429  NA 136 139 140 142  K 3.0* 3.0* 3.4* 3.6  CL 100* 106 108 108  CO2 24 24 25 25   GLUCOSE 136* 110* 119* 99  BUN 14 11 <5* 8  CREATININE 0.89 0.71 0.61 0.65  CALCIUM 9.5 9.0 9.1 9.1  MG  --   --  1.6*  --    ------------------------------------------------------------------------------------------------------------------ estimated creatinine clearance is 37.9 mL/min (by C-G formula based on SCr of 0.65 mg/dL). ------------------------------------------------------------------------------------------------------------------ No results for input(s): HGBA1C in the last 72 hours. ------------------------------------------------------------------------------------------------------------------ No results for input(s): CHOL, HDL, LDLCALC, TRIG, CHOLHDL, LDLDIRECT in the last 72 hours. ------------------------------------------------------------------------------------------------------------------ No results for input(s): TSH, T4TOTAL, T3FREE, THYROIDAB in the last 72 hours.  Invalid input(s): FREET3 ------------------------------------------------------------------------------------------------------------------ No results for input(s): VITAMINB12, FOLATE, FERRITIN, TIBC, IRON, RETICCTPCT in the last 72 hours.  Coagulation profile No results for input(s): INR, PROTIME in the last 168 hours.  No results for input(s): DDIMER in the last 72 hours.  Cardiac Enzymes Recent Labs  Lab 11/05/17 2054 11/06/17 0612  TROPONINI 0.04* 0.03*   ------------------------------------------------------------------------------------------------------------------ Invalid input(s):  POCBNP    Assessment & Plan   IMPRESSION AND PLAN: 1.  Shortness of breath cough and congestion CT scans still showing inflammatory inflammation I will have pulmonary see the patient, discontinue doxycycline I will place her on azithromycin IV continue Rocephin 2.  Mild acute  encephalopathy due to illness waxing and waning 3.    Elevated troponin due to demand ischemia no further workup needed at this point 4.    Hypokalemia being replaced 5.  Generalized anxiety continue alprazolam and BuSpar and Lexapro 6.  Essential hypertension continue therapy with metoprolol and ramipril  Generalized weakness patient's daughter interested in patient going to rehab states that it will pay out of pocket if needed     Code Status Orders  (From admission, onward)        Start     Ordered   11/06/17 0353  Full code  Continuous     11/06/17 0352    Code Status History    Date Active Date Inactive Code Status Order ID Comments User Context   This patient has a current code status but no historical code status.    Advance Directive Documentation     Most Recent Value  Type of Advance Directive  Healthcare Power of Attorney, Living will  Pre-existing out of facility DNR order (yellow form or pink MOST form)  No data  "MOST" Form in Place?  No data           Consults none   DVT Prophylaxis  Lovenox   Lab Results  Component Value Date   PLT 384 11/09/2017     Time Spent in minutes 32 minutes  Greater than 50% of time spent in care coordination and counseling patient regarding the condition and plan of care.   Dustin Flock M.D on 11/09/2017 at 1:13 PM  Between 7am to 6pm - Pager - [phone]  After 6pm go to [email or web address] - password [email or web address] [place] [place] [facility]   Office  [phone]

## 2017-11-09 NOTE — Care Management Note (Signed)
Case Management Note  Patient Details  Name: Michele [name] MRN: [id number] Date of Birth: 17-Nov-1923  Subjective/Objective:    This Probation officer called [name]'s daughter [name], who is also [name]'s POA, today with an update as requested by [name]. [name] communicated that she wants [name] to be placed in a Rehab or into a SNF following this hospital discharge. This Probation officer explained that Medicare would only pay for Rehab or SNF if [facility]-PT recommended it after a PT evaluation. [name] responded that even if Medicare would not pay that the family would pay out of pocket for Rehab or SNF. This information was passed along to the weekend Education officer, museum by Armed forces training and education officer. [name] reported that she plans to fly to [facility] on Wednesday 11/12/17 to see [name].                 Action/Plan:   Expected Discharge Date:  11/09/17               Expected Discharge Plan:     In-House Referral:     Discharge planning Services     Post Acute Care Choice:    Choice offered to:     DME Arranged:    DME Agency:     HH Arranged:    HH Agency:     Status of Service:     If discussed at [employer], dates discussed:    Additional Comments:  Lenward Able A, RN 11/09/2017, 1:29 PM

## 2017-11-10 LAB — CULTURE, BLOOD (ROUTINE X 2)
Culture: NO GROWTH
Culture: NO GROWTH
Special Requests: ADEQUATE
Special Requests: ADEQUATE

## 2017-11-10 LAB — GLUCOSE, CAPILLARY: Glucose-Capillary: 93 mg/dL (ref 65–99)

## 2017-11-10 MED ORDER — AZITHROMYCIN 500 MG PO TABS
500.0000 mg | ORAL_TABLET | Freq: Every day | ORAL | Status: DC
  Administered 2017-11-11: 12:00:00 500 mg via ORAL
  Filled 2017-11-10: qty 1

## 2017-11-10 NOTE — Progress Notes (Signed)
Pharmacy Antibiotic Note  Michele [name] is a 84 y.o. female admitted on 11/05/2017 with pneumonia.  Pharmacy has been consulted for ceftriaxone dosing.  This is day #6 of IV ABX  Plan: Continue ceftriaxone 1 g IV daily  Height: 5\' 3"  (160 cm) Weight: 130 lb 9.6 oz (59.2 kg) IBW/kg (Calculated) : 52.4  Temp (24hrs), Avg:98.3 F (36.8 C), Min:97.6 F (36.4 C), Max:98.8 F (37.1 C)  Recent Labs  Lab 11/05/17 2054 11/05/17 2305 11/06/17 0414 11/07/17 0547 11/09/17 0429  WBC 10.9  --  8.9  --  7.6  CREATININE 0.89  --  0.71 0.61 0.65  LATICACIDVEN  --  1.1 0.9  --   --     Estimated Creatinine Clearance: 37.9 mL/min (by C-G formula based on SCr of 0.65 mg/dL).    Allergies  Allergen Reactions  . Penicillins Rash    Has patient had a PCN reaction causing immediate rash, facial/tongue/throat swelling, SOB or lightheadedness with hypotension: Yes Has patient had a PCN reaction causing severe rash involving mucus membranes or skin necrosis: No Has patient had a PCN reaction that required hospitalization: No Has patient had a PCN reaction occurring within the last 10 years: No If all of the above answers are "NO", then may proceed with Cephalosporin use.    Thank you for allowing pharmacy to be a part of this patient's care.  Antimicrobials: Azithromycin 1/6 > Ceftriaxone 1/2 > doxyxyline 1/3 > 1/6  Lenis Noon, PharmD, BCPS Clinical Pharmacist 11/10/2017

## 2017-11-10 NOTE — Progress Notes (Signed)
PHARMACIST - PHYSICIAN COMMUNICATION DR:   Manuella Ghazi CONCERNING: Antibiotic IV to Oral Route Change Policy  RECOMMENDATION: This patient is receiving azithromycin by the intravenous route.  Based on criteria approved by the Pharmacy and Therapeutics Committee, the antibiotic(s) is/are being converted to the equivalent oral dose form(s).   DESCRIPTION: These criteria include:  Patient being treated for a respiratory tract infection, urinary tract infection, cellulitis or clostridium difficile associated diarrhea if on metronidazole  The patient is not neutropenic and does not exhibit a GI malabsorption state  The patient is eating (either orally or via tube) and/or has been taking other orally administered medications for a least 24 hours  The patient is improving clinically and has a Tmax < 100.5  If you have questions about this conversion, please contact the [place], PharmD 11/10/17 9:37 AM

## 2017-11-10 NOTE — Evaluation (Addendum)
Clinical/Bedside Swallow Evaluation Patient Details  Name: Michele [name] MRN: [id number] Date of Birth: Jun 06, 1924  Today's Date: 11/10/2017 Time: SLP Start Time (ACUTE ONLY): 1135 SLP Stop Time (ACUTE ONLY): 1235 SLP Time Calculation (min) (ACUTE ONLY): 60 min  Past Medical History:  Past Medical History:  Diagnosis Date  . MI (myocardial infarction) ([place])   . Stroke [facility])    Past Surgical History:  Past Surgical History:  Procedure Laterality Date  . ABDOMINAL HYSTERECTOMY    . APPENDECTOMY    . ROTATOR CUFF REPAIR Left    HPI:  Pt is a 84 y.o. female with a known history of coronary artery disease and stroke in the past.  Patient was brought to emergency room by the family for 1 week history of cough and chest congestion gradually getting worse despite even using antibiotics as outpatient.  Patient denies any fever or chills however she has chest pain with cough.  No sore shortness of breath with exertion going on for the past week.  Pt has been gradually weaker and had several falls at home per family. Pt does have a caregiver and Son who assist her at home. Pt denies any difficulty swallowing; denies choking/coughing when eating at home.    Assessment / Plan / Recommendation Clinical Impression  Pt appears to present w/ adequate oropharyngeal phase swallowing function w/ no overt difficulties or deficits noted. Pt appears at reduced risk for aspiration following general aspiration precautions though she is of advanced age and currently experiencing declined respiratory status and deconditioned d/t extended illness. Pt has a chronic, congested cough at baseline clearing min+ amount of phlegm each time she coughs and expectorates. Pt exhibited no immediate, overt s/s of aspiration w/ all trials given; all swallowing was timely. Pt consumed trials of thin liquids via cup/straw then purees and soft solids w/ no immediate coughing or throat clearing following; clear vocal quality b/f trials.  Pt was eager to talk and was discouraged from doing so when eating/drinking. Unsure of pt's baseline Cognitive functioning but instructions had to be repeated intermittently. Pt also has a baseline congested cough which was present intermittently during the po trials but did not increase in intensity or frequency w/ the po trials. No oral motor weakness noted during bolus management; adequate oral phase management and clearing. Recommend a mech soft/regular diet consistency w/ thin liquids w/ general aspiration precautions. Recommend Pills in puree if easier/safer for swallowing - teach family this alternative. Recommend tray prep and positioning upright for meals(while in hospital). NSG updated and reported she had not noted pt having any difficulty swallowing w/ meals or w/ medicines. If silent aspiration is suspected, then an objective swallow assessment would be indicated per MD ok. ST services will be available for further education w/ pt/family while admitted.  SLP Visit Diagnosis: Dysphagia, unspecified (R13.10)    Aspiration Risk  (reduced risk following general aspiration precautions)    Diet Recommendation  Regular diet w/ meats cut and moistened foods for easier mastication; general aspiration precautions.   Medication Administration: Whole meds with puree(for easier/safer swallowing if needed)    Other  Recommendations Recommended Consults: (n/a at this time) Oral Care Recommendations: Oral care BID;Staff/trained caregiver to provide oral care;Patient independent with oral care Other Recommendations: (n/a)   Follow up Recommendations None(at this time)      Frequency and Duration (n/a)  (n/a)       Prognosis Prognosis for Safe Diet Advancement: Good Barriers to Reach Goals: (advanced age)  Swallow Study   General Date of Onset: 11/05/17 HPI: Pt is a 84 y.o. female with a known history of coronary artery disease and stroke in the past.  Patient was brought to emergency  room by the family for 1 week history of cough and chest congestion gradually getting worse despite even using antibiotics as outpatient.  Patient denies any fever or chills however she has chest pain with cough.  No sore shortness of breath with exertion going on for the past week.  Pt has been gradually weaker and had several falls at home per family. Pt does have a caregiver and Son who assist her at home. Pt denies any difficulty swallowing; denies choking/coughing when eating at home.  Type of Study: Bedside Swallow Evaluation Previous Swallow Assessment: none reported Diet Prior to this Study: Regular;Thin liquids Temperature Spikes Noted: No(wbc not elevated; 7.6) Respiratory Status: Room air History of Recent Intubation: No Behavior/Cognition: Alert;Cooperative;Pleasant mood;Distractible;Requires cueing Oral Cavity Assessment: Within Functional Limits Oral Care Completed by SLP: Recent completion by staff Oral Cavity - Dentition: Dentures, top;Dentures, bottom Vision: Functional for self-feeding Self-Feeding Abilities: Able to feed self;Needs set up Patient Positioning: Upright in bed Baseline Vocal Quality: Normal Volitional Cough: Strong;Congested Volitional Swallow: Able to elicit    Oral/Motor/Sensory Function Overall Oral Motor/Sensory Function: Within functional limits   Ice Chips Ice chips: Not tested   Thin Liquid Thin Liquid: Within functional limits Presentation: Cup;Self Fed;Straw(9-10 trials) Other Comments: encouraged pt to drink slowly and use small sips - less talking while eating/drinking    Nectar Thick Nectar Thick Liquid: Not tested   Honey Thick Honey Thick Liquid: Not tested   Puree Puree: Within functional limits Presentation: Self Fed;Spoon(5 trials)   Solid   GO   Solid: Within functional limits Presentation: Spoon;Self Fed(3 trials) Other Comments: softened foods         Michele Kenner, MS, CCC-SLP [place],Michele [name] 11/10/2017,1:58  PM

## 2017-11-10 NOTE — Clinical Social Work Note (Addendum)
CSW spoke to patient's daughter [name] [phone], and presented bed offers.  Patient's family would like [facility], however there are not any beds available.  CSW updated patient's daughter and she agreed to have patient go to [facility].  [facility] has started insurance authorization.  Patient's family have agreed to pay for SNF privately if insurance does not approve for rehab.  CSW to continue to follow patient's progress, throughout discharge planning.  CSW still waiting on [facility] number, patient can not discharge to SNF until [facility] number has been received.  Jones Broom. [place], MSW, [place]  11/10/2017 3:48 PM

## 2017-11-10 NOTE — Progress Notes (Signed)
[facility]                                                                                                                                                                                  Patient Demographics   Michele [name], is a 84 y.o. female, DOB - 02-Nov-1924, HGD:[id number]  Admit date - 11/05/2017   Admitting Physician Amelia Jo, MD  Outpatient Primary MD for the patient is Adin Hector, MD   LOS - 4  Subjective: Feels much better, grand daughter at bedside.  Review of Systems:   CONSTITUTIONAL: No documented fever.  Positive fatigue positive, weakness. No weight gain, no weight loss.  EYES: No blurry or double vision.  ENT: No tinnitus. No postnasal drip. No redness of the oropharynx.  RESPIRATORY: Positive cough, no wheeze, no hemoptysis.  No dyspnea.  CARDIOVASCULAR: No chest pain. No orthopnea. No palpitations. No syncope.  GASTROINTESTINAL: No nausea, no vomiting or diarrhea. No abdominal pain. No melena or hematochezia.  GENITOURINARY: No dysuria or hematuria.  ENDOCRINE: No polyuria or nocturia. No heat or cold intolerance.  HEMATOLOGY: No anemia. No bruising. No bleeding.  INTEGUMENTARY: No rashes. No lesions.  MUSCULOSKELETAL: No arthritis. No swelling. No gout.  NEUROLOGIC: No numbness, tingling, or ataxia. No seizure-type activity.  PSYCHIATRIC: No anxiety. No insomnia. No ADD.    Vitals:   Vitals:   11/10/17 0303 11/10/17 0500 11/10/17 1344 11/10/17 1358  BP: (!) 164/74   (!) 117/57  Pulse: 74  69 65  Resp: 19  18   Temp: 98.4 F (36.9 C)  98.4 F (36.9 C)   TempSrc: Oral  Oral   SpO2: 96%  99%   Weight:  59.2 kg (130 lb 9.6 oz)    Height:        Wt Readings from Last 3 Encounters:  11/10/17 59.2 kg (130 lb 9.6 oz)     Intake/Output Summary (Last 24 hours) at 11/10/2017 1505 Last data filed at 11/10/2017 1300 Gross per 24 hour  Intake 660 ml  Output 400 ml  Net 260 ml    Physical Exam:   GENERAL:  Pleasant-appearing in no apparent distress.  HEAD, EYES, EARS, NOSE AND THROAT: Atraumatic, normocephalic. Extraocular muscles are intact. Pupils equal and reactive to light. Sclerae anicteric. No conjunctival injection. No oro-pharyngeal erythema.  NECK: Supple. There is no jugular venous distention. No bruits, no lymphadenopathy, no thyromegaly.  HEART: Regular rate and rhythm,. No murmurs, no rubs, no clicks.  LUNGS: Rhonchus breath sounds bilaterally no accessory muscle usage ABDOMEN: Soft, flat, nontender, nondistended. Has good bowel sounds. No hepatosplenomegaly appreciated.  EXTREMITIES: No evidence of any cyanosis, clubbing,  or peripheral edema.  +2 pedal and radial pulses bilaterally.  NEUROLOGIC: The patient is alert, awake, and oriented x3 with no focal motor or sensory deficits appreciated bilaterally.  SKIN: Moist and warm with no rashes appreciated.  Psych: Not anxious, depressed LN: No inguinal LN enlargement    Antibiotics   Anti-infectives (From admission, onward)   Start     Dose/Rate Route Frequency Ordered Stop   11/11/17 1000  azithromycin (ZITHROMAX) tablet 500 mg     500 mg Oral Daily 11/10/17 0937     11/09/17 1100  azithromycin (ZITHROMAX) 500 mg in dextrose 5 % 250 mL IVPB     500 mg 250 mL/hr over 60 Minutes Intravenous Every 24 hours 11/09/17 1035 11/10/17 1406   11/06/17 1000  cefTRIAXone (ROCEPHIN) 1 g in dextrose 5 % 50 mL IVPB - Premix  Status:  Discontinued     1 g 100 mL/hr over 30 Minutes Intravenous Every 24 hours 11/06/17 0243 11/06/17 0432   11/06/17 1000  azithromycin (ZITHROMAX) 500 mg in dextrose 5 % 250 mL IVPB  Status:  Discontinued     500 mg 250 mL/hr over 60 Minutes Intravenous Every 24 hours 11/06/17 0244 11/06/17 0332   11/06/17 1000  cefTRIAXone (ROCEPHIN) 1 g in dextrose 5 % 50 mL IVPB     1 g 100 mL/hr over 30 Minutes Intravenous Every 24 hours 11/06/17 0432     11/06/17 0345  doxycycline (VIBRAMYCIN) 100 mg in dextrose 5 % 250 mL  IVPB  Status:  Discontinued     100 mg 125 mL/hr over 120 Minutes Intravenous Every 12 hours 11/06/17 0332 11/09/17 1035   11/06/17 0015  cefTRIAXone (ROCEPHIN) 1 g in dextrose 5 % 50 mL IVPB  Status:  Discontinued     1 g 100 mL/hr over 30 Minutes Intravenous Every 24 hours 11/06/17 0007 11/06/17 0028   11/06/17 0015  azithromycin (ZITHROMAX) 500 mg in dextrose 5 % 250 mL IVPB  Status:  Discontinued     500 mg 250 mL/hr over 60 Minutes Intravenous Every 24 hours 11/06/17 0007 11/06/17 0028   11/05/17 2245  cefTRIAXone (ROCEPHIN) 1 g in dextrose 5 % 50 mL IVPB - Premix     1 g 100 mL/hr over 30 Minutes Intravenous  Once 11/05/17 2241 11/05/17 2352   11/05/17 2245  azithromycin (ZITHROMAX) 500 mg in dextrose 5 % 250 mL IVPB     500 mg 250 mL/hr over 60 Minutes Intravenous  Once 11/05/17 2241 11/06/17 0031      Medications   Scheduled Meds: . [START ON 11/11/2017] azithromycin  500 mg Oral Daily  . benzonatate  200 mg Oral TID  . busPIRone  5 mg Oral BID  . diltiazem  120 mg Oral Daily  . docusate sodium  100 mg Oral BID  . escitalopram  5 mg Oral Daily  . guaiFENesin  600 mg Oral BID  . heparin  5,000 Units Subcutaneous Q8H  . ipratropium-albuterol  3 mL Nebulization BID  . magnesium oxide  400 mg Oral BID  . metoprolol succinate  25 mg Oral Daily  . ramipril  10 mg Oral BID   Continuous Infusions: . cefTRIAXone (ROCEPHIN) IVPB 1 gram/50 mL D5W Stopped (11/10/17 1006)   PRN Meds:.acetaminophen **OR** acetaminophen, ALPRAZolam, bisacodyl, guaiFENesin-codeine, HYDROcodone-acetaminophen, ipratropium-albuterol, ondansetron **OR** ondansetron (ZOFRAN) IV, traZODone   Data Review:   Micro Results Recent Results (from the past 240 hour(s))  Blood Culture (routine x 2)     Status: None  Collection Time: 11/05/17 11:05 PM  Result Value Ref Range Status   Specimen Description BLOOD LEFT ANTECUBITAL  Final   Special Requests   Final    BOTTLES DRAWN AEROBIC AND ANAEROBIC Blood  Culture adequate volume   Culture   Final    NO GROWTH 5 DAYS Performed at [facility], [address], [place], [place] [address]    Report Status 11/10/2017 FINAL  Final  Blood Culture (routine x 2)     Status: None   Collection Time: 11/05/17 11:05 PM  Result Value Ref Range Status   Specimen Description BLOOD BLOOD RIGHT FOREARM  Final   Special Requests   Final    BOTTLES DRAWN AEROBIC AND ANAEROBIC Blood Culture adequate volume   Culture   Final    NO GROWTH 5 DAYS Performed at [facility], [address], [place], [place] [address]    Report Status 11/10/2017 FINAL  Final    Radiology Reports Dg Chest 2 View  Result Date: 11/08/2017 CLINICAL DATA:  Pneumonia, history MI, stroke EXAM: CHEST  2 VIEW COMPARISON:  11/05/2017 FINDINGS: Borderline enlargement of cardiac silhouette. Atherosclerotic calcification aorta. Mediastinal contours and pulmonary vascularity otherwise normal. Bronchitic changes with bibasilar infiltrates as well as RIGHT upper lobe infiltrate just above the minor fissure, little changed. No new areas of consolidation, pleural effusion or pneumothorax. Diffuse osseous demineralization. IMPRESSION: Persistent bronchitic changes and BILATERAL pulmonary infiltrates, not significantly changed. Electronically Signed   By: Lavonia Dana M.D.   On: 11/08/2017 14:17   Dg Chest 2 View  Result Date: 11/05/2017 CLINICAL DATA:  Patient seen Dr Caryl Comes 2 days ago and went from zpak to doxycycline for bilateral pneumonia; having productive cough yellow sputum x 2 weeks, at times brown with sinus congestion AND pressure; patient reports discomfort to chest from coughing as well as headache ; history of aneurysm EXAM: CHEST  2 VIEW COMPARISON:  11/27/2016 FINDINGS: Cardiac enlargement. No vascular congestion or edema. There is diffuse interstitial pattern to the lungs with peribronchial thickening likely representing diffuse bronchitis. This could be either acute or  chronic. There is a focal irregular opacity over the right mid lung. This could represent focal pneumonia, mucous plugging, or a parenchymal nodule. This is new since previous study. Recommend follow-up after treatment of acute process. Calcified granulomas in the left lung. Calcified and tortuous aorta. Degenerative changes throughout the spine. IMPRESSION: Bronchitic changes in the lungs may represent acute or chronic bronchitis. Focal opacity in the right mid lung could represent focal pneumonia, mucous plugging, or parenchymal nodule. Followup PA and lateral chest X-ray is recommended in 3-4 weeks following trial of appropriate clinical therapy to ensure resolution and exclude underlying malignancy. Bronchiectasis. Aortic atherosclerosis. Electronically Signed   By: Lucienne Capers M.D.   On: 11/05/2017 20:46   Ct Head Wo Contrast  Result Date: 11/05/2017 CLINICAL DATA:  Acute confusion.  Headache. EXAM: CT HEAD WITHOUT CONTRAST TECHNIQUE: Contiguous axial images were obtained from the base of the skull through the vertex without intravenous contrast. 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 No acute hemorrhage or evidence of acute ischemia. Age related atrophy and chronic small vessel ischemic change. Remote lacunar infarcts in the right cerebellum. No hydrocephalus, mass effect, or midline shift. No subdural or extra-axial fluid collection. Vascular: No hyperdense vessel. 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 Skullbase atherosclerosis. Skull:  No fracture or focal lesion. Sinuses/Orbits: Mucosal thickening with fluid level on the left maxillary sinus. Opacification of scattered left ethmoid air cells. The mastoid air cells are clear. Other: None. IMPRESSION: 1.  No acute intracranial abnormality. 2. Remote infract in the right parietooccipital  lobe. Age related atrophy and chronic small vessel ischemia. Electronically Signed   By: Jeb Levering M.D.   On: 11/05/2017 21:09   Ct Chest W Contrast  Result Date: 11/08/2017 CLINICAL DATA:  Cough. EXAM: CT CHEST WITH CONTRAST TECHNIQUE: Multidetector CT imaging of the chest was performed during intravenous contrast administration. CONTRAST:  8mL ISOVUE-300 IOPAMIDOL (ISOVUE-300) INJECTION 61% COMPARISON:  None. FINDINGS: Cardiovascular: Heart size upper normal to mildly increased. Atherosclerotic calcification is noted in the wall of the thoracic aorta. Coronary artery calcification is evident. Enlargement of the main pulmonary arteries suggests pulmonary arterial hypertension. Mediastinum/Nodes: No mediastinal lymphadenopathy. There is no hilar lymphadenopathy. The esophagus has normal imaging features. Small hiatal hernia. There is no axillary lymphadenopathy. Lungs/Pleura: Subtle subpleural reticulation noted bilaterally with a basilar predominance. Circumferential bronchial wall thickening evident. There is patchy airspace disease in the posterior right upper lobe. Inferior lung bases not well evaluated given motion artifact, but probable component of small airway impaction suspected with patchy airspace opacity. Upper Abdomen: Unremarkable. Musculoskeletal: Bone windows reveal no worrisome lytic or sclerotic osseous lesions. IMPRESSION: 1. Patchy airspace opacity posterior right upper lobe compatible with pneumonia. Follow-up imaging recommended to ensure resolution. 2. Bronchial wall thickening with probable small airway impaction and patchy airspace opacity in the posterior lung bases. Atypical infection could have this appearance. Aspiration could present similarly in the appropriate clinical setting. This could also be reassessed at the time of followup. 3. Enlargement of pulmonary arteries suggests pulmonary arterial hypertension. 4. Small hiatal hernia. 5.  Aortic Atherosclerois (ICD10-170.0)  Electronically Signed   By: Misty Stanley M.D.   On: 11/08/2017 19:46     CBC Recent Labs  Lab 11/05/17 2054 11/06/17 0414 11/09/17 0429  WBC 10.9 8.9 7.6  HGB 12.5 12.0 11.8*  HCT 36.4 35.7 35.2  PLT 394 333 384  MCV 87.4 87.8 88.4  MCH 29.9 29.5 29.7  MCHC 34.2 33.6 33.6  RDW 13.5 13.5 13.6    Chemistries  Recent Labs  Lab 11/05/17 2054 11/06/17 0414 11/07/17 0547 11/09/17 0429  NA 136 139 140 142  K 3.0* 3.0* 3.4* 3.6  CL 100* 106 108 108  CO2 24 24 25 25   GLUCOSE 136* 110* 119* 99  BUN 14 11 <5* 8  CREATININE 0.89 0.71 0.61 0.65  CALCIUM 9.5 9.0 9.1 9.1  MG  --   --  1.6*  --    ------------------------------------------------------------------------------------------------------------------ estimated creatinine clearance is 37.9 mL/min (by C-G formula based on SCr of 0.65 mg/dL). ------------------------------------------------------------------------------------------------------------------ No results for input(s): HGBA1C in the last 72 hours. ------------------------------------------------------------------------------------------------------------------ No results for input(s): CHOL, HDL, LDLCALC, TRIG, CHOLHDL, LDLDIRECT in the last 72 hours. ------------------------------------------------------------------------------------------------------------------ No results for input(s): TSH, T4TOTAL, T3FREE, THYROIDAB in the last 72 hours.  Invalid input(s): FREET3 ------------------------------------------------------------------------------------------------------------------ No results for input(s): VITAMINB12, FOLATE, FERRITIN, TIBC, IRON, RETICCTPCT in the last 72 hours.  Coagulation profile No results for input(s): INR, PROTIME in the last 168 hours.  No results for input(s): DDIMER in the last 72 hours.  Cardiac Enzymes Recent Labs  Lab 11/05/17 2054 11/06/17 0612  TROPONINI 0.04* 0.03*    ------------------------------------------------------------------------------------------------------------------ Invalid input(s): POCBNP    Assessment & Plan   IMPRESSION AND PLAN: 1.  Pneumonia: seen on CT,  continue rocephin + zithromax 2.  Acute metabolic encephalopathy: likely due to delirium due to underlying infection. 3.    Elevated troponin due to demand ischemia no further workup needed at this point 4.    Hypokalemia: repleted and resolved 5.  Generalized anxiety continue alprazolam and BuSpar and Lexapro 6.  Essential hypertension continue metoprolol and ramipril  Generalized weakness patient's daughter interested in patient going to rehab states that it will pay out of pocket if needed     Code Status Orders  (From admission, onward)        Start     Ordered   11/06/17 0353  Full code  Continuous     11/06/17 0352    Code Status History    Date Active Date Inactive Code Status Order ID Comments User Context   This patient has a current code status but no historical code status.    Advance Directive Documentation     Most Recent Value  Type of Advance Directive  Healthcare Power of Attorney, Living will  Pre-existing out of facility DNR order (yellow form or pink MOST form)  No data  "MOST" Form in Place?  No data         Case d/w patient's daughter over phone. She is agreeable with D/C to [facility] (likely tomorrow) once we get P[facility] (pending per CSW).   DVT Prophylaxis  Lovenox   Lab Results  Component Value Date   PLT 384 11/09/2017     Time Spent in minutes 35 minutes  Greater than 50% of time spent in care coordination and counseling patient regarding the condition and plan of care.   Max Sane M.D on 11/10/2017 at 3:05 PM  Between 7am to 6pm - Pager - [phone]  After 6pm go to [email or web address] - password [email or web address] [facility]   Office  [phone]

## 2017-11-11 LAB — GLUCOSE, CAPILLARY: Glucose-Capillary: 90 mg/dL (ref 65–99)

## 2017-11-11 MED ORDER — DOXYCYCLINE HYCLATE 100 MG PO TABS: 100.0000 mg | ORAL_TABLET | Freq: Two times a day (BID) | ORAL | 0 refills | Status: DC

## 2017-11-11 MED ORDER — ALPRAZOLAM 0.25 MG PO TABS: 0.2500 mg | ORAL_TABLET | Freq: Every evening | ORAL | 0 refills | Status: DC | PRN

## 2017-11-11 MED ORDER — VALACYCLOVIR HCL 500 MG PO TABS
1000.0000 mg | ORAL_TABLET | Freq: Two times a day (BID) | ORAL | Status: DC
  Administered 2017-11-11 – 2017-11-12 (×2): 1000 mg via ORAL
  Filled 2017-11-11 (×4): qty 2

## 2017-11-11 MED ORDER — VALACYCLOVIR HCL 500 MG PO TABS: 1000.0000 mg | ORAL_TABLET | Freq: Three times a day (TID) | ORAL | Status: DC

## 2017-11-11 NOTE — Clinical Social Work Note (Addendum)
CSW has received [facility] number which is [phone].  CSW updated [facility], who are still waiting for insurance authorization.  12:15pm  CSW received phone call from [facility] who said they have received insurance authorization, however patient may have Shingles per MD.  [facility] would like confirmation of shingles or not before she discharges to SNF.  CSW updated physician, who cancelled discharge until results come back.  CSW contacted patient's daughter to update her on the situation.  Daughter expressed appreciation of updated information.  CSW to continue to follow patient's progress throughout discharge planning.  Jones Broom. [place], MSW, [place]  11/11/2017 11:34 AM

## 2017-11-11 NOTE — Progress Notes (Signed)
[place] [facility]                                                                                                                                                                                  Patient Demographics   Michele [name], is a 84 y.o. female, DOB - 1924-02-25, ERD:[id number]  Admit date - 11/05/2017   Admitting Physician Amelia Jo, MD  Outpatient Primary MD for the patient is Tama High III, MD   LOS - 5  Subjective: Feels ok, shows new lesion over chest wall - noticed this am  Review of Systems:   CONSTITUTIONAL: No documented fever.  Positive fatigue positive, weakness. No weight gain, no weight loss.  EYES: No blurry or double vision.  ENT: No tinnitus. No postnasal drip. No redness of the oropharynx.  RESPIRATORY: Positive cough, no wheeze, no hemoptysis.  No dyspnea.  CARDIOVASCULAR: No chest pain. No orthopnea. No palpitations. No syncope.  GASTROINTESTINAL: No nausea, no vomiting or diarrhea. No abdominal pain. No melena or hematochezia.  GENITOURINARY: No dysuria or hematuria.  ENDOCRINE: No polyuria or nocturia. No heat or cold intolerance.  HEMATOLOGY: No anemia. No bruising. No bleeding.  INTEGUMENTARY: + rashes. No lesions.  MUSCULOSKELETAL: No arthritis. No swelling. No gout.  NEUROLOGIC: No numbness, tingling, or ataxia. No seizure-type activity.  PSYCHIATRIC: No anxiety. No insomnia. No ADD.    Vitals:   Vitals:   11/10/17 1956 11/11/17 0407 11/11/17 0759 11/11/17 1434  BP:  (!) 154/69  130/65  Pulse:  75  70  Resp:  16    Temp:  97.8 F (36.6 C)  98.4 F (36.9 C)  TempSrc:  Oral  Oral  SpO2: 98% 97% 96% 97%  Weight:  59 kg (130 lb 1.6 oz)    Height:        Wt Readings from Last 3 Encounters:  11/11/17 59 kg (130 lb 1.6 oz)     Intake/Output Summary (Last 24 hours) at 11/11/2017 1711 Last data filed at 11/11/2017 1242 Gross per 24 hour  Intake 50 ml  Output -  Net 50 ml    Physical Exam:   GENERAL:  Pleasant-appearing in no apparent distress.  HEAD, EYES, EARS, NOSE AND THROAT: Atraumatic, normocephalic. Extraocular muscles are intact. Pupils equal and reactive to light. Sclerae anicteric. No conjunctival injection. No oro-pharyngeal erythema.  NECK: Supple. There is no jugular venous distention. No bruits, no lymphadenopathy, no thyromegaly.  HEART: Regular rate and rhythm,. No murmurs, no rubs, no clicks.  LUNGS: Rhonchus breath sounds bilaterally no accessory muscle usage ABDOMEN: Soft, flat, nontender, nondistended. Has good bowel sounds. No hepatosplenomegaly appreciated.  EXTREMITIES: No evidence of  any cyanosis, clubbing, or peripheral edema.  +2 pedal and radial pulses bilaterally.  NEUROLOGIC: The patient is alert, awake, and oriented x3 with no focal motor or sensory deficits appreciated bilaterally.  SKIN: vesicular lesions over shoulder and rt chest.  Psych: Not anxious, depressed LN: No inguinal LN enlargement    Antibiotics   Anti-infectives (From admission, onward)   Start     Dose/Rate Route Frequency Ordered Stop   11/11/17 2200  valACYclovir (VALTREX) tablet 1,000 mg     1,000 mg Oral 2 times daily 11/11/17 1644 11/18/17 2159   11/11/17 1645  valACYclovir (VALTREX) tablet 1,000 mg  Status:  Discontinued     1,000 mg Oral 3 times daily 11/11/17 1644 11/11/17 1644   11/11/17 1000  azithromycin (ZITHROMAX) tablet 500 mg  Status:  Discontinued     500 mg Oral Daily 11/10/17 0937 11/11/17 1647   11/11/17 0000  doxycycline (VIBRA-TABS) 100 MG tablet     100 mg Oral 2 times daily 11/11/17 0713     11/09/17 1100  azithromycin (ZITHROMAX) 500 mg in dextrose 5 % 250 mL IVPB     500 mg 250 mL/hr over 60 Minutes Intravenous Every 24 hours 11/09/17 1035 11/10/17 1406   11/06/17 1000  cefTRIAXone (ROCEPHIN) 1 g in dextrose 5 % 50 mL IVPB - Premix  Status:  Discontinued     1 g 100 mL/hr over 30 Minutes Intravenous Every 24 hours 11/06/17 0243 11/06/17 0432   11/06/17 1000   azithromycin (ZITHROMAX) 500 mg in dextrose 5 % 250 mL IVPB  Status:  Discontinued     500 mg 250 mL/hr over 60 Minutes Intravenous Every 24 hours 11/06/17 0244 11/06/17 0332   11/06/17 1000  cefTRIAXone (ROCEPHIN) 1 g in dextrose 5 % 50 mL IVPB  Status:  Discontinued     1 g 100 mL/hr over 30 Minutes Intravenous Every 24 hours 11/06/17 0432 11/11/17 1647   11/06/17 0345  doxycycline (VIBRAMYCIN) 100 mg in dextrose 5 % 250 mL IVPB  Status:  Discontinued     100 mg 125 mL/hr over 120 Minutes Intravenous Every 12 hours 11/06/17 0332 11/09/17 1035   11/06/17 0015  cefTRIAXone (ROCEPHIN) 1 g in dextrose 5 % 50 mL IVPB  Status:  Discontinued     1 g 100 mL/hr over 30 Minutes Intravenous Every 24 hours 11/06/17 0007 11/06/17 0028   11/06/17 0015  azithromycin (ZITHROMAX) 500 mg in dextrose 5 % 250 mL IVPB  Status:  Discontinued     500 mg 250 mL/hr over 60 Minutes Intravenous Every 24 hours 11/06/17 0007 11/06/17 0028   11/05/17 2245  cefTRIAXone (ROCEPHIN) 1 g in dextrose 5 % 50 mL IVPB - Premix     1 g 100 mL/hr over 30 Minutes Intravenous  Once 11/05/17 2241 11/05/17 2352   11/05/17 2245  azithromycin (ZITHROMAX) 500 mg in dextrose 5 % 250 mL IVPB     500 mg 250 mL/hr over 60 Minutes Intravenous  Once 11/05/17 2241 11/06/17 0031      Medications   Scheduled Meds: . benzonatate  200 mg Oral TID  . busPIRone  5 mg Oral BID  . diltiazem  120 mg Oral Daily  . docusate sodium  100 mg Oral BID  . escitalopram  5 mg Oral Daily  . guaiFENesin  600 mg Oral BID  . heparin  5,000 Units Subcutaneous Q8H  . ipratropium-albuterol  3 mL Nebulization BID  . magnesium oxide  400 mg Oral BID  .  metoprolol succinate  25 mg Oral Daily  . ramipril  10 mg Oral BID  . valACYclovir  1,000 mg Oral BID   Continuous Infusions:  PRN Meds:.acetaminophen **OR** acetaminophen, ALPRAZolam, bisacodyl, guaiFENesin-codeine, HYDROcodone-acetaminophen, ipratropium-albuterol, ondansetron **OR** ondansetron (ZOFRAN)  IV, traZODone   Data Review:   Micro Results Recent Results (from the past 240 hour(s))  Blood Culture (routine x 2)     Status: None   Collection Time: 11/05/17 11:05 PM  Result Value Ref Range Status   Specimen Description BLOOD LEFT ANTECUBITAL  Final   Special Requests   Final    BOTTLES DRAWN AEROBIC AND ANAEROBIC Blood Culture adequate volume   Culture   Final    NO GROWTH 5 DAYS Performed at [facility], [address] [place], [place] [address]    Report Status 11/10/2017 FINAL  Final  Blood Culture (routine x 2)     Status: None   Collection Time: 11/05/17 11:05 PM  Result Value Ref Range Status   Specimen Description BLOOD BLOOD RIGHT FOREARM  Final   Special Requests   Final    BOTTLES DRAWN AEROBIC AND ANAEROBIC Blood Culture adequate volume   Culture   Final    NO GROWTH 5 DAYS Performed at [facility], [address] [place], [place] [address]    Report Status 11/10/2017 FINAL  Final    Radiology Reports Dg Chest 2 View  Result Date: 11/08/2017 CLINICAL DATA:  Pneumonia, history MI, stroke EXAM: CHEST  2 VIEW COMPARISON:  11/05/2017 FINDINGS: Borderline enlargement of cardiac silhouette. Atherosclerotic calcification aorta. Mediastinal contours and pulmonary vascularity otherwise normal. Bronchitic changes with bibasilar infiltrates as well as RIGHT upper lobe infiltrate just above the minor fissure, little changed. No new areas of consolidation, pleural effusion or pneumothorax. Diffuse osseous demineralization. IMPRESSION: Persistent bronchitic changes and BILATERAL pulmonary infiltrates, not significantly changed. Electronically Signed   By: Lavonia Dana M.D.   On: 11/08/2017 14:17   Dg Chest 2 View  Result Date: 11/05/2017 CLINICAL DATA:  Patient seen Dr Caryl Comes 2 days ago and went from zpak to doxycycline for bilateral pneumonia; having productive cough yellow sputum x 2 weeks, at times brown with sinus congestion AND pressure; patient  reports discomfort to chest from coughing as well as headache ; history of aneurysm EXAM: CHEST  2 VIEW COMPARISON:  11/27/2016 FINDINGS: Cardiac enlargement. No vascular congestion or edema. There is diffuse interstitial pattern to the lungs with peribronchial thickening likely representing diffuse bronchitis. This could be either acute or chronic. There is a focal irregular opacity over the right mid lung. This could represent focal pneumonia, mucous plugging, or a parenchymal nodule. This is new since previous study. Recommend follow-up after treatment of acute process. Calcified granulomas in the left lung. Calcified and tortuous aorta. Degenerative changes throughout the spine. IMPRESSION: Bronchitic changes in the lungs may represent acute or chronic bronchitis. Focal opacity in the right mid lung could represent focal pneumonia, mucous plugging, or parenchymal nodule. Followup PA and lateral chest X-ray is recommended in 3-4 weeks following trial of appropriate clinical therapy to ensure resolution and exclude underlying malignancy. Bronchiectasis. Aortic atherosclerosis. Electronically Signed   By: Lucienne Capers M.D.   On: 11/05/2017 20:46   Ct Head Wo Contrast  Result Date: 11/05/2017 CLINICAL DATA:  Acute confusion.  Headache. EXAM: CT HEAD WITHOUT CONTRAST TECHNIQUE: Contiguous axial images were obtained from the base of the skull through the vertex without intravenous contrast. COMPARISON:  Head CT and brain MRI April  2012 FINDINGS: Brain: Remote infarct in the right posterior parietooccipital lobes. No acute hemorrhage or evidence of acute ischemia. Age related atrophy and chronic small vessel ischemic change. Remote lacunar infarcts in the right cerebellum. No hydrocephalus, mass effect, or midline shift. No subdural or extra-axial fluid collection. Vascular: No hyperdense vessel. 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 Skullbase atherosclerosis. Skull: No fracture or focal lesion. Sinuses/Orbits: Mucosal thickening with fluid level on the left maxillary sinus. Opacification of scattered left ethmoid air cells. The mastoid air cells are clear. Other: None. IMPRESSION: 1.  No acute intracranial abnormality. 2. Remote infract in the right parietooccipital lobe. Age related atrophy and chronic small vessel ischemia. Electronically Signed   By: Jeb Levering M.D.   On: 11/05/2017 21:09   Ct Chest W Contrast  Result Date: 11/08/2017 CLINICAL DATA:  Cough. EXAM: CT CHEST WITH CONTRAST TECHNIQUE: Multidetector CT imaging of the chest was performed during intravenous contrast administration. CONTRAST:  89mL ISOVUE-300 IOPAMIDOL (ISOVUE-300) INJECTION 61% COMPARISON:  None. FINDINGS: Cardiovascular: Heart size upper normal to mildly increased. Atherosclerotic calcification is noted in the wall of the thoracic aorta. Coronary artery calcification is evident. Enlargement of the main pulmonary arteries suggests pulmonary arterial hypertension. Mediastinum/Nodes: No mediastinal lymphadenopathy. There is no hilar lymphadenopathy. The esophagus has normal imaging features. Small hiatal hernia. There is no axillary lymphadenopathy. Lungs/Pleura: Subtle subpleural reticulation noted bilaterally with a basilar predominance. Circumferential bronchial wall thickening evident. There is patchy airspace disease in the posterior right upper lobe. Inferior lung bases not well evaluated given motion artifact, but probable component of small airway impaction suspected with patchy airspace opacity. Upper Abdomen: Unremarkable. Musculoskeletal: Bone windows reveal no worrisome lytic or sclerotic osseous lesions. IMPRESSION: 1. Patchy airspace opacity posterior right upper lobe compatible with pneumonia. Follow-up imaging recommended to ensure resolution. 2. Bronchial wall thickening with probable small airway impaction and patchy airspace opacity in  the posterior lung bases. Atypical infection could have this appearance. Aspiration could present similarly in the appropriate clinical setting. This could also be reassessed at the time of followup. 3. Enlargement of pulmonary arteries suggests pulmonary arterial hypertension. 4. Small hiatal hernia. 5.  Aortic Atherosclerois (ICD10-170.0) Electronically Signed   By: Misty Stanley M.D.   On: 11/08/2017 19:46     CBC Recent Labs  Lab 11/05/17 2054 11/06/17 0414 11/09/17 0429  WBC 10.9 8.9 7.6  HGB 12.5 12.0 11.8*  HCT 36.4 35.7 35.2  PLT 394 333 384  MCV 87.4 87.8 88.4  MCH 29.9 29.5 29.7  MCHC 34.2 33.6 33.6  RDW 13.5 13.5 13.6    Chemistries  Recent Labs  Lab 11/05/17 2054 11/06/17 0414 11/07/17 0547 11/09/17 0429  NA 136 139 140 142  K 3.0* 3.0* 3.4* 3.6  CL 100* 106 108 108  CO2 24 24 25 25   GLUCOSE 136* 110* 119* 99  BUN 14 11 <5* 8  CREATININE 0.89 0.71 0.61 0.65  CALCIUM 9.5 9.0 9.1 9.1  MG  --   --  1.6*  --    ------------------------------------------------------------------------------------------------------------------ estimated creatinine clearance is 37.9 mL/min (by C-G formula based on SCr of 0.65 mg/dL). ------------------------------------------------------------------------------------------------------------------ No results for input(s): HGBA1C in the last 72 hours. ------------------------------------------------------------------------------------------------------------------ No results for input(s): CHOL, HDL, LDLCALC, TRIG, CHOLHDL, LDLDIRECT in the last 72 hours. ------------------------------------------------------------------------------------------------------------------ No results for input(s): TSH, T4TOTAL, T3FREE, THYROIDAB in the last 72 hours.  Invalid input(s): FREET3 ------------------------------------------------------------------------------------------------------------------ No results for input(s): VITAMINB12, FOLATE,  FERRITIN, TIBC, IRON, RETICCTPCT in the last 72 hours.  Coagulation profile No results for input(s): INR, PROTIME in the last 168 hours.  No results for input(s): DDIMER in the last 72 hours.  Cardiac Enzymes Recent Labs  Lab 11/05/17 2054 11/06/17 0612  TROPONINI 0.04* 0.03*   ------------------------------------------------------------------------------------------------------------------ Invalid input(s): POCBNP    Assessment & Plan   IMPRESSION AND PLAN: 1.  Pneumonia: seen on CT, finished Abx course. 2.  Acute metabolic encephalopathy: likely due to delirium due to underlying infection. 3.    Elevated troponin due to demand ischemia no further workup needed at this point 4.    Hypokalemia: repleted and resolved 5.  Generalized anxiety continue alprazolam and BuSpar and Lexapro 6.  Essential hypertension continue metoprolol and ramipril 7. Shingles:  - vesicular eruption noticed today - Have sent VZV PCR but can take 5-7 days to return.  - ID recommended and started valtrex 1000 BID - renal dosing- for 7 days   Per ID - Can dc to facility if they will accept. Lesions will take 7-14 days to crust over so no need to keep her here for that time.         Code Status Orders  (From admission, onward)        Start     Ordered   11/06/17 0353  Full code  Continuous     11/06/17 0352    Code Status History    Date Active Date Inactive Code Status Order ID Comments User Context   This patient has a current code status but no historical code status.    Advance Directive Documentation     Most Recent Value  Type of Advance Directive  Healthcare Power of Attorney, Living will  Pre-existing out of facility DNR order (yellow form or pink MOST form)  No data  "MOST" Form in Place?  No data         Case d/w patient's daughter over phone. She is agreeable with D/C to [facility] (likely tomorrow) if they can take her   DVT Prophylaxis  Lovenox   Lab Results   Component Value Date   PLT 384 11/09/2017     Time Spent in minutes 35 minutes  Greater than 50% of time spent in care coordination and counseling patient regarding the condition and plan of care.   Max Sane M.D on 11/11/2017 at 5:11 PM  Between 7am to 6pm - Pager - [phone]  After 6pm go to [email or web address] - password [email or web address] [facility]   Office  [phone]

## 2017-11-11 NOTE — Plan of Care (Signed)
  Progressing Education: Knowledge of General Education information will improve 11/11/2017 1731 - Progressing by Daylene Posey, RN Health Behavior/Discharge Planning: Ability to manage health-related needs will improve 11/11/2017 1731 - Progressing by Daylene Posey, RN Clinical Measurements: Ability to maintain clinical measurements within normal limits will improve 11/11/2017 1731 - Progressing by Daylene Posey, RN Will remain free from infection 11/11/2017 1731 - Progressing by Daylene Posey, RN Diagnostic test results will improve 11/11/2017 1731 - Progressing by Daylene Posey, RN Respiratory complications will improve 11/11/2017 1731 - Progressing by Daylene Posey, RN Cardiovascular complication will be avoided 11/11/2017 1731 - Progressing by Daylene Posey, RN Activity: Risk for activity intolerance will decrease 11/11/2017 1731 - Progressing by Daylene Posey, RN Nutrition: Adequate nutrition will be maintained 11/11/2017 1731 - Progressing by Daylene Posey, RN Coping: Level of anxiety will decrease 11/11/2017 1731 - Progressing by Daylene Posey, RN Elimination: Will not experience complications related to bowel motility 11/11/2017 1731 - Progressing by Daylene Posey, RN Will not experience complications related to urinary retention 11/11/2017 1731 - Progressing by Daylene Posey, RN Pain Managment: General experience of comfort will improve 11/11/2017 1731 - Progressing by Daylene Posey, RN Safety: Ability to remain free from injury will improve 11/11/2017 1731 - Progressing by Daylene Posey, RN Skin Integrity: Risk for impaired skin integrity will decrease 11/11/2017 1731 - Progressing by Daylene Posey, RN

## 2017-11-11 NOTE — Discharge Instructions (Signed)

## 2017-11-11 NOTE — Consult Note (Signed)
[facility] Infectious Disease     Reason for Consult: Shingles   Referring Physician: Carlynn Spry Date of Admission:  11/05/2017   Active Problems:   Pneumonia   HPI: Michele [name] is a 84 y.o. female admitted with cough and sob and falls. On admit wbc was 10, no fevers, cxr and CT showed infiltrate.  Started on ctx and azitrho day 7.  Developed rash on chest and shoulder that she noticed today.  Past Medical History:  Diagnosis Date  . MI (myocardial infarction) ([place])   . Stroke [facility])    Past Surgical History:  Procedure Laterality Date  . ABDOMINAL HYSTERECTOMY    . APPENDECTOMY    . ROTATOR CUFF REPAIR Left    Social History   Tobacco Use  . Smoking status: Never Smoker  . Smokeless tobacco: Never Used  Substance Use Topics  . Alcohol use: No    Frequency: Never  . Drug use: Not on file   No family history on file.  Allergies:  Allergies  Allergen Reactions  . Penicillins Rash    Has patient had a PCN reaction causing immediate rash, facial/tongue/throat swelling, SOB or lightheadedness with hypotension: Yes Has patient had a PCN reaction causing severe rash involving mucus membranes or skin necrosis: No Has patient had a PCN reaction that required hospitalization: No Has patient had a PCN reaction occurring within the last 10 years: No If all of the above answers are "NO", then may proceed with Cephalosporin use.    Current antibiotics: Antibiotics Given (last 72 hours)    Date/Time Action Medication Dose Rate   11/08/17 1656 New Bag/Given   doxycycline (VIBRAMYCIN) 100 mg in dextrose 5 % 250 mL IVPB 100 mg 125 mL/hr   11/09/17 0451 New Bag/Given   doxycycline (VIBRAMYCIN) 100 mg in dextrose 5 % 250 mL IVPB 100 mg 125 mL/hr   11/09/17 1024 New Bag/Given   cefTRIAXone (ROCEPHIN) 1 g in dextrose 5 % 50 mL IVPB 1 g 100 mL/hr   11/09/17 1114 New Bag/Given   azithromycin (ZITHROMAX) 500 mg in dextrose 5 % 250 mL IVPB 500 mg 250 mL/hr   11/10/17 0936 New Bag/Given    cefTRIAXone (ROCEPHIN) 1 g in dextrose 5 % 50 mL IVPB 1 g 100 mL/hr   11/10/17 1306 New Bag/Given   azithromycin (ZITHROMAX) 500 mg in dextrose 5 % 250 mL IVPB 500 mg 250 mL/hr   11/11/17 1146 Given   azithromycin (ZITHROMAX) tablet 500 mg 500 mg    11/11/17 1154 New Bag/Given   cefTRIAXone (ROCEPHIN) 1 g in dextrose 5 % 50 mL IVPB 1 g 100 mL/hr      MEDICATIONS: . azithromycin  500 mg Oral Daily  . benzonatate  200 mg Oral TID  . busPIRone  5 mg Oral BID  . diltiazem  120 mg Oral Daily  . docusate sodium  100 mg Oral BID  . escitalopram  5 mg Oral Daily  . guaiFENesin  600 mg Oral BID  . heparin  5,000 Units Subcutaneous Q8H  . ipratropium-albuterol  3 mL Nebulization BID  . magnesium oxide  400 mg Oral BID  . metoprolol succinate  25 mg Oral Daily  . ramipril  10 mg Oral BID    Review of Systems - 11 systems reviewed and negative per HPI   OBJECTIVE: Temp:  [97.8 F (36.6 C)-98.4 F (36.9 C)] 98.4 F (36.9 C) (01/08 1434) Pulse Rate:  [70-75] 70 (01/08 1434) Resp:  [15-16] 16 (01/08  0407) BP: (130-154)/(65-93) 130/65 (01/08 1434) SpO2:  [96 %-98 %] 97 % (01/08 1434) Weight:  [59 kg (130 lb 1.6 oz)] 59 kg (130 lb 1.6 oz) (01/08 0407) Physical Exam  Constitutional:  oriented to person, place, and time. appears well-developed and well-nourished. Thin, frail HENT: [place]/AT, PERRLA, no scleral icterus Mouth/Throat: Oropharynx is clear and moist. No oropharyngeal exudate.  Cardiovascular: Normal rate, regular rhythm and normal heart sounds.  Pulmonary/Chest: Effort normal bil rhonchi Abdominal: Soft. Bowel sounds are normal.  exhibits no distension. There is no tenderness.  Lymphadenopathy: no cervical adenopathy. No axillary adenopathy Neurological: alert and oriented to person, place, and time.  Skin: R shoulder with clusters of vesicles Psychiatric: a normal mood and affect.  behavior is normal.    LABS: Results for orders placed or performed during the hospital  encounter of 11/05/17 (from the past 48 hour(s))  Glucose, capillary     Status: None   Collection Time: 11/10/17  7:51 AM  Result Value Ref Range   Glucose-Capillary 93 65 - 99 mg/dL  Glucose, capillary     Status: None   Collection Time: 11/11/17  7:41 AM  Result Value Ref Range   Glucose-Capillary 90 65 - 99 mg/dL   No components found for: ESR, C REACTIVE PROTEIN MICRO: Recent Results (from the past 720 hour(s))  Blood Culture (routine x 2)     Status: None   Collection Time: 11/05/17 11:05 PM  Result Value Ref Range Status   Specimen Description BLOOD LEFT ANTECUBITAL  Final   Special Requests   Final    BOTTLES DRAWN AEROBIC AND ANAEROBIC Blood Culture adequate volume   Culture   Final    NO GROWTH 5 DAYS Performed at [facility], [address], [place], [place] [address]    Report Status 11/10/2017 FINAL  Final  Blood Culture (routine x 2)     Status: None   Collection Time: 11/05/17 11:05 PM  Result Value Ref Range Status   Specimen Description BLOOD BLOOD RIGHT FOREARM  Final   Special Requests   Final    BOTTLES DRAWN AEROBIC AND ANAEROBIC Blood Culture adequate volume   Culture   Final    NO GROWTH 5 DAYS Performed at [facility], [address] [place], [place] [address]    Report Status 11/10/2017 FINAL  Final    IMAGING: Dg Chest 2 View  Result Date: 11/08/2017 CLINICAL DATA:  Pneumonia, history MI, stroke EXAM: CHEST  2 VIEW COMPARISON:  11/05/2017 FINDINGS: Borderline enlargement of cardiac silhouette. Atherosclerotic calcification aorta. Mediastinal contours and pulmonary vascularity otherwise normal. Bronchitic changes with bibasilar infiltrates as well as RIGHT upper lobe infiltrate just above the minor fissure, little changed. No new areas of consolidation, pleural effusion or pneumothorax. Diffuse osseous demineralization. IMPRESSION: Persistent bronchitic changes and BILATERAL pulmonary infiltrates, not significantly changed.  Electronically Signed   By: Lavonia Dana M.D.   On: 11/08/2017 14:17   Dg Chest 2 View  Result Date: 11/05/2017 CLINICAL DATA:  Patient seen Dr Caryl Comes 2 days ago and went from zpak to doxycycline for bilateral pneumonia; having productive cough yellow sputum x 2 weeks, at times brown with sinus congestion AND pressure; patient reports discomfort to chest from coughing as well as headache ; history of aneurysm EXAM: CHEST  2 VIEW COMPARISON:  11/27/2016 FINDINGS: Cardiac enlargement. No vascular congestion or edema. There is diffuse interstitial pattern to the lungs with peribronchial thickening likely representing diffuse bronchitis. This could be either acute or  chronic. There is a focal irregular opacity over the right mid lung. This could represent focal pneumonia, mucous plugging, or a parenchymal nodule. This is new since previous study. Recommend follow-up after treatment of acute process. Calcified granulomas in the left lung. Calcified and tortuous aorta. Degenerative changes throughout the spine. IMPRESSION: Bronchitic changes in the lungs may represent acute or chronic bronchitis. Focal opacity in the right mid lung could represent focal pneumonia, mucous plugging, or parenchymal nodule. Followup PA and lateral chest X-ray is recommended in 3-4 weeks following trial of appropriate clinical therapy to ensure resolution and exclude underlying malignancy. Bronchiectasis. Aortic atherosclerosis. Electronically Signed   By: Lucienne Capers M.D.   On: 11/05/2017 20:46   Ct Head Wo Contrast  Result Date: 11/05/2017 CLINICAL DATA:  Acute confusion.  Headache. EXAM: CT HEAD WITHOUT CONTRAST TECHNIQUE: Contiguous axial images were obtained from the base of the skull through the vertex without intravenous contrast. 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 No acute hemorrhage or evidence of acute ischemia. Age related atrophy and  chronic small vessel ischemic change. Remote lacunar infarcts in the right cerebellum. No hydrocephalus, mass effect, or midline shift. No subdural or extra-axial fluid collection. Vascular: No hyperdense vessel. 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 Skullbase atherosclerosis. Skull: No fracture or focal lesion. Sinuses/Orbits: Mucosal thickening with fluid level on the left maxillary sinus. Opacification of scattered left ethmoid air cells. The mastoid air cells are clear. Other: None. IMPRESSION: 1.  No acute intracranial abnormality. 2. Remote infract in the right parietooccipital lobe. Age related atrophy and chronic small vessel ischemia. Electronically Signed   By: Jeb Levering M.D.   On: 11/05/2017 21:09   Ct Chest W Contrast  Result Date: 11/08/2017 CLINICAL DATA:  Cough. EXAM: CT CHEST WITH CONTRAST TECHNIQUE: Multidetector CT imaging of the chest was performed during intravenous contrast administration. CONTRAST:  17m ISOVUE-300 IOPAMIDOL (ISOVUE-300) INJECTION 61% COMPARISON:  None. FINDINGS: Cardiovascular: Heart size upper normal to mildly increased. Atherosclerotic calcification is noted in the wall of the thoracic aorta. Coronary artery calcification is evident. Enlargement of the main pulmonary arteries suggests pulmonary arterial hypertension. Mediastinum/Nodes: No mediastinal lymphadenopathy. There is no hilar lymphadenopathy. The esophagus has normal imaging features. Small hiatal hernia. There is no axillary lymphadenopathy. Lungs/Pleura: Subtle subpleural reticulation noted bilaterally with a basilar predominance. Circumferential bronchial wall thickening evident. There is patchy airspace disease in the posterior right upper lobe. Inferior lung bases not well evaluated given motion artifact, but probable component of small airway impaction suspected with patchy airspace opacity. Upper Abdomen: Unremarkable.  Musculoskeletal: Bone windows reveal no worrisome lytic or sclerotic osseous lesions. IMPRESSION: 1. Patchy airspace opacity posterior right upper lobe compatible with pneumonia. Follow-up imaging recommended to ensure resolution. 2. Bronchial wall thickening with probable small airway impaction and patchy airspace opacity in the posterior lung bases. Atypical infection could have this appearance. Aspiration could present similarly in the appropriate clinical setting. This could also be reassessed at the time of followup. 3. Enlargement of pulmonary arteries suggests pulmonary arterial hypertension. 4. Small hiatal hernia. 5.  Aortic Atherosclerois (ICD10-170.0) Electronically Signed   By: EMisty StanleyM.D.   On: 11/08/2017 19:46    Assessment:   MAMUNIQUE NEYRAis a 84y.o. female admitted with cough, sob and found to have PNA treated with ctx and azithro and clincially improving now with vesicular eruption  consistent with shingles.  Have sent VZV PCR but can take 5-7 days to return.   Recommendations Start valtrex 1000 BID - renal dosing- for 7 days Has been treated with 7 days of ctx and azithro and no fevers, leukocytosis and clinically improved. No further abx needed. Can dc to facility if they will accept. Lesions will take 7-14 days to crust over so no need to keep her here for that time.  Thank you very much for allowing me to participate in the care of this patient. Please call with questions.   Cheral Marker. Ola Spurr, MD

## 2017-11-11 NOTE — NC FL2 (Signed)
[place] LEVEL OF CARE SCREENING TOOL     IDENTIFICATION  Patient Name: Michele [name] Birthdate: 04-21-1924 Sex: female Admission Date (Current Location): 11/05/2017  [place] and [place] Number:  Engineering geologist and Address:  [facility], [address], [place], [place] [address]      Provider Number: [id number]  Attending Physician Name and Address:  Max Sane, MD  Relative Name and Phone Number:  [name] (Daughter/HCPOA) [phone]    Current Level of Care: Hospital Recommended Level of Care: [place] Prior Approval Number:    Date Approved/Denied:   PASRR Number: Pending  Discharge Plan: SNF    Current Diagnoses: Patient Active Problem List   Diagnosis Date Noted  . Pneumonia 11/06/2017    Orientation RESPIRATION BLADDER Height & Weight     Self, Time, Situation, Place  Normal Continent Weight: 130 lb 1.6 oz (59 kg) Height:  5\' 3"  (160 cm)  BEHAVIORAL SYMPTOMS/MOOD NEUROLOGICAL BOWEL NUTRITION STATUS      Continent Diet(Heart healthy)  AMBULATORY STATUS COMMUNICATION OF NEEDS Skin   Limited Assist Verbally Normal                       Personal Care Assistance Level of Assistance  Bathing, Feeding, Dressing Bathing Assistance: Independent Feeding assistance: Independent Dressing Assistance: Independent     Functional Limitations Info             SPECIAL CARE FACTORS FREQUENCY  PT (By licensed PT)     PT Frequency: 5/week              Contractures Contractures Info: Not present    Additional Factors Info  Code Status, Allergies, Psychotropic Code Status Info: Full Allergies Info: Penicillins Psychotropic Info: Xanax, Buspar, Lexapro         Current Medications (11/11/2017):  This is the current hospital active medication list Current Facility-Administered Medications  Medication Dose Route Frequency Provider Last Rate Last Dose  . acetaminophen (TYLENOL)  tablet 650 mg  650 mg Oral Q6H PRN Amelia Jo, MD       Or  . acetaminophen (TYLENOL) suppository 650 mg  650 mg Rectal Q6H PRN Amelia Jo, MD      . ALPRAZolam Duanne Moron) tablet 0.25 mg  0.25 mg Oral QHS PRN Amelia Jo, MD   0.25 mg at 11/08/17 0317  . azithromycin (ZITHROMAX) tablet 500 mg  500 mg Oral Daily Tom, [place], RPH      . benzonatate (TESSALON) capsule 200 mg  200 mg Oral TID Dustin Flock, MD   200 mg at 11/10/17 2144  . bisacodyl (DULCOLAX) EC tablet 5 mg  5 mg Oral Daily PRN Amelia Jo, MD   5 mg at 11/07/17 0931  . busPIRone (BUSPAR) tablet 5 mg  5 mg Oral BID Amelia Jo, MD   5 mg at 11/10/17 2144  . cefTRIAXone (ROCEPHIN) 1 g in dextrose 5 % 50 mL IVPB  1 g Intravenous Q24H Amelia Jo, MD   Stopped at 11/10/17 1006  . diltiazem (DILACOR XR) 24 hr capsule 120 mg  120 mg Oral Daily Amelia Jo, MD   120 mg at 11/10/17 0935  . docusate sodium (COLACE) capsule 100 mg  100 mg Oral BID Amelia Jo, MD   100 mg at 11/10/17 2144  . escitalopram (LEXAPRO) tablet 5 mg  5 mg Oral Daily Amelia Jo, MD   5 mg at 11/10/17 0935  . guaiFENesin (MUCINEX) 12  hr tablet 600 mg  600 mg Oral BID Dustin Flock, MD   600 mg at 11/10/17 2144  . guaiFENesin-codeine 100-10 MG/5ML solution 10 mL  10 mL Oral Q6H PRN Amelia Jo, MD   10 mL at 11/07/17 1337  . heparin injection 5,000 Units  5,000 Units Subcutaneous Q8H Amelia Jo, MD   5,000 Units at 11/11/17 0406  . HYDROcodone-acetaminophen (NORCO/VICODIN) 5-325 MG per tablet 1-2 tablet  1-2 tablet Oral Q4H PRN Amelia Jo, MD   1 tablet at 11/07/17 1337  . ipratropium-albuterol (DUONEB) 0.5-2.5 (3) MG/3ML nebulizer solution 3 mL  3 mL Nebulization Q4H PRN Dustin Flock, MD      . ipratropium-albuterol (DUONEB) 0.5-2.5 (3) MG/3ML nebulizer solution 3 mL  3 mL Nebulization BID Dustin Flock, MD   3 mL at 11/11/17 0759  . magnesium oxide (MAG-OX) tablet 400 mg  400 mg Oral BID Dustin Flock, MD   400 mg at 11/10/17  2144  . metoprolol succinate (TOPROL-XL) 24 hr tablet 25 mg  25 mg Oral Daily Amelia Jo, MD   25 mg at 11/10/17 0935  . ondansetron (ZOFRAN) tablet 4 mg  4 mg Oral Q6H PRN Amelia Jo, MD       Or  . ondansetron [facility]) injection 4 mg  4 mg Intravenous Q6H PRN Amelia Jo, MD      . ramipril (ALTACE) capsule 10 mg  10 mg Oral BID Amelia Jo, MD   10 mg at 11/10/17 2144  . traZODone (DESYREL) tablet 25 mg  25 mg Oral QHS PRN Amelia Jo, MD   25 mg at 11/10/17 2144     Discharge Medications: Please see discharge summary for a list of discharge medications.  Relevant Imaging Results:  Relevant Lab Results:   Additional Information SS# [id number]  Syla Devoss, Veronia Beets, [place]

## 2017-11-11 NOTE — Discharge Summary (Addendum)
[place] [facility] [place] NAME: Michele [name]    MR#:  [id number]  DATE OF BIRTH:  July 07, 1924  DATE OF ADMISSION:  11/05/2017   ADMITTING PHYSICIAN: Amelia Jo, MD  DATE OF DISCHARGE: 11/12/2017  PRIMARY CARE PHYSICIAN: Adin Hector, MD   ADMISSION DIAGNOSIS:  Delirium [R41.0] Elevated troponin [R74.8] Community acquired pneumonia of right middle lobe of lung ([place]) [J18.1] DISCHARGE DIAGNOSIS:  Active Problems:   Pneumonia  SECONDARY DIAGNOSIS:   Past Medical History:  Diagnosis Date  . MI (myocardial infarction) ([place])   . Stroke [facility])    HOSPITAL COURSE:  1.  Pneumonia: improving with Abx 2.  Acute metabolic encephalopathy: likely due to delirium due to underlying infection. Avoid narcotics. 3.  Elevated troponin due to demand ischemia. 4.  Hypokalemia: repleted and resolved 5.  Generalized anxiety: stable on meds 6.  Essential hypertension continue metoprolol and ramipril 7. Shingles: vesicular eruption on rt shoulder consistent with shingles. Have sent VZV PCR but can take 5-7 days to return.  - continue valtrex 1000 BID - renal dosing- for 7 days - per ID These Lesions will take 7-14 days to crust over so no need to keep her here for that time. DISCHARGE CONDITIONS:  stable CONSULTS OBTAINED:  Treatment Team:  Erby Pian, MD Leonel Ramsay, MD DRUG ALLERGIES:   Allergies  Allergen Reactions  . Penicillins Rash    Has patient had a PCN reaction causing immediate rash, facial/tongue/throat swelling, SOB or lightheadedness with hypotension: Yes Has patient had a PCN reaction causing severe rash involving mucus membranes or skin necrosis: No Has patient had a PCN reaction that required hospitalization: No Has patient had a PCN reaction occurring within the last 10 years: No If all of the above answers are "NO", then may proceed with Cephalosporin use.   DISCHARGE MEDICATIONS:   Allergies as of 11/12/2017       Reactions   Penicillins Rash   Has patient had a PCN reaction causing immediate rash, facial/tongue/throat swelling, SOB or lightheadedness with hypotension: Yes Has patient had a PCN reaction causing severe rash involving mucus membranes or skin necrosis: No Has patient had a PCN reaction that required hospitalization: No Has patient had a PCN reaction occurring within the last 10 years: No If all of the above answers are "NO", then may proceed with Cephalosporin use.      Medication List    STOP taking these medications   doxycycline 100 MG tablet Commonly known as:  VIBRA-TABS     TAKE these medications   ALPRAZolam 0.25 MG tablet Commonly known as:  XANAX Take 1 tablet (0.25 mg total) by mouth at bedtime as needed for anxiety.   benzonatate 200 MG capsule Commonly known as:  TESSALON Take 200 mg by mouth 3 (three) times daily as needed for cough.   busPIRone 5 MG tablet Commonly known as:  BUSPAR Take 5 mg by mouth 2 (two) times daily.   diltiazem 120 MG 24 hr capsule Commonly known as:  DILACOR XR Take 120 mg by mouth daily.   escitalopram 5 MG tablet Commonly known as:  LEXAPRO Take 5 mg by mouth daily.   metoprolol succinate 25 MG 24 hr tablet Commonly known as:  TOPROL-XL Take 25 mg by mouth daily.   ramipril 10 MG capsule Commonly known as:  ALTACE Take 10 mg by mouth 2 (two) times daily.   valACYclovir 1000 MG tablet Commonly known as:  VALTREX Take 1 tablet (1,000 mg total) by mouth 2 (two) times daily.        DISCHARGE INSTRUCTIONS:   DIET:  Regular diet DISCHARGE CONDITION:  Good ACTIVITY:  Activity as tolerated OXYGEN:  Home Oxygen: No.  Oxygen Delivery: room air DISCHARGE LOCATION:  nursing home   If you experience worsening of your admission symptoms, develop shortness of breath, life threatening emergency, suicidal or homicidal thoughts you must seek medical attention immediately by calling 911 or calling your MD immediately  if  symptoms less severe.  You Must read complete instructions/literature along with all the possible adverse reactions/side effects for all the Medicines you take and that have been prescribed to you. Take any new Medicines after you have completely understood and accpet all the possible adverse reactions/side effects.   Please note  You were cared for by a hospitalist during your hospital stay. If you have any questions about your discharge medications or the care you received while you were in the hospital after you are discharged, you can call the unit and asked to speak with the hospitalist on call if the hospitalist that took care of you is not available. Once you are discharged, your primary care physician will handle any further medical issues. Please note that NO REFILLS for any discharge medications will be authorized once you are discharged, as it is imperative that you return to your primary care physician (or establish a relationship with a primary care physician if you do not have one) for your aftercare needs so that they can reassess your need for medications and monitor your lab values.    On the day of Discharge:  VITAL SIGNS:  Blood pressure (!) 154/78, pulse 63, temperature 97.6 F (36.4 C), temperature source Oral, resp. rate 15, height 5\' 3"  (1.6 m), weight 49 kg (108 lb), SpO2 95 %. PHYSICAL EXAMINATION:  GENERAL:  84 y.o.-year-old patient lying in the bed with no acute distress.  EYES: Pupils equal, round, reactive to light and accommodation. No scleral icterus. Extraocular muscles intact.  HEENT: Head atraumatic, normocephalic. Oropharynx and nasopharynx clear.  NECK:  Supple, no jugular venous distention. No thyroid enlargement, no tenderness.  LUNGS: Normal breath sounds bilaterally, no wheezing, rales,rhonchi or crepitation. No use of accessory muscles of respiration.  CARDIOVASCULAR: S1, S2 normal. No murmurs, rubs, or gallops.  ABDOMEN: Soft, non-tender, non-distended.  Bowel sounds present. No organomegaly or mass.  EXTREMITIES: No pedal edema, cyanosis, or clubbing.  NEUROLOGIC: Cranial nerves II through XII are intact. Muscle strength 5/5 in all extremities. Sensation intact. Gait not checked.  PSYCHIATRIC: The patient is alert and oriented x 3.  SKIN: R shoulder with clusters of vesicles DATA REVIEW:   CBC Recent Labs  Lab 11/12/17 0644  WBC 5.5  HGB 12.6  HCT 37.3  PLT 364    Chemistries  Recent Labs  Lab 11/07/17 0547 11/09/17 0429 11/12/17 0644  NA 140 142  --   K 3.4* 3.6  --   CL 108 108  --   CO2 25 25  --   GLUCOSE 119* 99  --   BUN <5* 8  --   CREATININE 0.61 0.65 0.70  CALCIUM 9.1 9.1  --   MG 1.6*  --   --      Contact information for follow-up providers    Tama High III, MD. Schedule an appointment as soon as possible for a visit in 1 week(s).   Specialty:  Internal Medicine Contact information: [phone]  [place] [address] [phone]        Leonel Ramsay, MD. Schedule an appointment as soon as possible for a visit in 2 week(s).   Specialty:  Infectious Diseases Contact information: [place] [address] [phone]            Contact information for after-discharge care    Destination    [facility] Follow up.   Service:  Skilled Nursing Contact information: [address] [place] [phone]                  Management plans discussed with the patient, family and they are in agreement.  CODE STATUS: Full Code   TOTAL TIME TAKING CARE OF THIS PATIENT: 45 minutes.    Max Sane M.D on 11/12/2017 at 8:09 AM  Between 7am to 6pm - Pager - [phone]  After 6pm go to [email or web address] - password [email or web address] [facility]  [facility]  Office  [phone]  CC: Primary care physician; Adin Hector, MD   Note: This dictation was prepared with Dragon dictation  along with smaller phrase technology. Any transcriptional errors that result from this process are unintentional.

## 2017-11-11 NOTE — Progress Notes (Signed)
Per [facility] liaison they can accept patient if the shingles remain on one side of her body. Per nurse the shingles are on one side. Patient can D/C to [facility] tomorrow if there are no changes with her shingles.  [employer], LCSW [phone]

## 2017-11-12 LAB — CBC
HEMATOCRIT: 37.3 % (ref 35.0–47.0)
HEMOGLOBIN: 12.6 g/dL (ref 12.0–16.0)
MCH: 29.9 pg (ref 26.0–34.0)
MCHC: 33.8 g/dL (ref 32.0–36.0)
MCV: 88.5 fL (ref 80.0–100.0)
Platelets: 364 10*3/uL (ref 150–440)
RBC: 4.22 MIL/uL (ref 3.80–5.20)
RDW: 14 % (ref 11.5–14.5)
WBC: 5.5 10*3/uL (ref 3.6–11.0)

## 2017-11-12 LAB — CREATININE, SERUM
Creatinine, Ser: 0.7 mg/dL (ref 0.44–1.00)
GFR calc Af Amer: >60 mL/min (ref >60)

## 2017-11-12 MED ORDER — VALACYCLOVIR HCL 1 G PO TABS: 1000.0000 mg | ORAL_TABLET | Freq: Two times a day (BID) | ORAL | 0 refills | Status: DC

## 2017-11-12 NOTE — Clinical Social Work Note (Signed)
Patient to be d/c'ed today to [facility].  Patient and family agreeable to plans will transport via family car RN to call report to room [phone] hall nurse [phone].  CSW updated patient's daughter Edd Fabian via phone.  Evette Cristal, MSW, [place]

## 2017-11-12 NOTE — Progress Notes (Signed)
Report called to [facility] at 12:48 pm @ [phone]. Report given to Ms. Yaakov Guthrie. RN unavalible. All questions adresses

## 2017-11-12 NOTE — Progress Notes (Signed)
Pt. Discharged under care of daughter and caregiver for transport to [facility]. Discharge instructions covered questions addressed. No further questions at time.

## 2017-11-13 LAB — HERPES SIMPLEX VIRUS(HSV) DNA BY PCR
HSV 1 DNA: NEGATIVE
HSV 2 DNA: NEGATIVE

## 2017-11-14 LAB — VARICELLA-ZOSTER BY PCR: VARICELLA-ZOSTER, PCR: POSITIVE — AB

## 2017-12-29 DIAGNOSIS — R001 Bradycardia, unspecified: Secondary | ICD-10-CM | POA: Insufficient documentation

## 2018-03-23 ENCOUNTER — Ambulatory Visit
Admission: RE | Admit: 2018-03-23 | Discharge: 2018-03-23 | Disposition: A | Discharge status: Home / Self Care | Payer: Medicare Other | Source: Ambulatory Visit | Attending: Internal Medicine | Admitting: Internal Medicine

## 2018-03-23 ENCOUNTER — Other Ambulatory Visit: Payer: Self-pay | Admitting: Internal Medicine

## 2018-03-23 DIAGNOSIS — R600 Localized edema: Secondary | ICD-10-CM

## 2018-03-23 IMAGING — US US EXTREM LOW VENOUS*R*
1 series · 13 of 24 positions shown · non-contrast
Comparison: None.

CLINICAL DATA: [AGE] with right lower extremity edema.



[Series 1: us extrem low venous*right* · 13 of 42 slices shown]
[im 1/42]
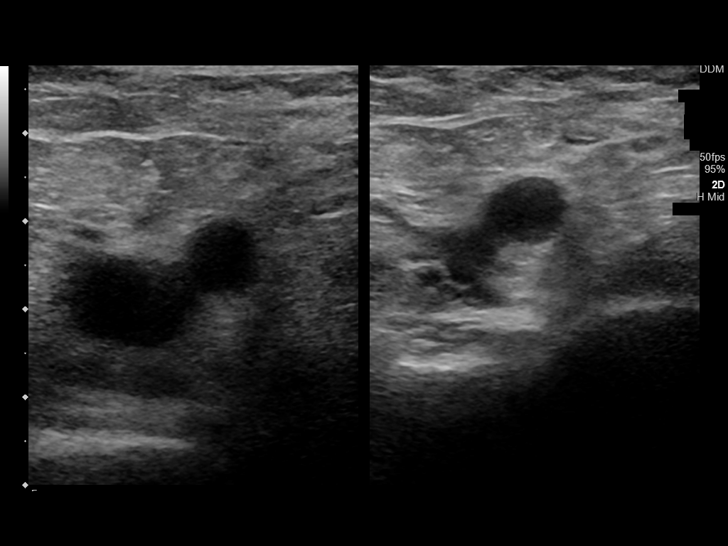
[im 4/42]
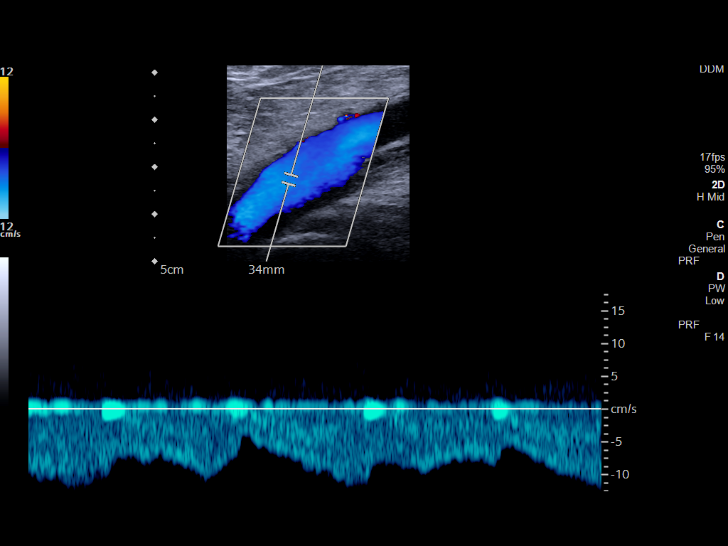
[im 8/42]
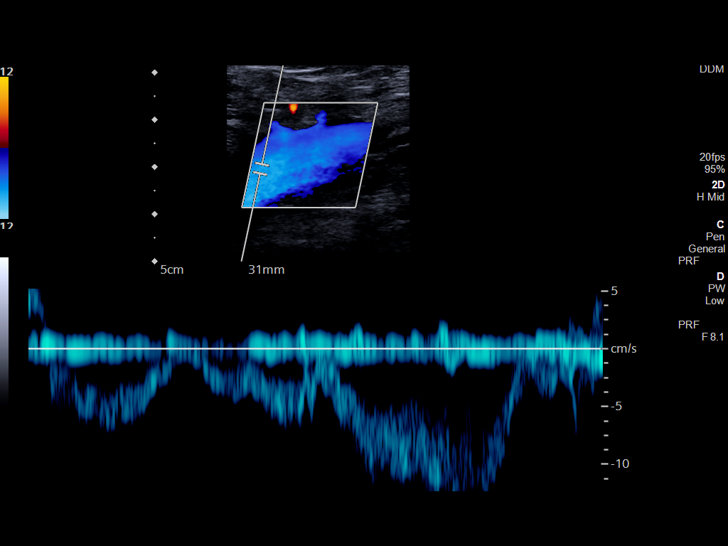
[im 11/42]
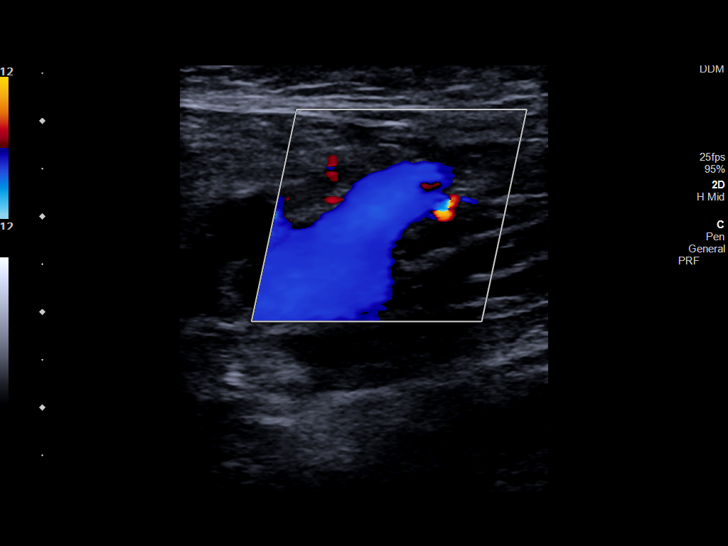
[im 15/42]
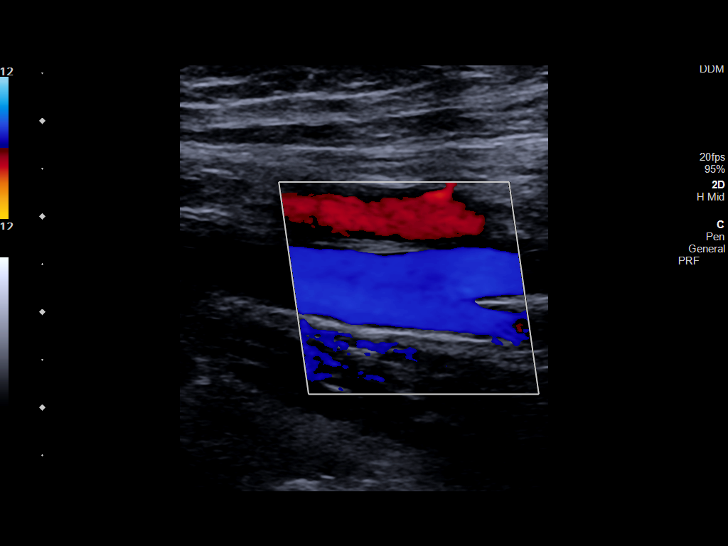
[im 18/42]
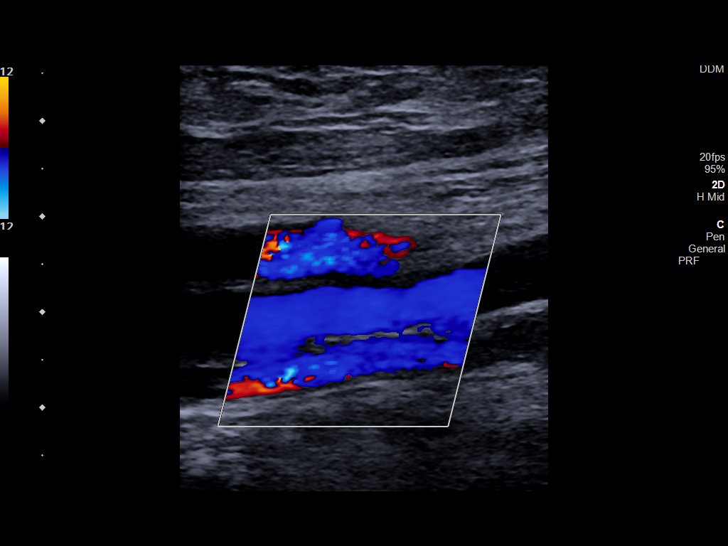
[im 22/42]
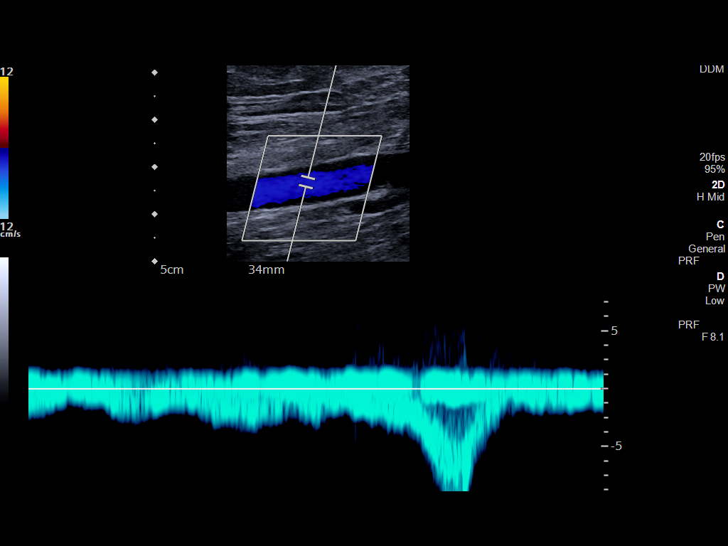
[im 24/42]
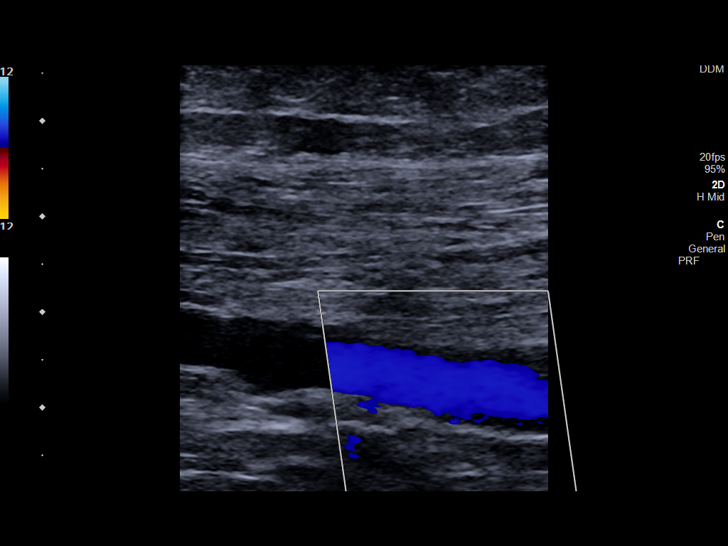
[im 27/42]
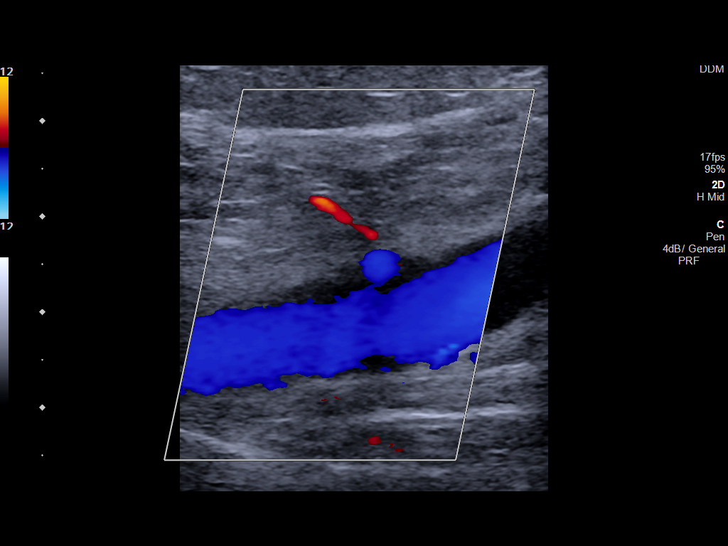
[im 31/42]
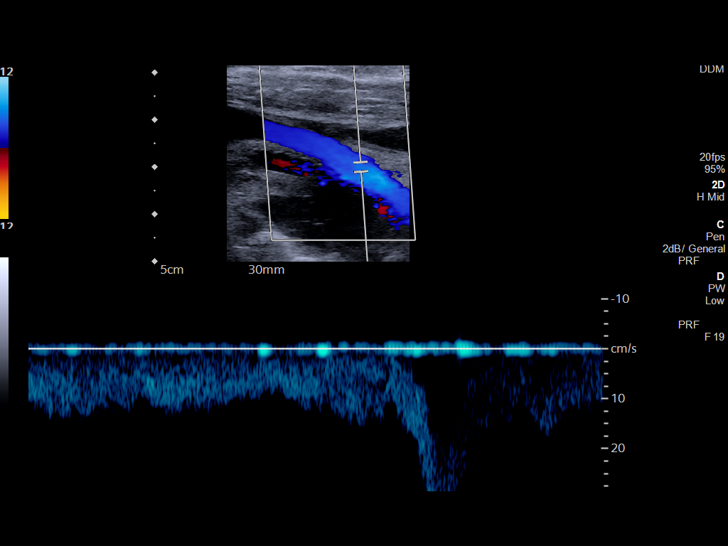
[im 34/42]
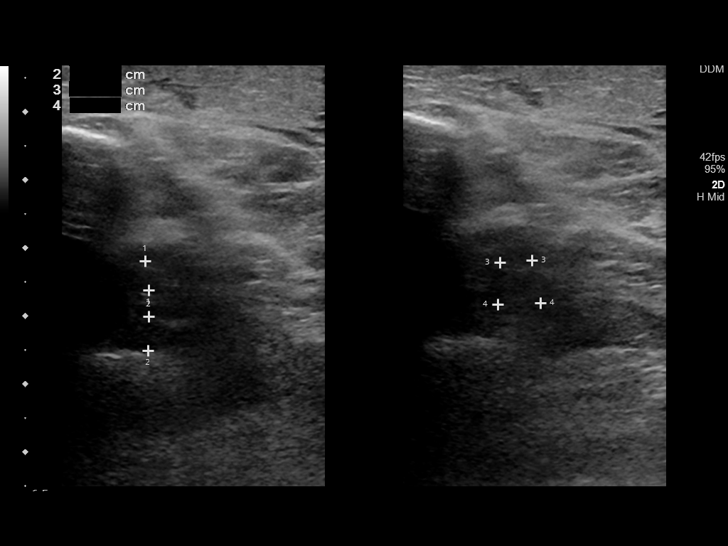
[im 38/42]
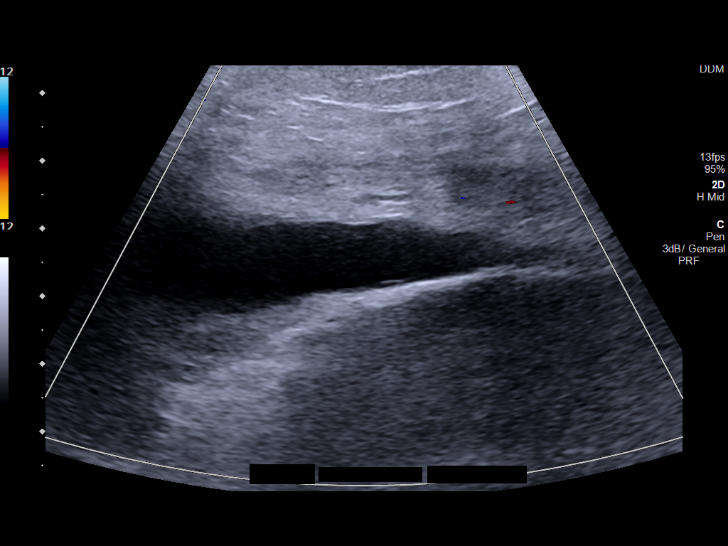
[im 42/42]
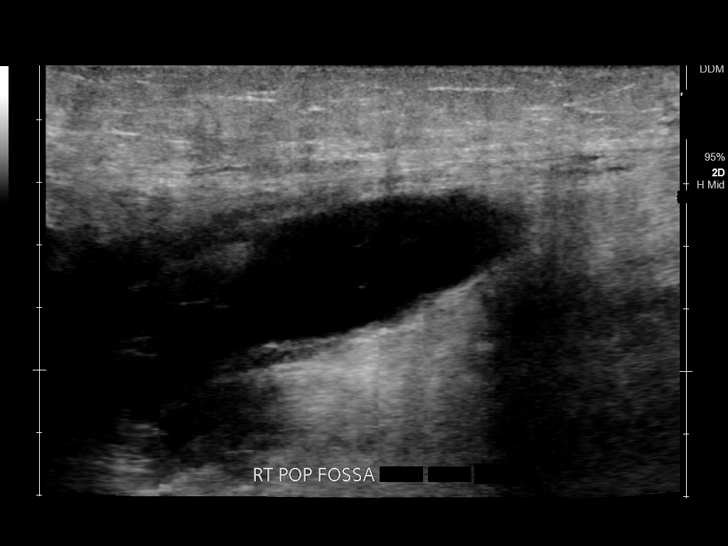

[13 of 24 positions shown; findings below may reference images not displayed]

FINDINGS: Contralateral Common Femoral Vein: Respiratory phasicity is normal
and symmetric with the symptomatic side. No evidence of thrombus.
Normal compressibility.

Common Femoral Vein: No evidence of thrombus. Normal
compressibility, respiratory phasicity and response to augmentation.

Saphenofemoral Junction: No evidence of thrombus. Normal
compressibility and flow on color Doppler imaging.

Profunda Femoral Vein: No evidence of thrombus. Normal
compressibility and flow on color Doppler imaging.

Femoral Vein: No evidence of thrombus. Normal compressibility,
respiratory phasicity and response to augmentation.

Popliteal Vein: No evidence of thrombus. Normal compressibility,
respiratory phasicity and response to augmentation.

Calf Veins: No evidence of thrombus. Normal compressibility and flow
on color Doppler imaging.

Other Findings: Complex heterogeneous structure in the right
popliteal fossa that extends into the right calf. This structure
measures 10.3 x 3.2 x 5.2 cm. This is most compatible with a complex
fluid collection with septations.
IMPRESSION: Negative for deep venous thrombosis in right lower extremity.

Complex fluid collection in the right popliteal fossa that extends
into the right calf. Findings are compatible with a large complex
Baker's cyst. Cannot exclude rupture or partial rupture of the cyst.

## 2018-04-22 ENCOUNTER — Other Ambulatory Visit: Payer: Self-pay | Admitting: Sports Medicine

## 2018-05-09 ENCOUNTER — Emergency Department: Payer: Medicare Other

## 2018-05-09 ENCOUNTER — Encounter: Payer: Self-pay | Admitting: Emergency Medicine

## 2018-05-09 ENCOUNTER — Emergency Department
Admission: EM | Admit: 2018-05-09 | Discharge: 2018-05-09 | Disposition: A | Discharge status: Home / Self Care | Payer: Medicare Other | Attending: Emergency Medicine | Admitting: Emergency Medicine

## 2018-05-09 DIAGNOSIS — Z7982 Long term (current) use of aspirin: Secondary | ICD-10-CM | POA: Diagnosis not present

## 2018-05-09 DIAGNOSIS — Y999 Unspecified external cause status: Secondary | ICD-10-CM | POA: Insufficient documentation

## 2018-05-09 DIAGNOSIS — Z8673 Personal history of transient ischemic attack (TIA), and cerebral infarction without residual deficits: Secondary | ICD-10-CM | POA: Diagnosis not present

## 2018-05-09 DIAGNOSIS — W1830XA Fall on same level, unspecified, initial encounter: Secondary | ICD-10-CM | POA: Insufficient documentation

## 2018-05-09 DIAGNOSIS — I252 Old myocardial infarction: Secondary | ICD-10-CM | POA: Insufficient documentation

## 2018-05-09 DIAGNOSIS — Y92129 Unspecified place in nursing home as the place of occurrence of the external cause: Secondary | ICD-10-CM | POA: Insufficient documentation

## 2018-05-09 DIAGNOSIS — W19XXXA Unspecified fall, initial encounter: Secondary | ICD-10-CM

## 2018-05-09 DIAGNOSIS — S20219A Contusion of unspecified front wall of thorax, initial encounter: Secondary | ICD-10-CM | POA: Insufficient documentation

## 2018-05-09 DIAGNOSIS — S8991XA Unspecified injury of right lower leg, initial encounter: Secondary | ICD-10-CM | POA: Diagnosis present

## 2018-05-09 DIAGNOSIS — S8001XA Contusion of right knee, initial encounter: Secondary | ICD-10-CM | POA: Insufficient documentation

## 2018-05-09 DIAGNOSIS — S20219D Contusion of unspecified front wall of thorax, subsequent encounter: Secondary | ICD-10-CM

## 2018-05-09 DIAGNOSIS — R0781 Pleurodynia: Secondary | ICD-10-CM

## 2018-05-09 DIAGNOSIS — Z79899 Other long term (current) drug therapy: Secondary | ICD-10-CM | POA: Insufficient documentation

## 2018-05-09 DIAGNOSIS — Y939 Activity, unspecified: Secondary | ICD-10-CM | POA: Insufficient documentation

## 2018-05-09 DIAGNOSIS — S8001XD Contusion of right knee, subsequent encounter: Secondary | ICD-10-CM

## 2018-05-09 IMAGING — CR DG KNEE 1-2V*R*
1 series · 2 of 2 positions shown · non-contrast
Comparison: None.

CLINICAL DATA: Right knee pain after fall

EXAM:
RIGHT KNEE - 1-2 VIEW

[Series 1: x knee ap right · 0.14mm/px · 2 of 2 slices shown]
[im 1/2]
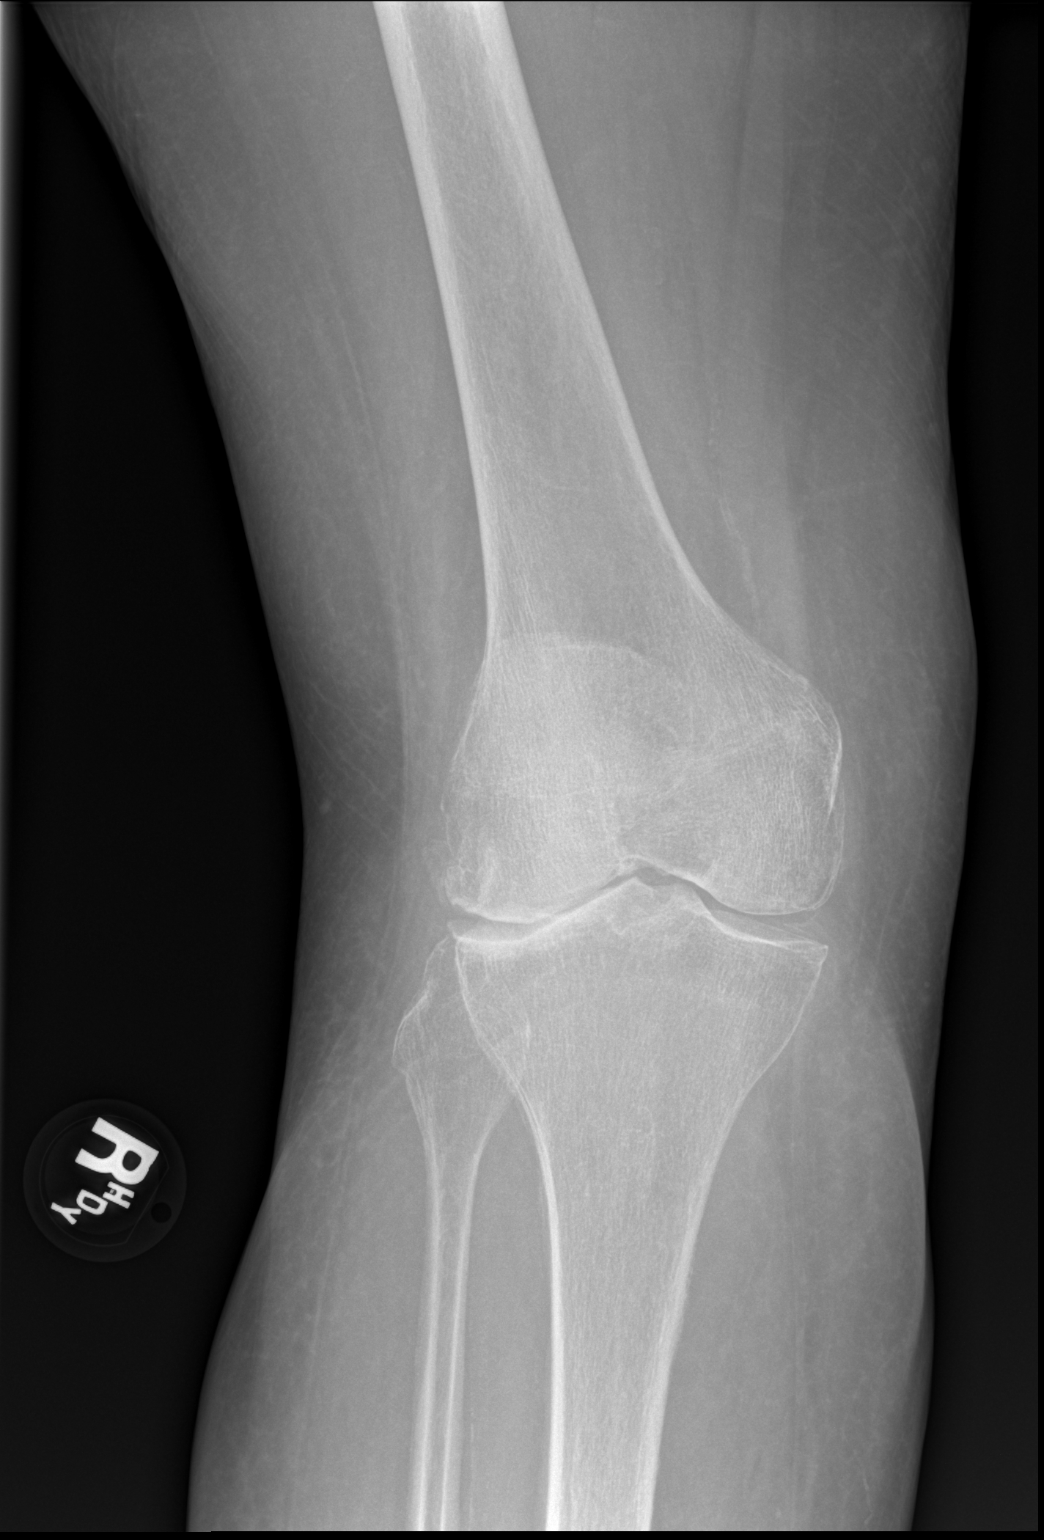
[im 2/2]
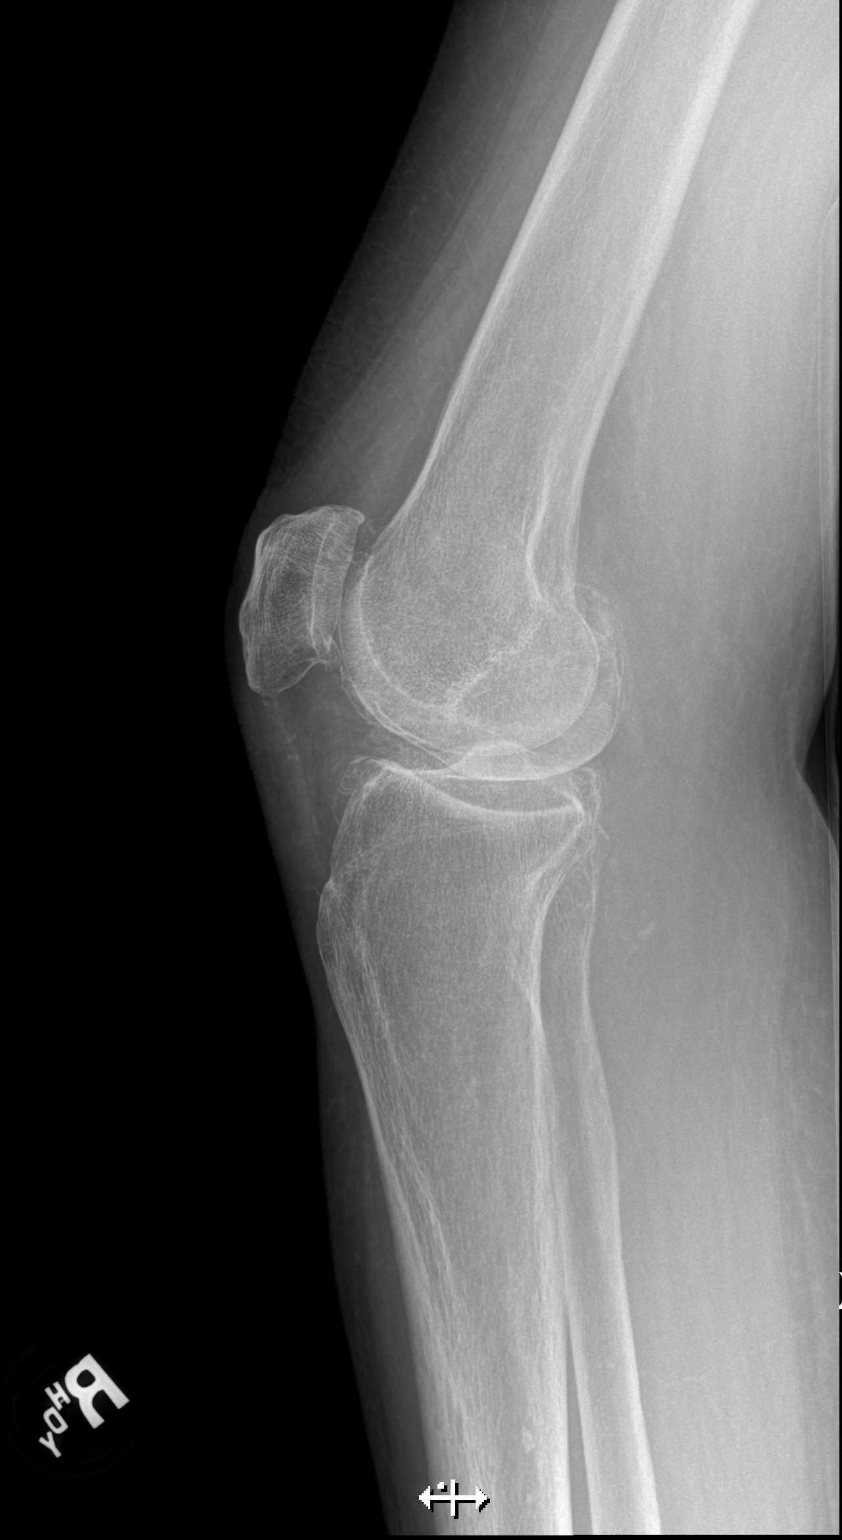

[2 of 2 positions shown; findings below may reference images not displayed]

FINDINGS: No fracture. Trace suprapatellar right knee joint effusion. No
dislocation. No suspicious focal osseous lesion. Moderate
tricompartmental right knee osteoarthritis, most prominent in the
lateral compartment. No radiopaque foreign body.
IMPRESSION: No right knee fracture or dislocation. Trace suprapatellar right
knee joint effusion. Moderate tricompartmental right knee
osteoarthritis, most prominent in the lateral compartment.

## 2018-05-09 IMAGING — CR DG RIBS W/ CHEST 3+V*R*
1 series · 2 of 2 positions shown · non-contrast
Comparison: Chest x-ray dated [DATE].

CLINICAL DATA: Fell weeks ago, increasing rib pain.

EXAM:
RIGHT RIBS AND CHEST - 3+ VIEW; LEFT RIBS - 2 VIEW

[Series 1: w ribs ap upper right · 0.14mm/px · 2 of 2 slices shown]
[im 1/2]
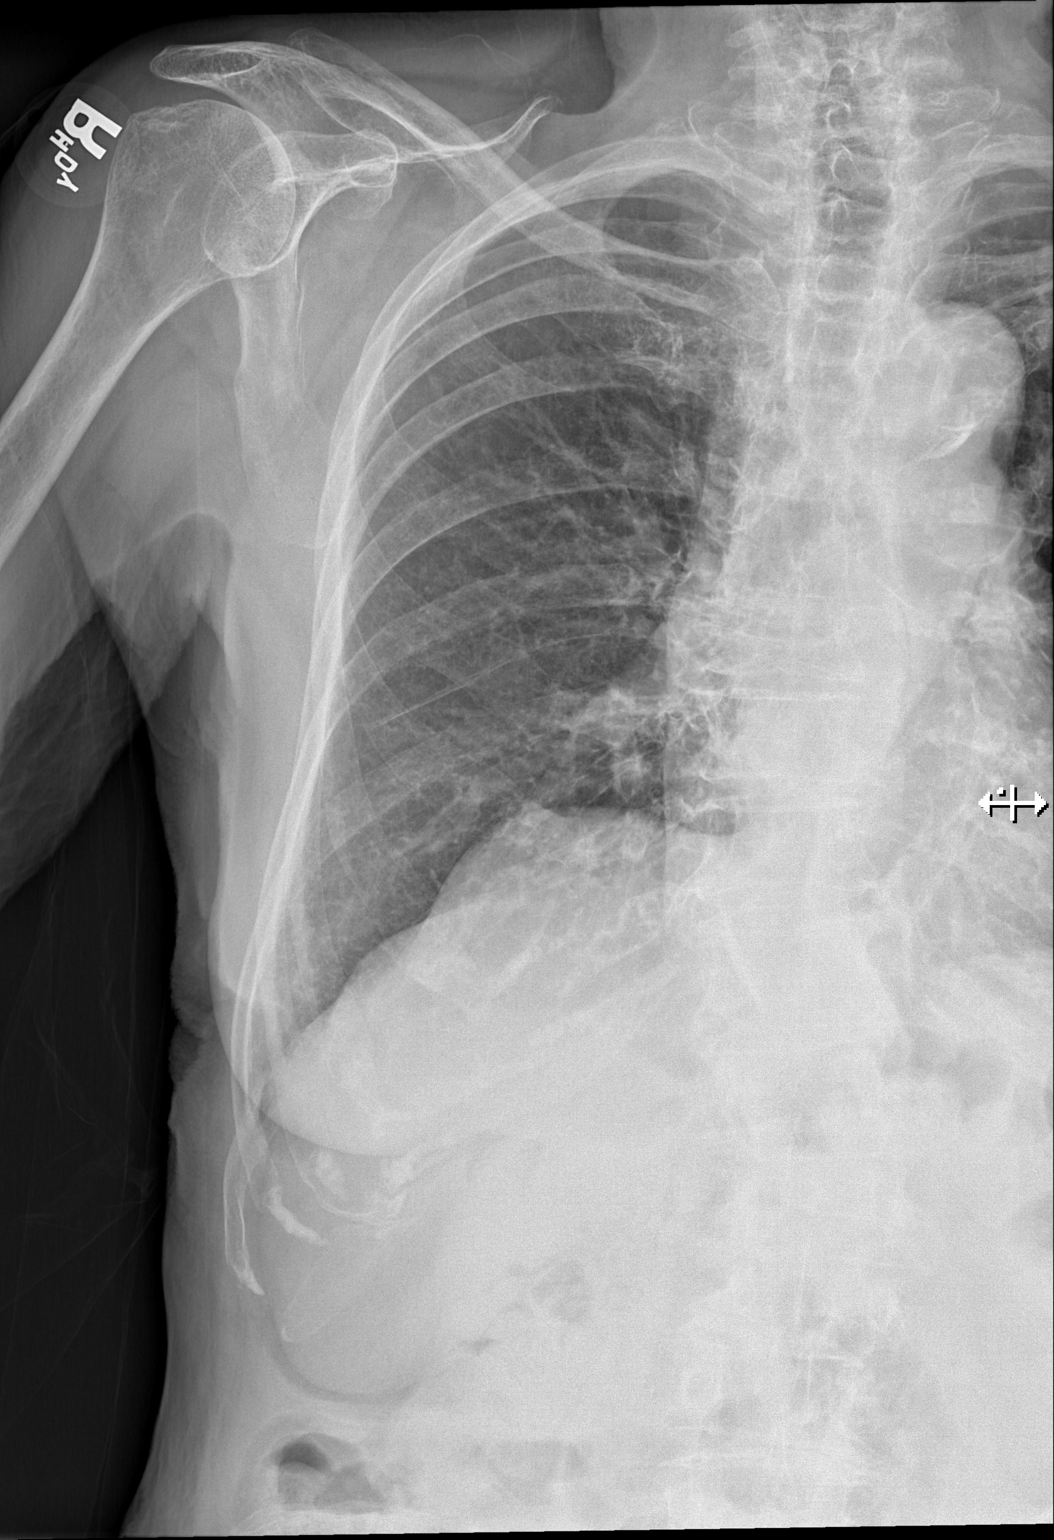
[im 2/2]
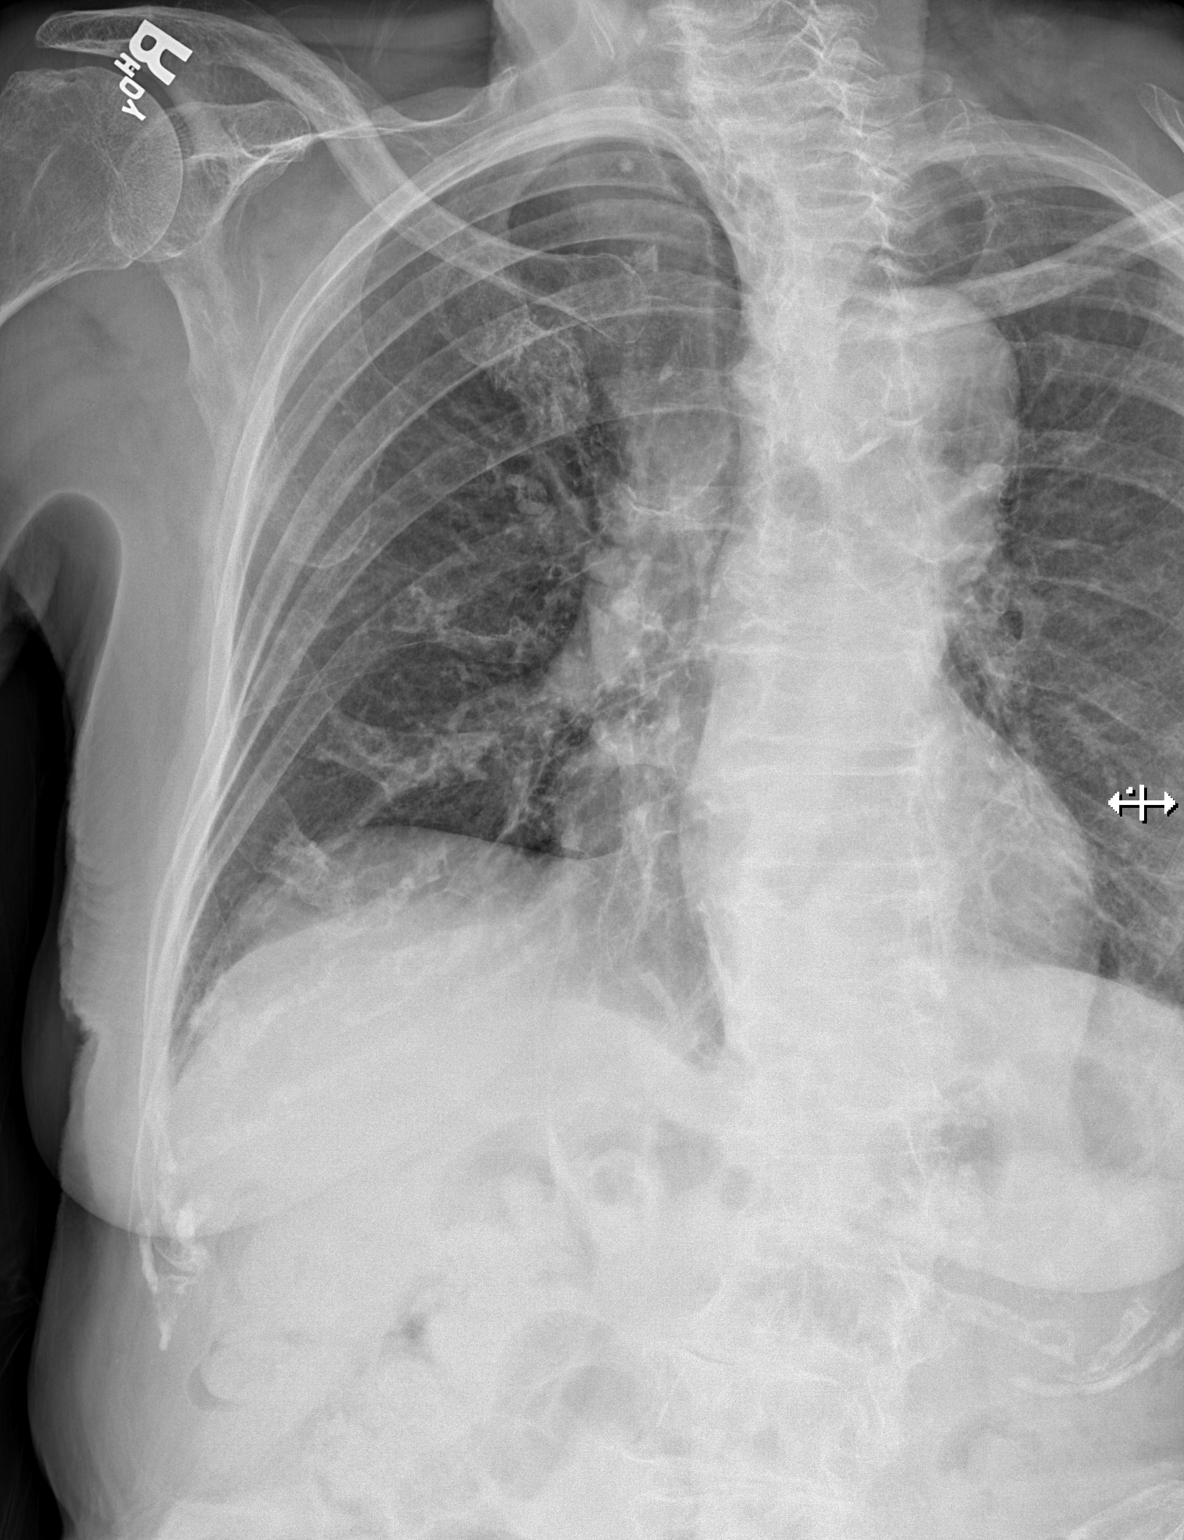

[2 of 2 positions shown; findings below may reference images not displayed]

FINDINGS: Single-view of the chest, two views of the LEFT ribs and two views
of the RIGHT ribs are provided.

Borderline cardiomegaly is stable. Overall cardiomediastinal
silhouette is stable. Atherosclerotic changes noted at the aortic
arch. Lungs are clear. No pleural effusion or pneumothorax seen.

Osseous structures about the chest are unremarkable. Stable
scoliosis of the thoracolumbar spine. No rib fracture or
displacement seen bilaterally.
IMPRESSION: 1. No acute findings. No rib fracture or displacement seen
bilaterally.
2. Stable borderline cardiomegaly.
3. Aortic atherosclerosis.

## 2018-05-09 IMAGING — CR DG RIBS 2V*L*
1 series · 3 of 3 positions shown · non-contrast
Comparison: Chest x-ray dated [DATE].

CLINICAL DATA: Fell weeks ago, increasing rib pain.

EXAM:
RIGHT RIBS AND CHEST - 3+ VIEW; LEFT RIBS - 2 VIEW

[Series 1: w chest pa · 0.14mm/px · 3 of 3 slices shown]
[im 1/3]
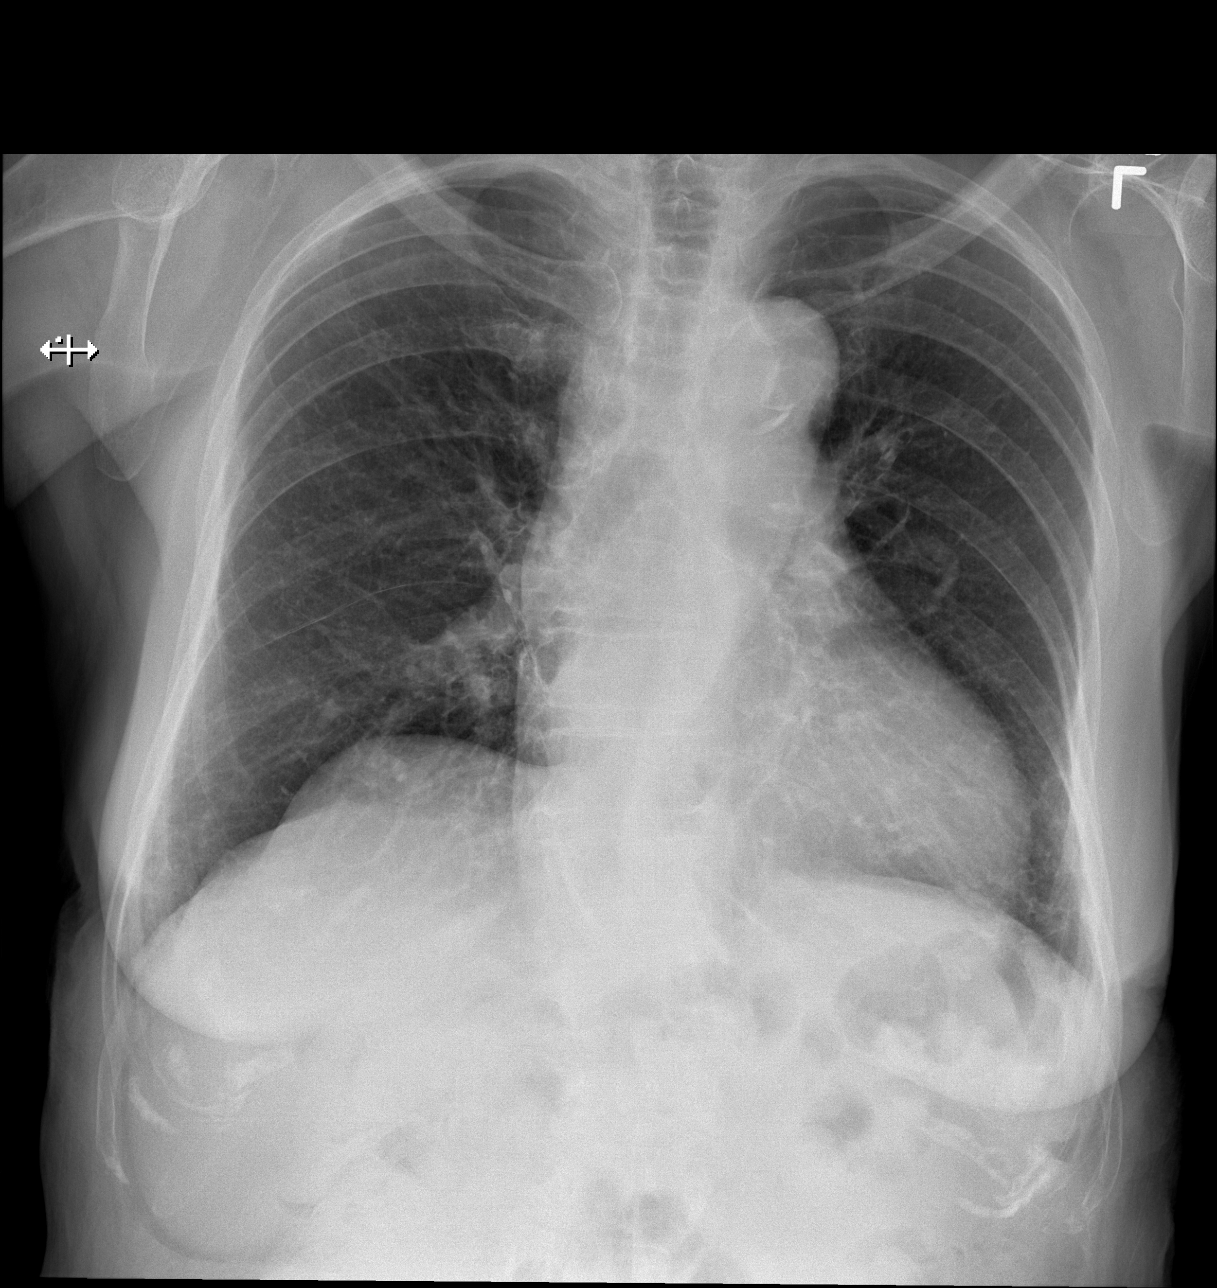
[im 2/3]
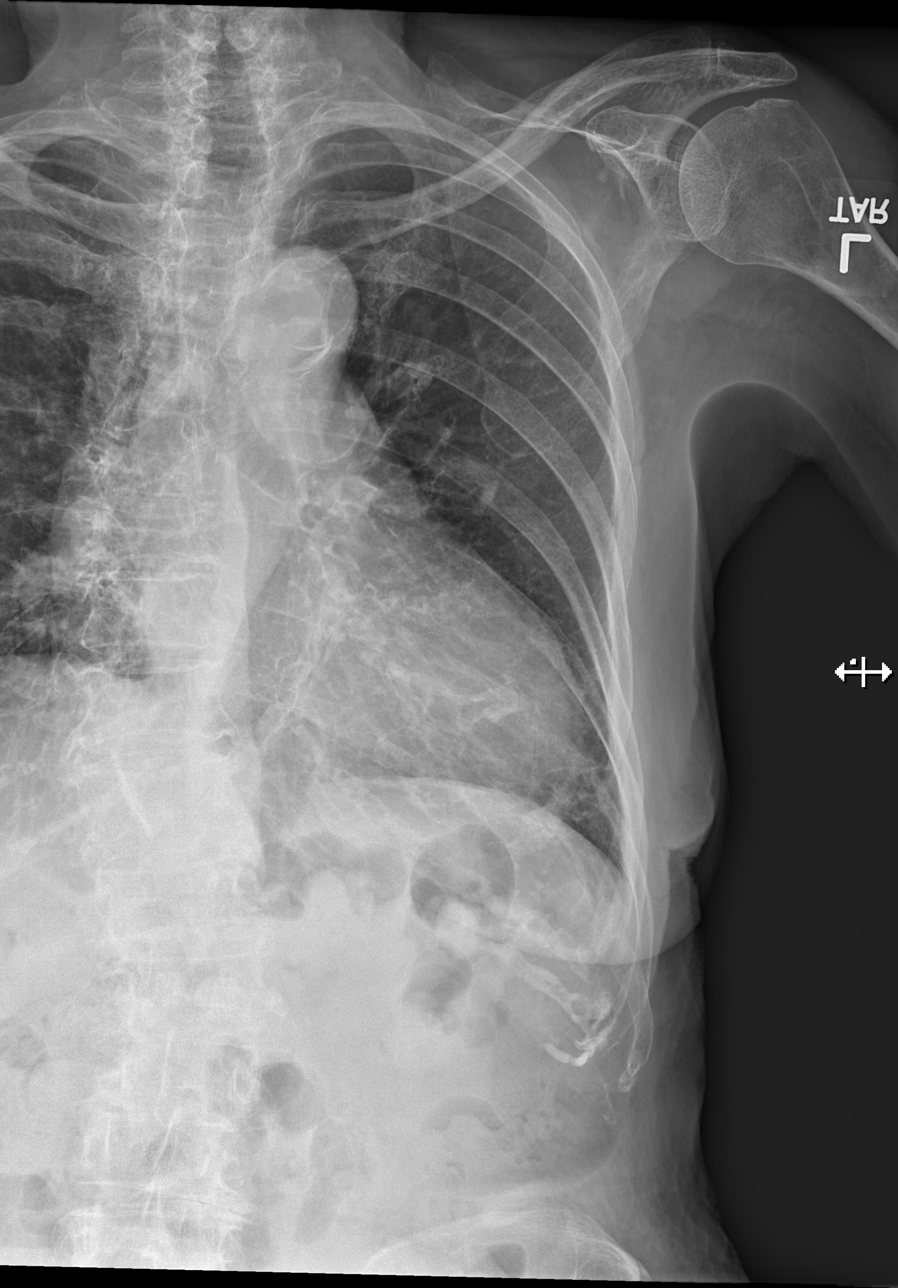
[im 3/3]
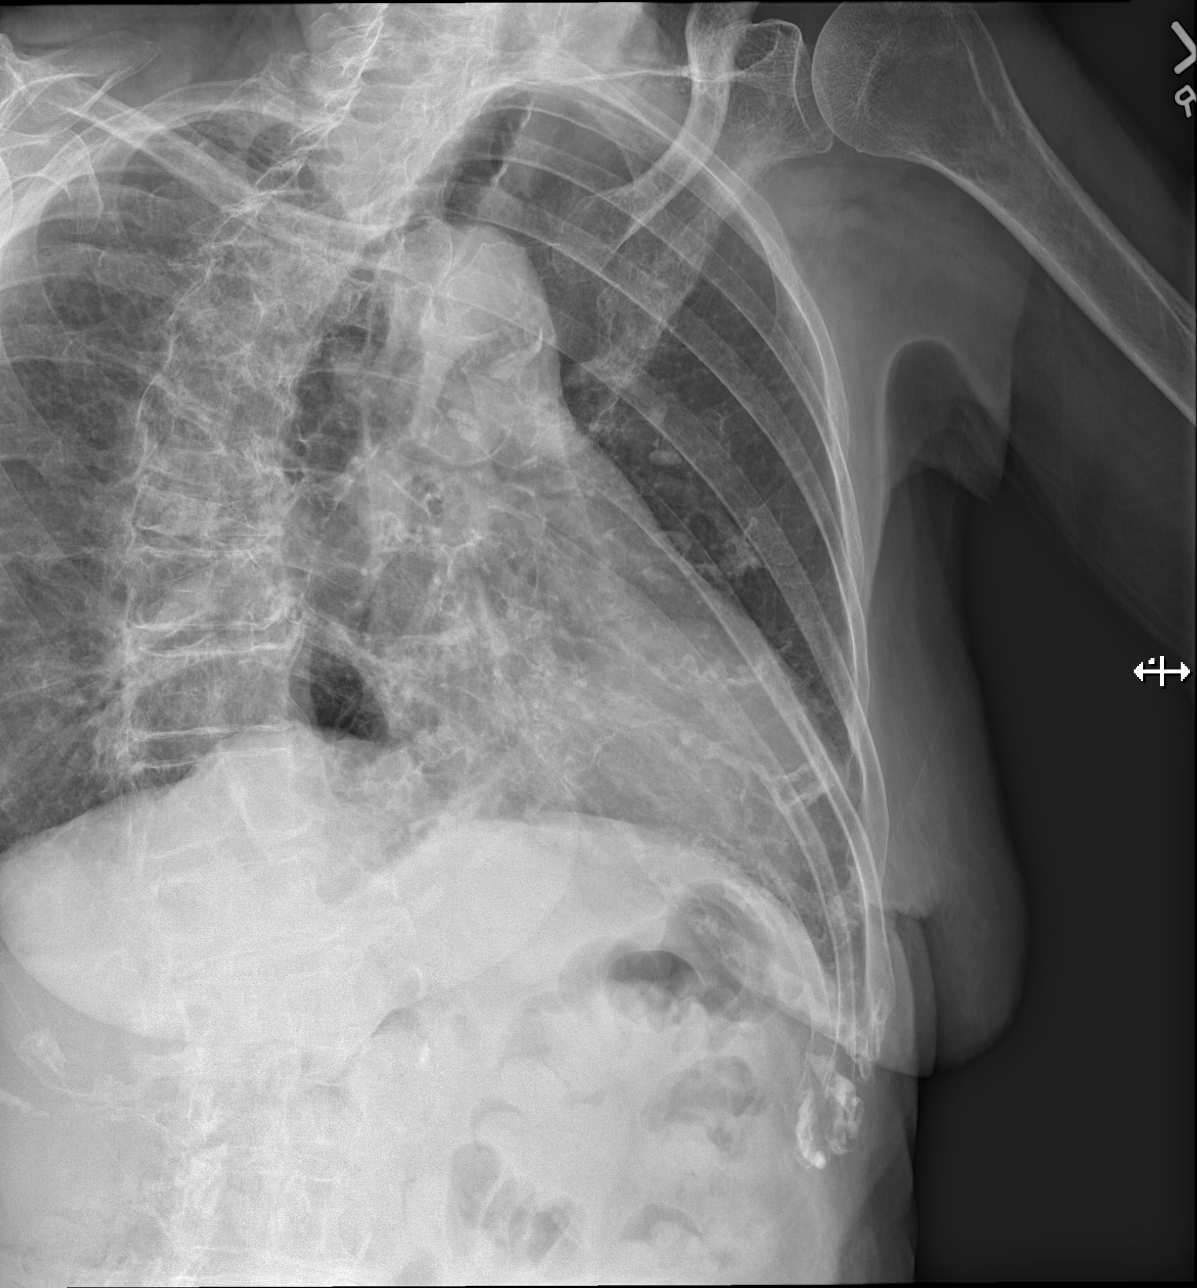

[3 of 3 positions shown; findings below may reference images not displayed]

FINDINGS: Single-view of the chest, two views of the LEFT ribs and two views
of the RIGHT ribs are provided.

Borderline cardiomegaly is stable. Overall cardiomediastinal
silhouette is stable. Atherosclerotic changes noted at the aortic
arch. Lungs are clear. No pleural effusion or pneumothorax seen.

Osseous structures about the chest are unremarkable. Stable
scoliosis of the thoracolumbar spine. No rib fracture or
displacement seen bilaterally.
IMPRESSION: 1. No acute findings. No rib fracture or displacement seen
bilaterally.
2. Stable borderline cardiomegaly.
3. Aortic atherosclerosis.

## 2018-05-09 NOTE — ED Triage Notes (Signed)
PT arrived from [facility]. Pt fell "a few weeks ago" and was evaluated by PCP for possible broken ribs. Pt was prescribed pain medication (hydrocodone-acetaminophen). Pt states for the last few days she has had increased pain in right rib cage and right knee. No obvious deformity. No shortening or rotation of leg. Pt's right leg/foot is red and swollen in comparison to the left. Pt states she took her medicine this morning but has had only minimal relief. Pt reports decreased ability to walk independently "lately."

## 2018-05-09 NOTE — ED Provider Notes (Signed)
[facility] Emergency Department Provider Note ____________________________________________   First MD Initiated Contact with Patient 05/09/18 1107     (approximate)  I have reviewed the triage vital signs and the nursing notes.   HISTORY  Chief Complaint Leg Pain    HPI Michele [name] is a 84 y.o. female with PMH as noted below who presents with bilateral rib and right knee pain after a fall several weeks ago.  Patient states that she had a mechanical fall from standing height.  She was evaluated by her PCP at that time but no imaging was required and she was started on pain medication.  Patient reports persistent pain to bilateral lower ribs both in the front and in the back, as well as to the right knee.  She denies other acute pain.  She denies shortness of breath.   Past Medical History:  Diagnosis Date  . MI (myocardial infarction) ([place])   . Stroke [facility])     Patient Active Problem List   Diagnosis Date Noted  . Pneumonia 11/06/2017    Past Surgical History:  Procedure Laterality Date  . ABDOMINAL HYSTERECTOMY    . APPENDECTOMY    . ROTATOR CUFF REPAIR Left     Prior to Admission medications   Medication Sig Start Date End Date Taking? Authorizing Provider  ALPRAZolam (XANAX) 0.25 MG tablet Take 1 tablet (0.25 mg total) by mouth at bedtime as needed for anxiety. 11/11/17  Yes Max Sane, MD  aspirin 325 MG tablet Take 325 mg by mouth daily.   Yes [provider]  azelastine (ASTELIN) 0.1 % nasal spray Place 1 spray into both nostrils 2 (two) times daily.   Yes [provider]  busPIRone (BUSPAR) 5 MG tablet Take 5 mg by mouth 2 (two) times daily.   Yes [provider]  diclofenac sodium (VOLTAREN) 1 % GEL Apply 4 g topically 4 (four) times daily as needed (pain).   Yes [provider]  escitalopram (LEXAPRO) 5 MG tablet Take 5 mg by mouth daily.   Yes [provider]  furosemide (LASIX) 20 MG tablet  Take 20 mg by mouth every other day.   Yes [provider]  HYDROcodone-acetaminophen (NORCO/VICODIN) 5-325 MG tablet Take  to 1 tablet by mouth 3 times daily as needed for severe pain   Yes [provider]  metoprolol succinate (TOPROL-XL) 25 MG 24 hr tablet Take 25 mg by mouth daily.   Yes [provider]  potassium chloride (K-DUR,KLOR-CON) 10 MEQ tablet Take 10 mEq by mouth daily.   Yes [provider]  ramipril (ALTACE) 10 MG capsule Take 10 mg by mouth 2 (two) times daily.   Yes [provider]  valACYclovir (VALTREX) 1000 MG tablet Take 1 tablet (1,000 mg total) by mouth 2 (two) times daily. Patient not taking: Reported on 05/09/2018 11/12/17   Max Sane, MD    Allergies Penicillins  No family history on file.  Social History Social History   Tobacco Use  . Smoking status: Never Smoker  . Smokeless tobacco: Never Used  Substance Use Topics  . Alcohol use: No    Frequency: Never  . Drug use: Not on file    Review of Systems  Constitutional: No fever. Eyes: No redness. ENT: No neck pain. Cardiovascular: Denies chest pain. Respiratory: Denies shortness of breath. Gastrointestinal: No vomiting. Genitourinary: Negative for flank pain.  Musculoskeletal: Positive for back pain. Skin: Negative for abrasions or lacerations. Neurological: Negative for headache.  ____________________________________________   PHYSICAL EXAM:  VITAL SIGNS: ED Triage Vitals  Enc Vitals Group     BP 05/09/18 1042 (!) 164/85     Pulse --      Resp 05/09/18 1042 18     Temp 05/09/18 1042 97.9 F (36.6 C)     Temp Source 05/09/18 1042 Oral     SpO2 05/09/18 1042 100 %     Weight 05/09/18 1043 128 lb (58.1 kg)     Height 05/09/18 1043 5\' 2"  (1.575 m)     Head Circumference --      [facility] Flow --      Pain Score 05/09/18 1043 8     Pain Loc --      Pain Edu? --      Excl. in [place]? --     Constitutional: Alert and oriented.  Relatively well  appearing and in no acute distress. Eyes: Conjunctivae are normal.  EOMI. Head: Atraumatic. Nose: No congestion/rhinnorhea. Mouth/Throat: Mucous membranes are moist.   Neck: Normal range of motion.  No midline cervical spinal tenderness. Cardiovascular: Good peripheral circulation. Respiratory: Normal respiratory effort.  No retractions. Gastrointestinal: Soft and nontender. No distention.  Genitourinary: No flank tenderness. Musculoskeletal: No lower extremity edema.  Extremities warm and well perfused.  No midline spinal tenderness.  Bilateral posterior ribs mild tenderness and anterior lower rib border tenderness with no step-offs or crepitus.  Right knee with some swelling and crepitus, with no focal bony tenderness.  Decreased range of motion which is chronic. Neurologic:  Normal speech and language. No gross focal neurologic deficits are appreciated.  Skin:  Skin is warm and dry. No rash noted. Psychiatric: Mood and affect are normal. Speech and behavior are normal.  ____________________________________________   LABS (all labs ordered are listed, but only abnormal results are displayed)  Labs Reviewed - No data to display ____________________________________________  EKG   ____________________________________________  RADIOLOGY  XR ribs bilateral: No acute fracture XR R knee: No acute fracture  ____________________________________________   PROCEDURES  Procedure(s) performed: No  Procedures  Critical Care performed: No ____________________________________________   INITIAL IMPRESSION / ASSESSMENT AND PLAN / ED COURSE  Pertinent labs & imaging results that were available during my care of the patient were reviewed by me and considered in my medical decision making (see chart for details).  84 year old female presents with persistent pain to right knee as well as bilateral ribs after a mechanical fall several weeks ago.  The patient was evaluated by her doctor  at that time but no imaging was obtained.  The exam is as described above.  The patient has no abdominal tenderness, and there is no evidence of head injury.  Given the persistent pain I will obtain bilateral rib series and a right knee x-ray.  No indication for lab work-up at this time.  ----------------------------------------- 12:40 PM on 05/09/2018 -----------------------------------------  Rib and knee x-rays are negative for acute findings.  The patient is already been prescribed hydrocodone by her PCP and states that this does control the pain relatively well.  She feels comfortable to go home.  Return precautions given, the patient expresses understanding.   ____________________________________________   FINAL CLINICAL IMPRESSION(S) / ED DIAGNOSES  Final diagnoses:  Rib pain  Fall  Contusion of rib, unspecified laterality, subsequent encounter  Contusion of right knee, subsequent encounter      NEW MEDICATIONS STARTED DURING THIS VISIT:  New Prescriptions   No medications on file     Note:  This  document was prepared using Systems analyst and may include unintentional dictation errors.    Arta Silence, MD 05/09/18 1240

## 2018-05-09 NOTE — Discharge Instructions (Signed)
Follow-up with Dr. Edwina Barth.  Return to the ER for new or worsening pain, shortness of breath, weakness or numbness, difficulty walking, or any other new or worsening symptoms that concern you.  Take the medication you are prescribed by Dr. Wynetta Emery.

## 2018-05-09 NOTE — ED Notes (Signed)
ED Provider at bedside. 

## 2018-05-09 NOTE — ED Notes (Signed)
Patient transported to X-ray 

## 2018-07-08 ENCOUNTER — Emergency Department: Payer: Medicare Other

## 2018-07-08 ENCOUNTER — Inpatient Hospital Stay: Payer: Medicare Other

## 2018-07-08 ENCOUNTER — Inpatient Hospital Stay: Payer: Medicare Other | Admitting: Anesthesiology

## 2018-07-08 ENCOUNTER — Encounter: Payer: Self-pay | Admitting: *Deleted

## 2018-07-08 ENCOUNTER — Encounter
Admission: EM | Disposition: A | Discharge status: Skilled Nursing Facility | Payer: Self-pay | Source: Home / Self Care | Attending: Internal Medicine

## 2018-07-08 ENCOUNTER — Inpatient Hospital Stay
Admission: EM | Admit: 2018-07-08 | Discharge: 2018-07-10 | DRG: 481 | Disposition: A | Discharge status: Skilled Nursing Facility | Payer: Medicare Other | Attending: Internal Medicine | Admitting: Internal Medicine

## 2018-07-08 ENCOUNTER — Other Ambulatory Visit: Payer: Self-pay

## 2018-07-08 DIAGNOSIS — R739 Hyperglycemia, unspecified: Secondary | ICD-10-CM | POA: Diagnosis present

## 2018-07-08 DIAGNOSIS — I110 Hypertensive heart disease with heart failure: Secondary | ICD-10-CM | POA: Diagnosis present

## 2018-07-08 DIAGNOSIS — S72011A Unspecified intracapsular fracture of right femur, initial encounter for closed fracture: Secondary | ICD-10-CM | POA: Diagnosis present

## 2018-07-08 DIAGNOSIS — I447 Left bundle-branch block, unspecified: Secondary | ICD-10-CM | POA: Diagnosis present

## 2018-07-08 DIAGNOSIS — I252 Old myocardial infarction: Secondary | ICD-10-CM | POA: Diagnosis not present

## 2018-07-08 DIAGNOSIS — Z419 Encounter for procedure for purposes other than remedying health state, unspecified: Secondary | ICD-10-CM

## 2018-07-08 DIAGNOSIS — Z79899 Other long term (current) drug therapy: Secondary | ICD-10-CM | POA: Diagnosis not present

## 2018-07-08 DIAGNOSIS — W1830XA Fall on same level, unspecified, initial encounter: Secondary | ICD-10-CM | POA: Diagnosis present

## 2018-07-08 DIAGNOSIS — D638 Anemia in other chronic diseases classified elsewhere: Secondary | ICD-10-CM | POA: Diagnosis present

## 2018-07-08 DIAGNOSIS — M7121 Synovial cyst of popliteal space [Baker], right knee: Secondary | ICD-10-CM

## 2018-07-08 DIAGNOSIS — Y92009 Unspecified place in unspecified non-institutional (private) residence as the place of occurrence of the external cause: Secondary | ICD-10-CM | POA: Diagnosis not present

## 2018-07-08 DIAGNOSIS — Z7982 Long term (current) use of aspirin: Secondary | ICD-10-CM

## 2018-07-08 DIAGNOSIS — K5900 Constipation, unspecified: Secondary | ICD-10-CM | POA: Diagnosis not present

## 2018-07-08 DIAGNOSIS — Z880 Allergy status to penicillin: Secondary | ICD-10-CM

## 2018-07-08 DIAGNOSIS — I739 Peripheral vascular disease, unspecified: Secondary | ICD-10-CM | POA: Diagnosis present

## 2018-07-08 DIAGNOSIS — I5022 Chronic systolic (congestive) heart failure: Secondary | ICD-10-CM | POA: Diagnosis present

## 2018-07-08 DIAGNOSIS — D72829 Elevated white blood cell count, unspecified: Secondary | ICD-10-CM | POA: Diagnosis present

## 2018-07-08 DIAGNOSIS — Z01810 Encounter for preprocedural cardiovascular examination: Secondary | ICD-10-CM

## 2018-07-08 DIAGNOSIS — Z8673 Personal history of transient ischemic attack (TIA), and cerebral infarction without residual deficits: Secondary | ICD-10-CM | POA: Diagnosis not present

## 2018-07-08 DIAGNOSIS — I420 Dilated cardiomyopathy: Secondary | ICD-10-CM | POA: Diagnosis present

## 2018-07-08 HISTORY — DX: Left bundle-branch block, unspecified: I44.7

## 2018-07-08 HISTORY — PX: HIP PINNING,CANNULATED: SHX1758

## 2018-07-08 LAB — BASIC METABOLIC PANEL
Anion gap: 6 (ref 5–15)
BUN: 13 mg/dL (ref 8–23)
CHLORIDE: 105 mmol/L (ref 98–111)
CO2: 25 mmol/L (ref 22–32)
CREATININE: 1.01 mg/dL — AB (ref 0.44–1.00)
Calcium: 8.9 mg/dL (ref 8.9–10.3)
GFR calc non Af Amer: 47 mL/min — ABNORMAL LOW (ref >60)
GFR, EST AFRICAN AMERICAN: 54 mL/min — AB (ref >60)
Glucose, Bld: 112 mg/dL — ABNORMAL HIGH (ref 70–99)
Potassium: 3.9 mmol/L (ref 3.5–5.1)
Sodium: 136 mmol/L (ref 135–145)

## 2018-07-08 LAB — PROTIME-INR
INR: 1.11
Prothrombin Time: 14.2 seconds (ref 11.4–15.2)

## 2018-07-08 LAB — SURGICAL PCR SCREEN
MRSA, PCR: NEGATIVE
STAPHYLOCOCCUS AUREUS: NEGATIVE

## 2018-07-08 LAB — CBC WITH DIFFERENTIAL/PLATELET
Basophils Absolute: 0.1 10*3/uL (ref 0–0.1)
Basophils Relative: 1 %
Eosinophils Absolute: 0 10*3/uL (ref 0–0.7)
Eosinophils Relative: 0 %
HEMATOCRIT: 34.7 % — AB (ref 35.0–47.0)
HEMOGLOBIN: 11.8 g/dL — AB (ref 12.0–16.0)
LYMPHS ABS: 0.8 10*3/uL — AB (ref 1.0–3.6)
Lymphocytes Relative: 6 %
MCH: 29.7 pg (ref 26.0–34.0)
MCHC: 34.1 g/dL (ref 32.0–36.0)
MCV: 87.2 fL (ref 80.0–100.0)
MONOS PCT: 6 %
Monocytes Absolute: 0.8 10*3/uL (ref 0.2–0.9)
NEUTROS ABS: 11.3 10*3/uL — AB (ref 1.4–6.5)
NEUTROS PCT: 87 %
Platelets: 254 10*3/uL (ref 150–440)
RBC: 3.98 MIL/uL (ref 3.80–5.20)
RDW: 14.1 % (ref 11.5–14.5)
WBC: 13.1 10*3/uL — ABNORMAL HIGH (ref 3.6–11.0)

## 2018-07-08 LAB — TYPE AND SCREEN
ABO/RH(D): O POS
Antibody Screen: NEGATIVE

## 2018-07-08 LAB — URINALYSIS, ROUTINE W REFLEX MICROSCOPIC
BILIRUBIN URINE: NEGATIVE
Bacteria, UA: NONE SEEN
GLUCOSE, UA: NEGATIVE mg/dL
Hgb urine dipstick: NEGATIVE
KETONES UR: NEGATIVE mg/dL
LEUKOCYTES UA: NEGATIVE
Nitrite: NEGATIVE
PH: 6 (ref 5.0–8.0)
Protein, ur: NEGATIVE mg/dL
Specific Gravity, Urine: 1.014 (ref 1.005–1.030)
WBC, UA: NONE SEEN WBC/hpf (ref 0–5)

## 2018-07-08 IMAGING — CT CT PELVIS W/O CM
2 of 3 series · 17 of 46 positions shown, 19 images · non-contrast
Comparison: Right hip radiographs [DATE]. CT abdomen and pelvis
[DATE]

CLINICAL DATA: Right hip and groin pain after a fall.

EXAM:
CT PELVIS WITHOUT CONTRAST
TECHNIQUE: Multidetector CT imaging of the pelvis was performed following the
standard protocol without intravenous contrast.

[Series 3: axial st · axial · 0.67mm/px · z∈[-914,-684]mm · 14 of 133 slices shown, 16 images]
[im 9/133  soft-tissue]
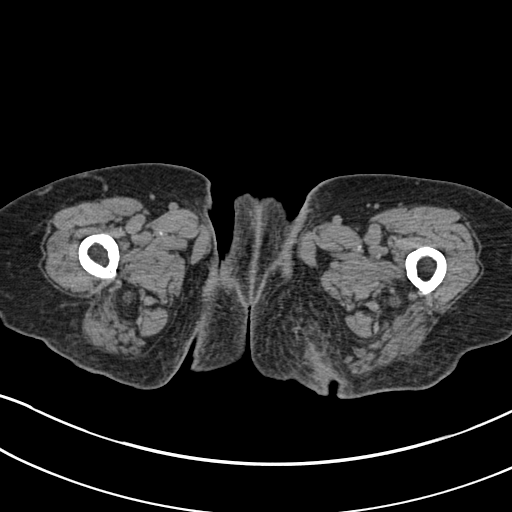
[im 9/133  bone]
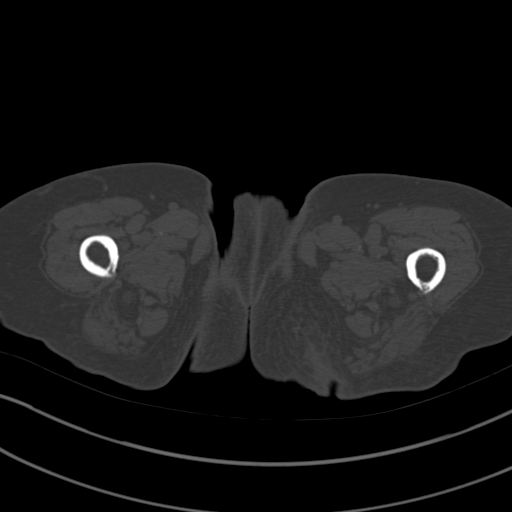
[im 18/133  soft-tissue]
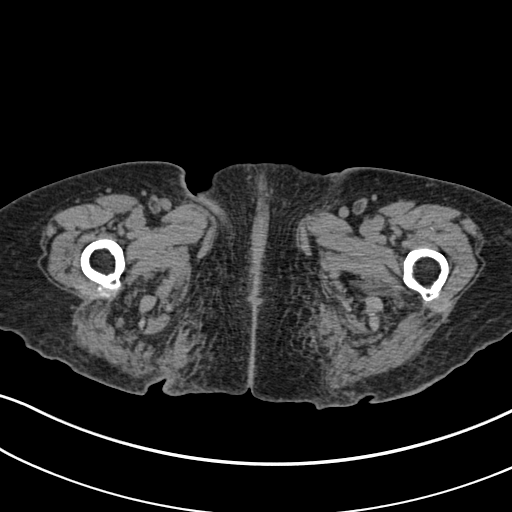
[im 26/133  soft-tissue]
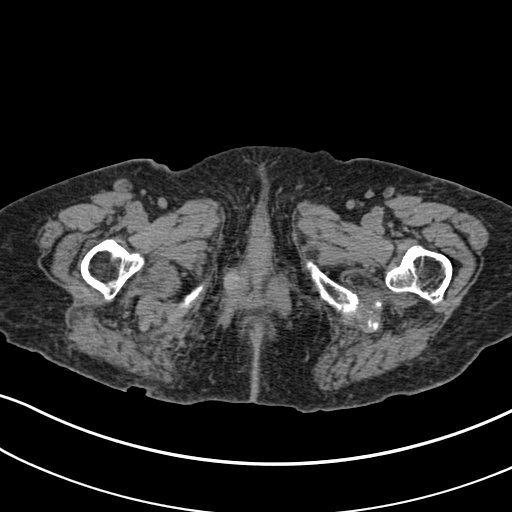
[im 35/133  soft-tissue]
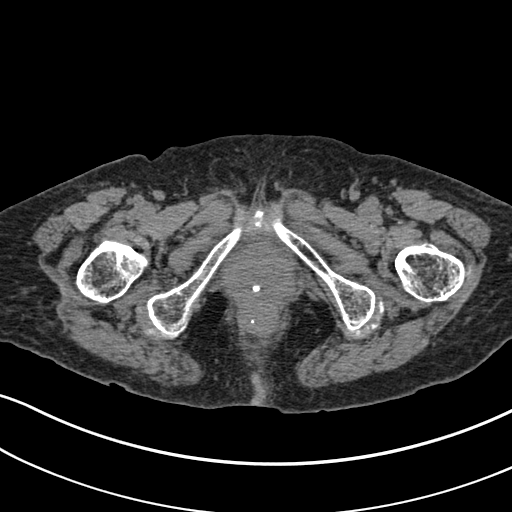
[im 43/133  soft-tissue]
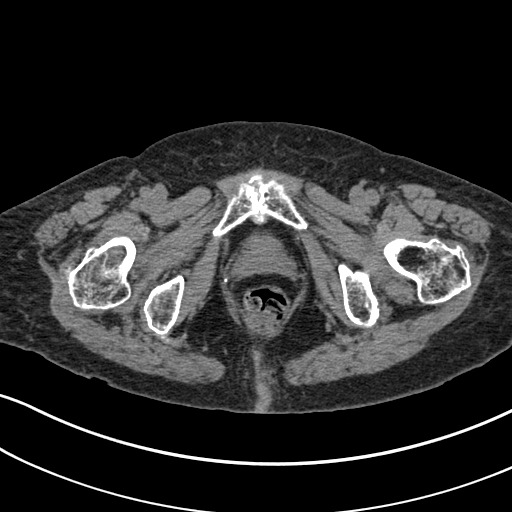
[im 52/133  soft-tissue]
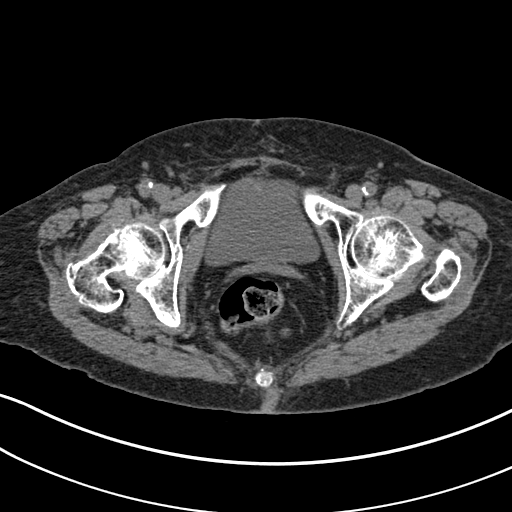
[im 60/133  soft-tissue]
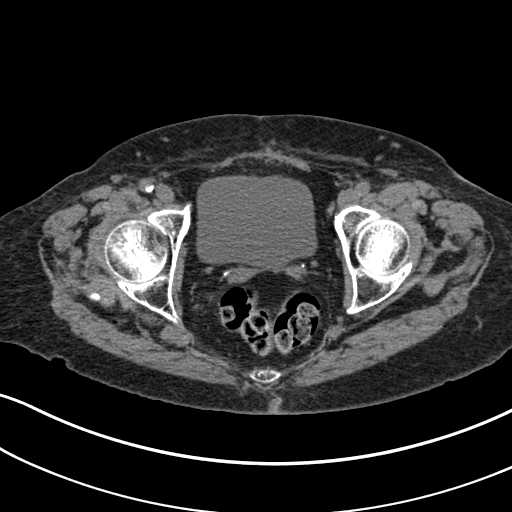
[im 73/133  soft-tissue]
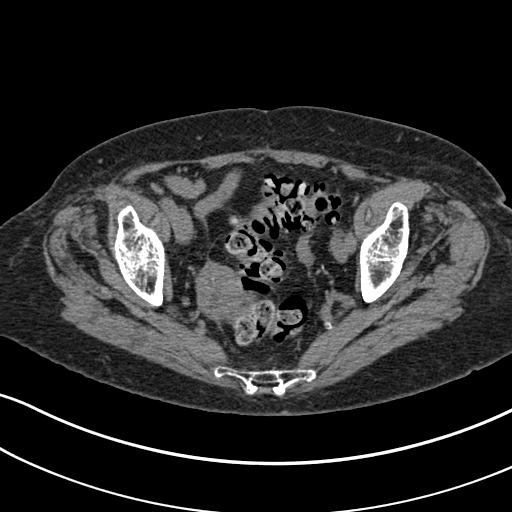
[im 81/133  soft-tissue]
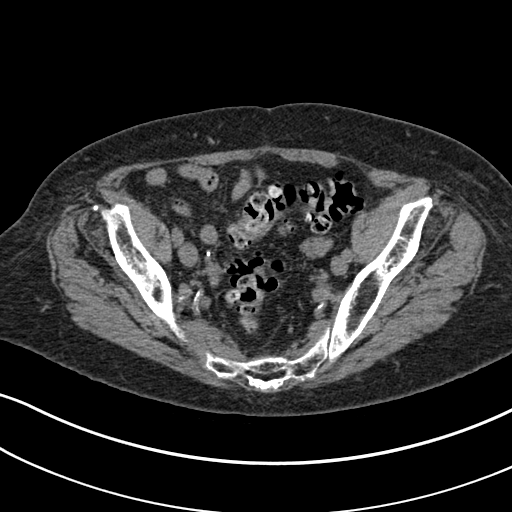
[im 81/133  bone]
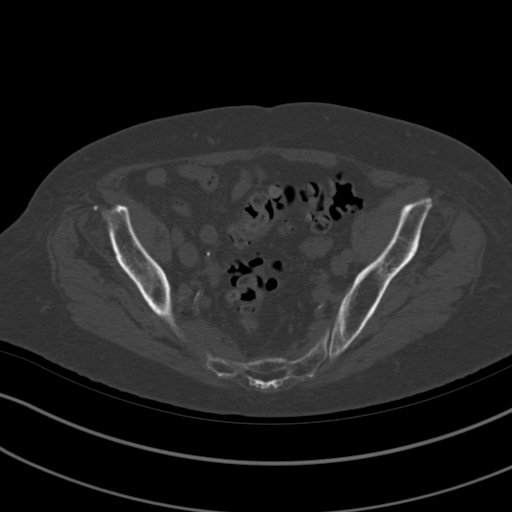
[im 90/133  soft-tissue]
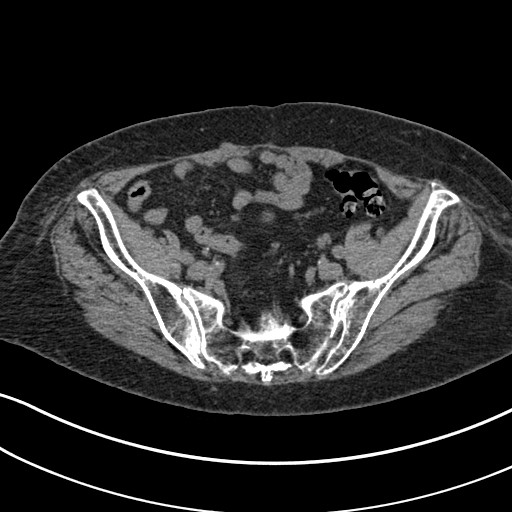
[im 98/133  soft-tissue]
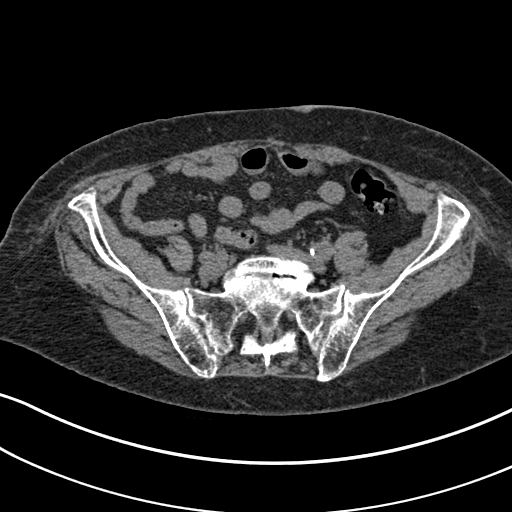
[im 107/133  soft-tissue]
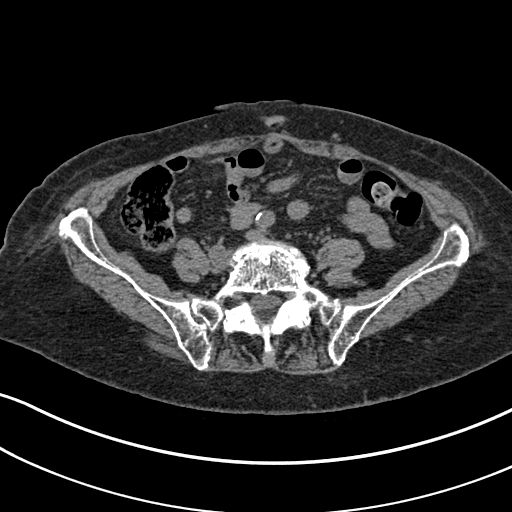
[im 115/133  soft-tissue]
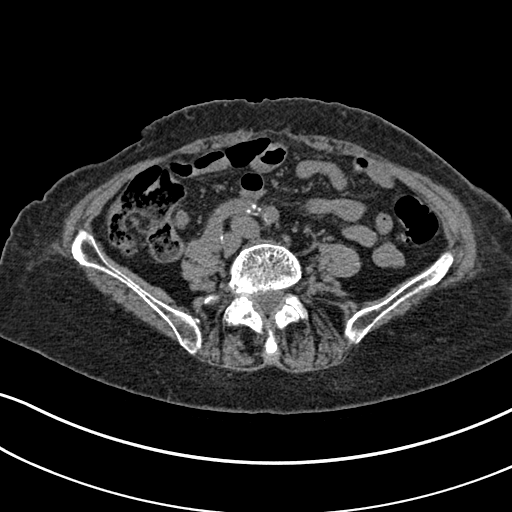
[im 124/133  soft-tissue]
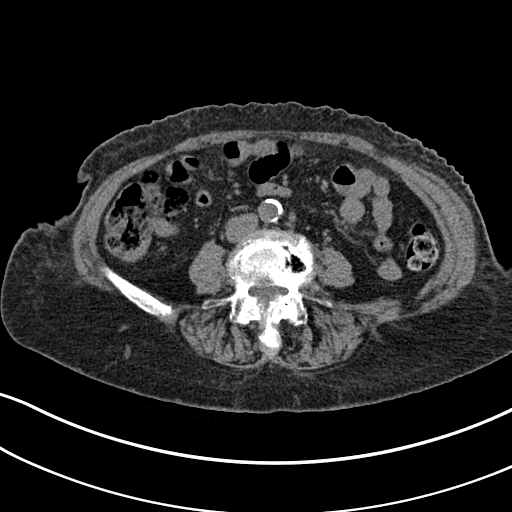

[Series 7: coronal st · coronal · 0.55mm/px · 3 of 89 slices shown]
[im 30/89  soft-tissue]
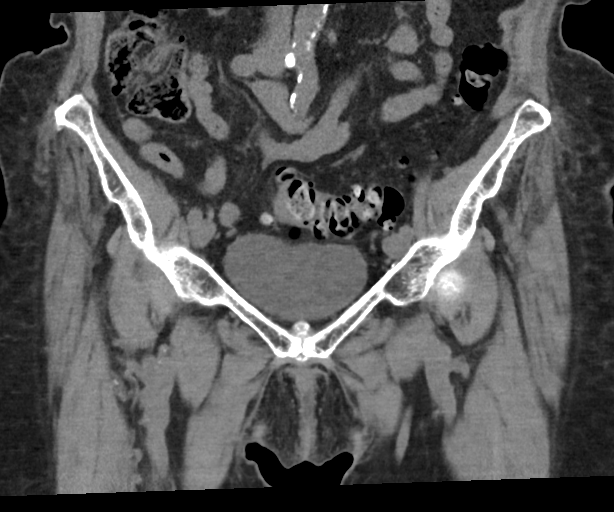
[im 40/89  soft-tissue]
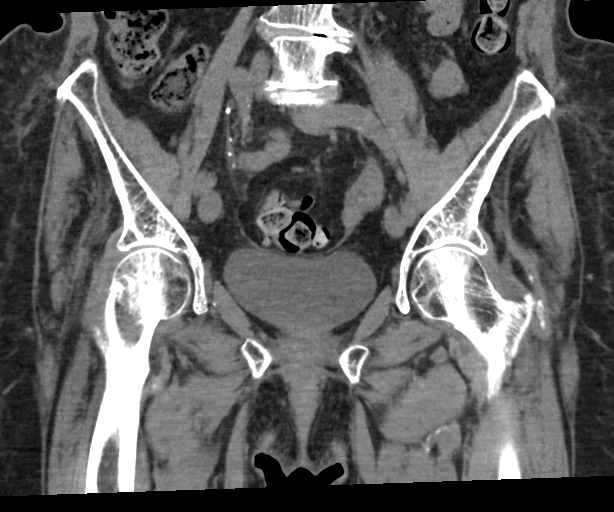
[im 49/89  soft-tissue]
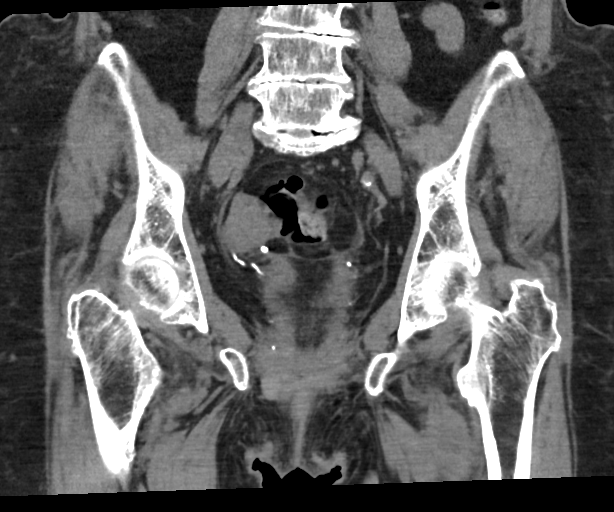

[17 of 46 positions shown; findings below may reference images not displayed]

FINDINGS: Urinary Tract:  No abnormality visualized.

Bowel:  Unremarkable visualized pelvic bowel loops.

Vascular/Lymphatic: Aorto iliac calcifications. No significant
lymphadenopathy.

Reproductive: Uterus is surgically absent. Asymmetric enlargement of
right ovary, measuring 4.6 cm maximal diameter. Left ovary is
unremarkable.

Other:  No free air or free fluid in the pelvis.

Musculoskeletal: There is an impacted subcapital fracture of the
proximal right femur. Pelvis and sacrum appear otherwise intact.
Degenerative changes in the lower lumbar spine and hips. Small right
hip effusion.
IMPRESSION: Impacted subcapital fracture of the proximal right femur. No other
fractures identified.

## 2018-07-08 IMAGING — DX DG CHEST 1V PORT
1 series · 1 of 1 positions shown · non-contrast
Comparison: [DATE]

CLINICAL DATA: Preoperative

EXAM:
PORTABLE CHEST 1 VIEW

[chest ap]
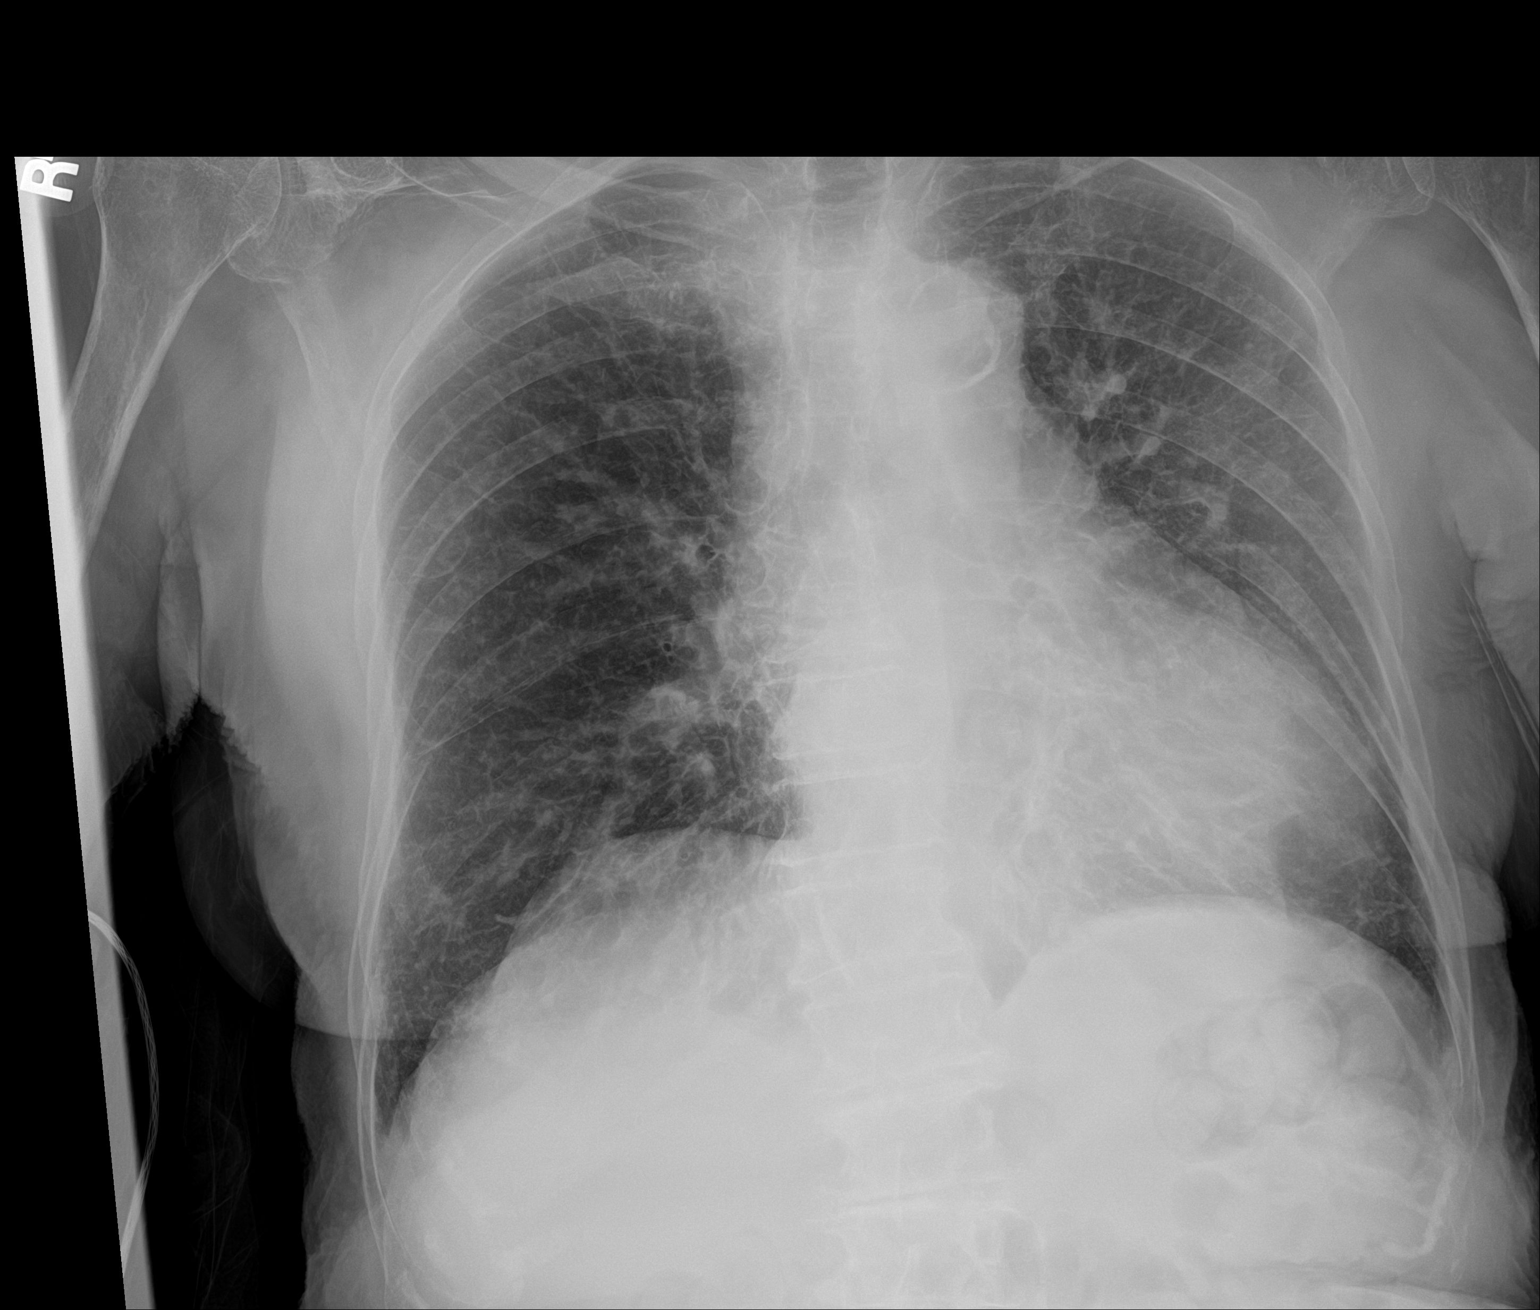

[1 of 1 positions shown; findings below may reference images not displayed]

FINDINGS: Borderline heart size with normal pulmonary vascularity. Diffuse
coarse interstitial pattern to the lungs likely representing
fibrosis. No airspace disease or consolidation in the lungs. No
blunting of costophrenic angles. No pneumothorax. Mediastinal
contours appear intact. Calcification of the aorta. Degenerative
changes in the spine and shoulders.
IMPRESSION: Chronic interstitial fibrosis in the lungs. No evidence of active
pulmonary disease. Aortic atherosclerosis.

## 2018-07-08 IMAGING — CR DG HIP (WITH OR WITHOUT PELVIS) 2-3V*R*
1 series · 3 of 3 positions shown · non-contrast
Comparison: None.

CLINICAL DATA: Extreme pain and right hip and groin region after a
fall on [DATE] at 8 p.m..

EXAM:
DG HIP (WITH OR WITHOUT PELVIS) 2-3V RIGHT

[Series 1: dg hip unilat w or w/o pelvis 2-3 views  · non-contrast · 0.14mm/px · 3 of 3 slices shown]
[im 1/3]
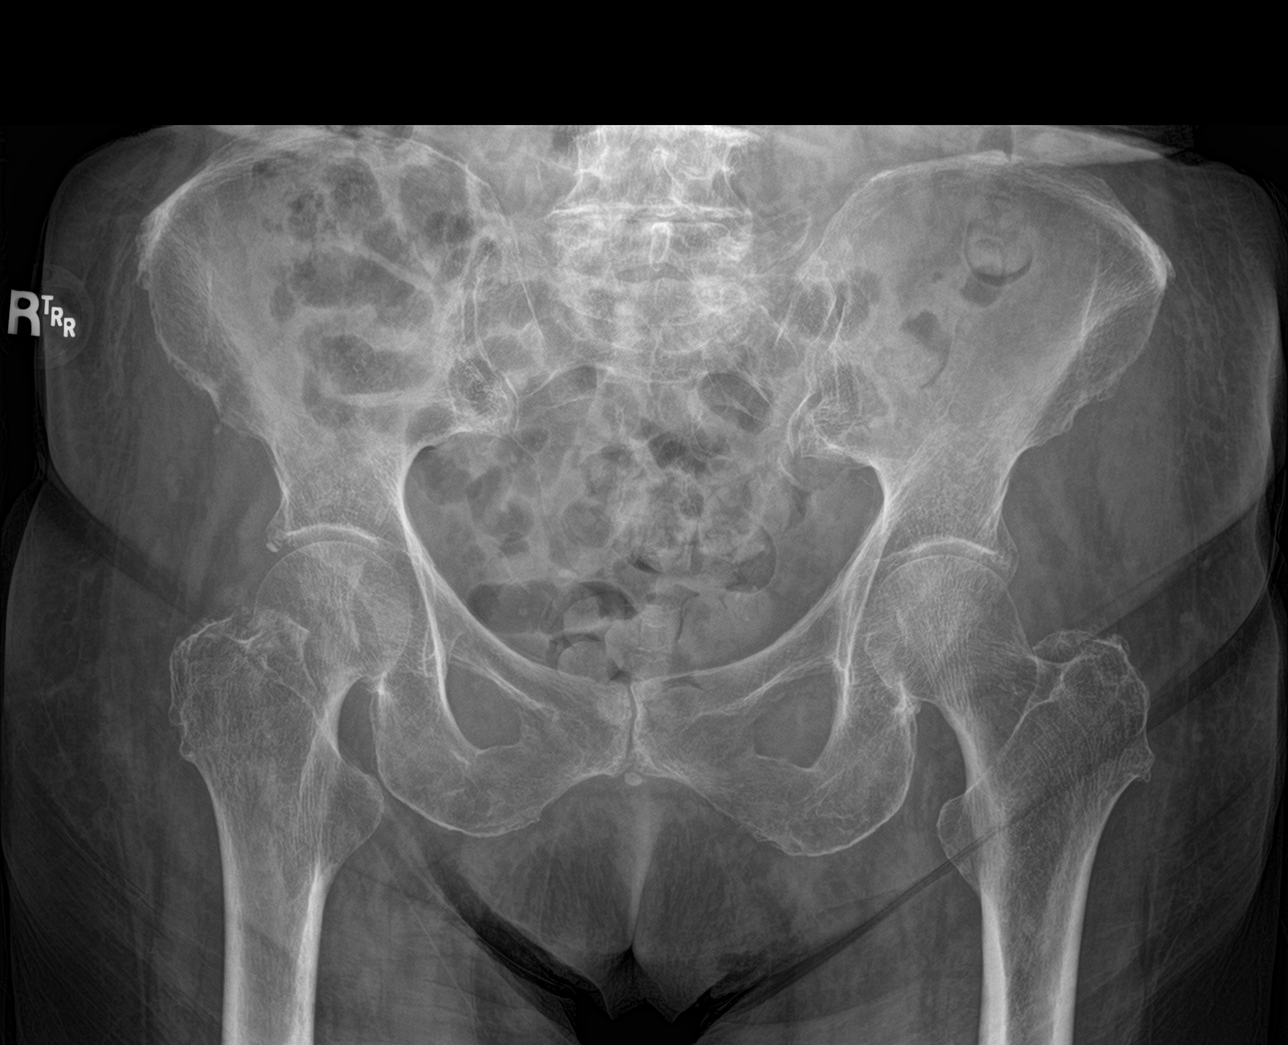
[im 2/3]
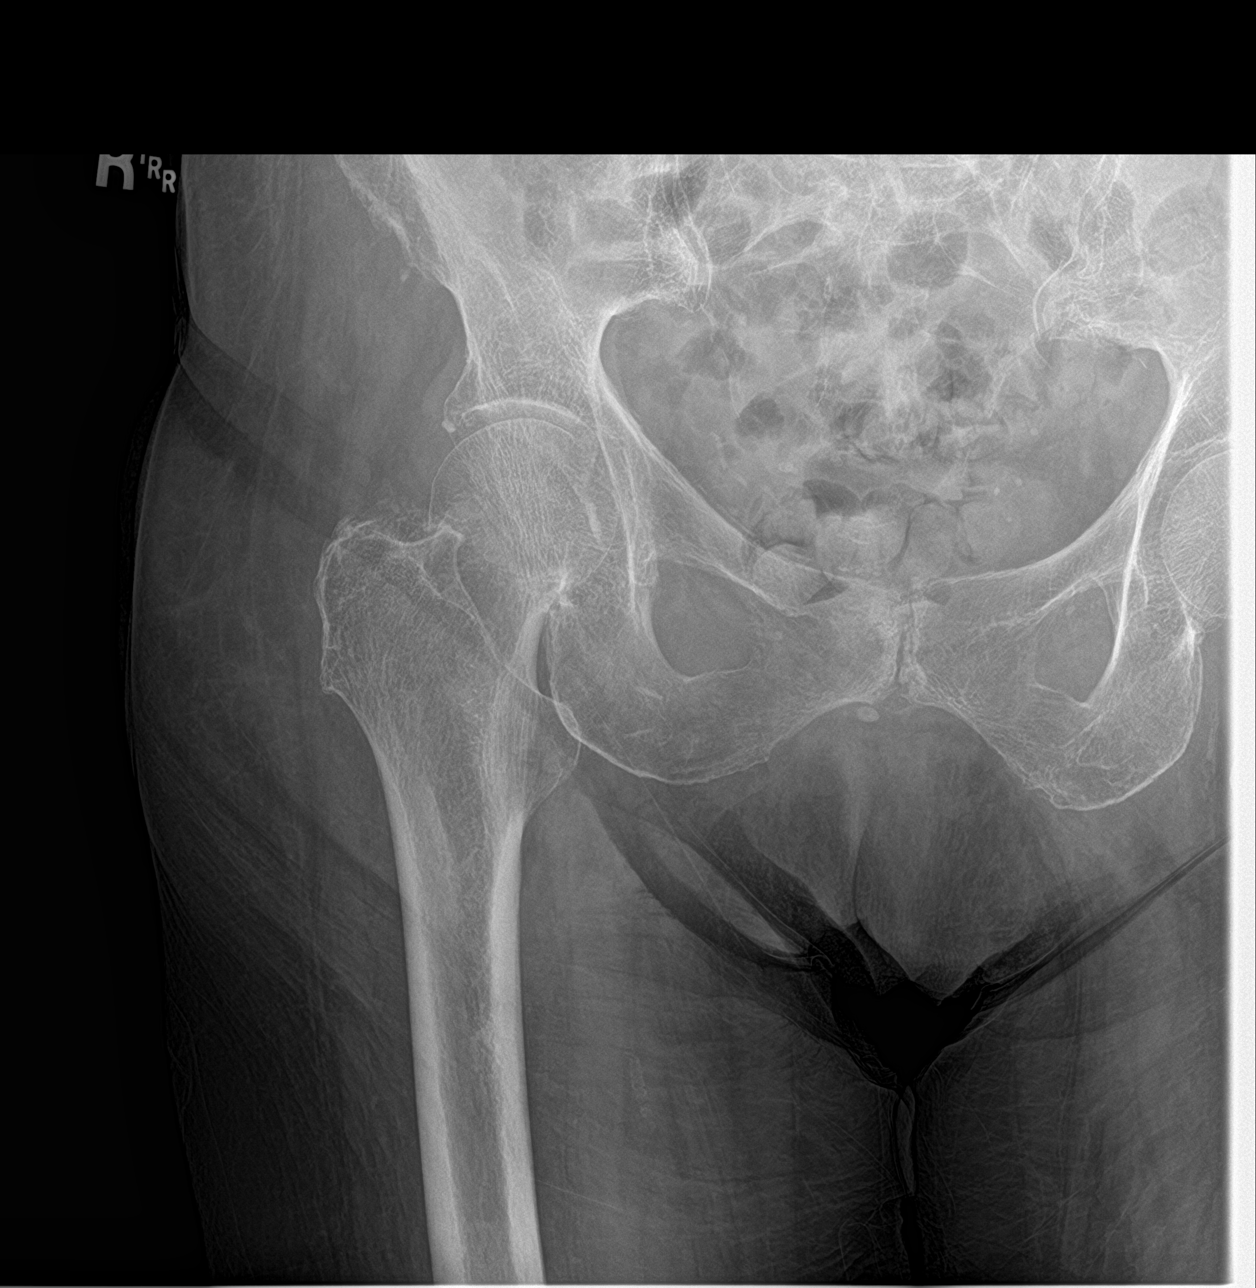
[im 3/3]
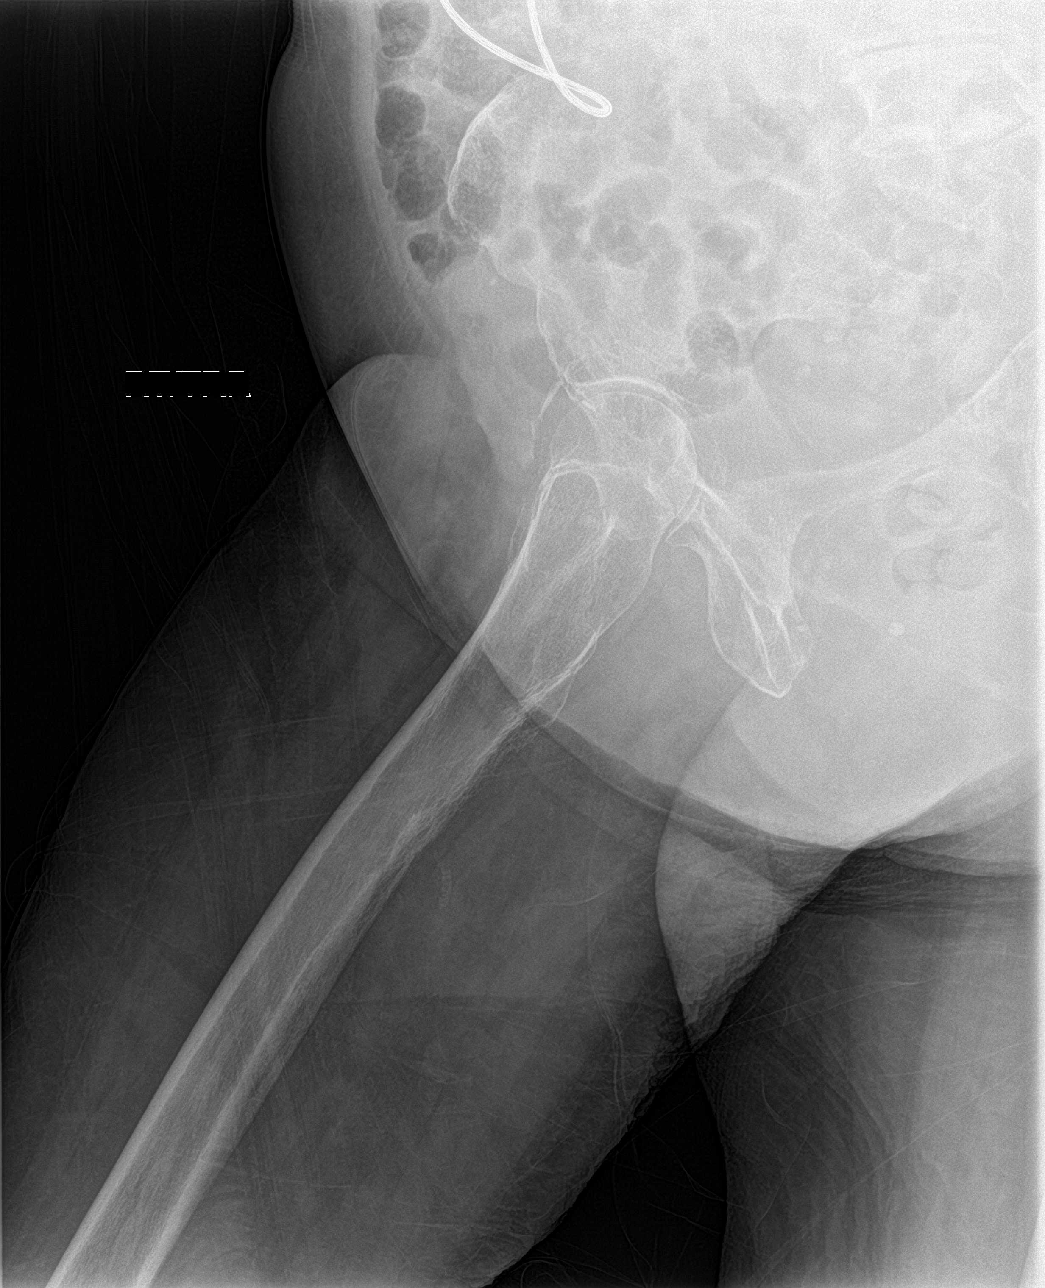

[3 of 3 positions shown; findings below may reference images not displayed]

FINDINGS: Degenerative changes in the lower lumbar spine and hips. Pelvis
appears intact. SI joints and symphysis pubis are not displaced.
Evaluation of right hip is limited due to rotation. There appears to
be slight deformity of the subcapital femoral neck suggesting a
possible impacted subcapital fracture. No displacement or
dislocation.
IMPRESSION: Degenerative changes in the lower lumbar spine and hips. Possible
impacted subcapital fracture of the right femoral neck. No
displacement or dislocation.

## 2018-07-08 IMAGING — XA DG HIP (WITH PELVIS) OPERATIVE*R*
7 series · 7 of 7 positions shown · non-contrast
Comparison: [DATE]

CLINICAL DATA: Operative right hip

EXAM:
OPERATIVE right HIP (WITH PELVIS IF PERFORMED) 7 VIEWS
TECHNIQUE: Fluoroscopic spot image(s) were submitted for interpretation
post-operatively.

[Series 1: cont. · 1 of 1 slices shown (1 of 7)]
[im 1/1]
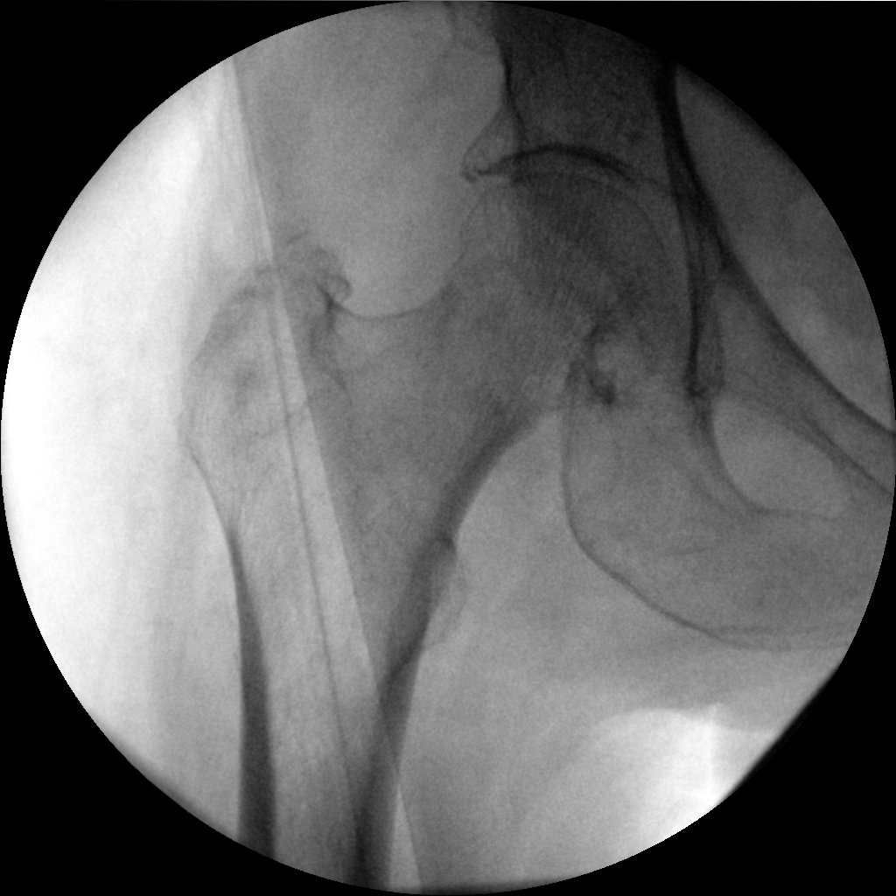

[Series 3: cont. · 1 of 1 slices shown (2 of 7)]
[im 1/1]
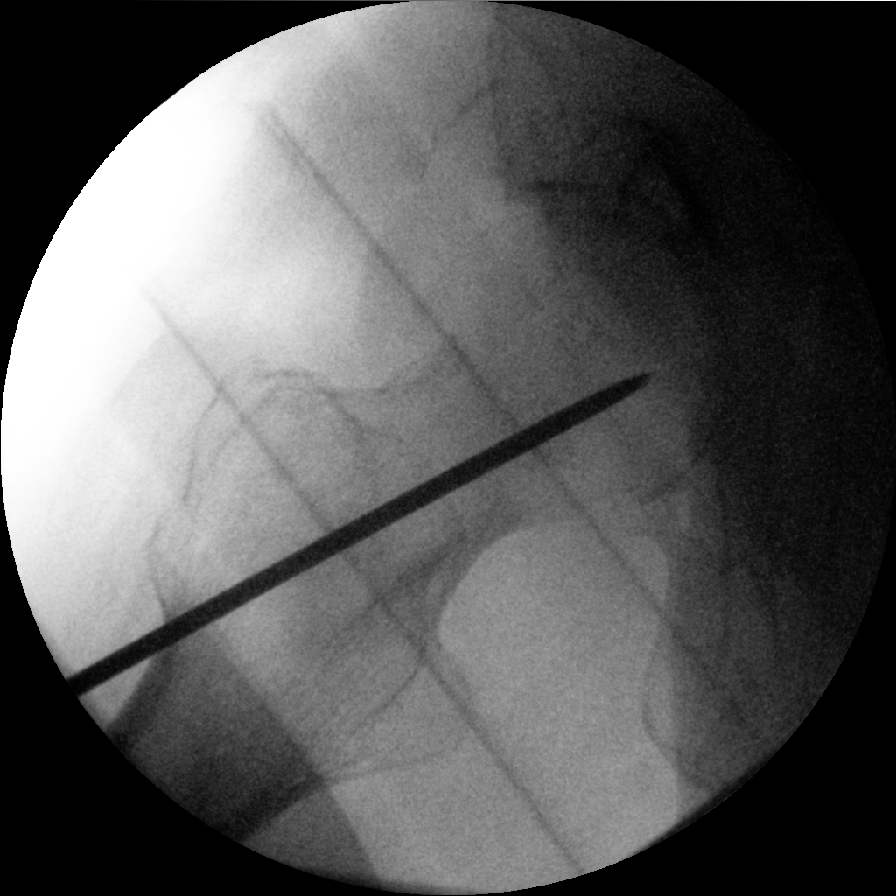

[Series 9: cont. · 1 of 1 slices shown (3 of 7)]
[im 1/1]
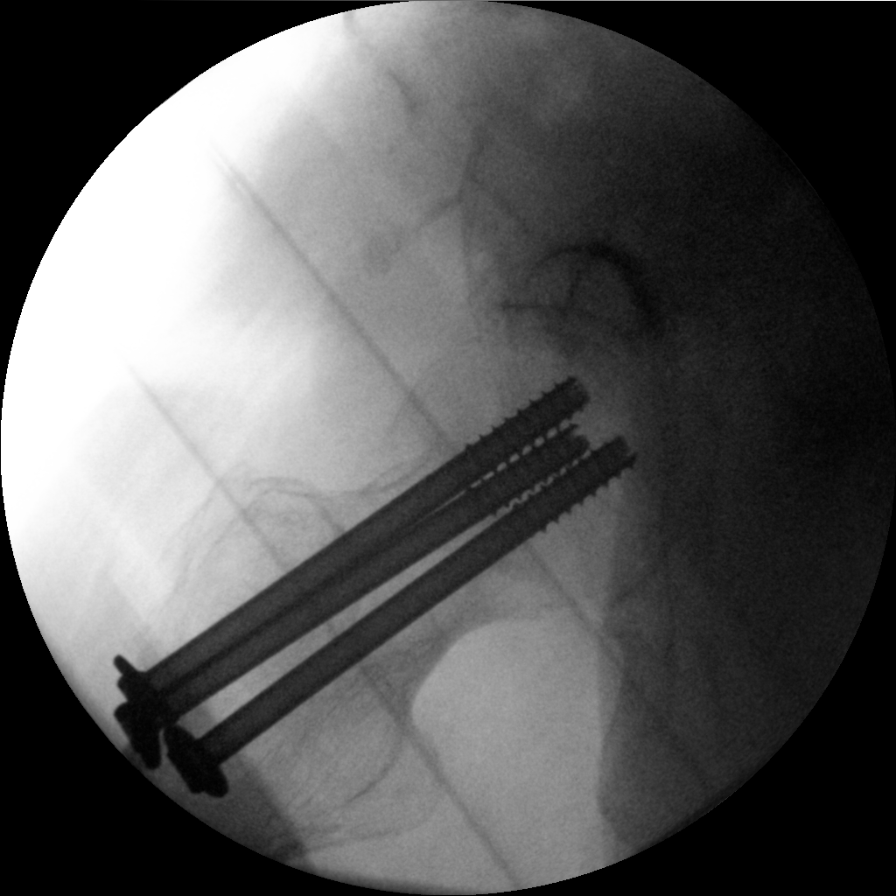

[Series 10: cont. · 1 of 1 slices shown (4 of 7)]
[im 1/1]
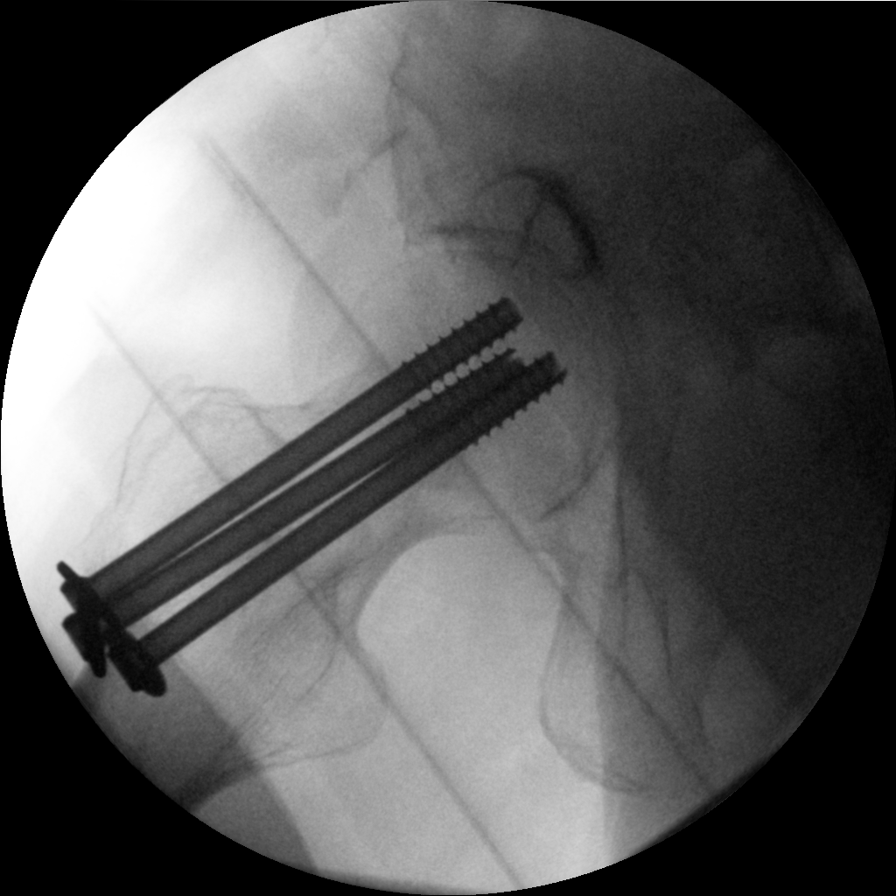

[Series 11: cont. · 1 of 1 slices shown (5 of 7)]
[im 1/1]
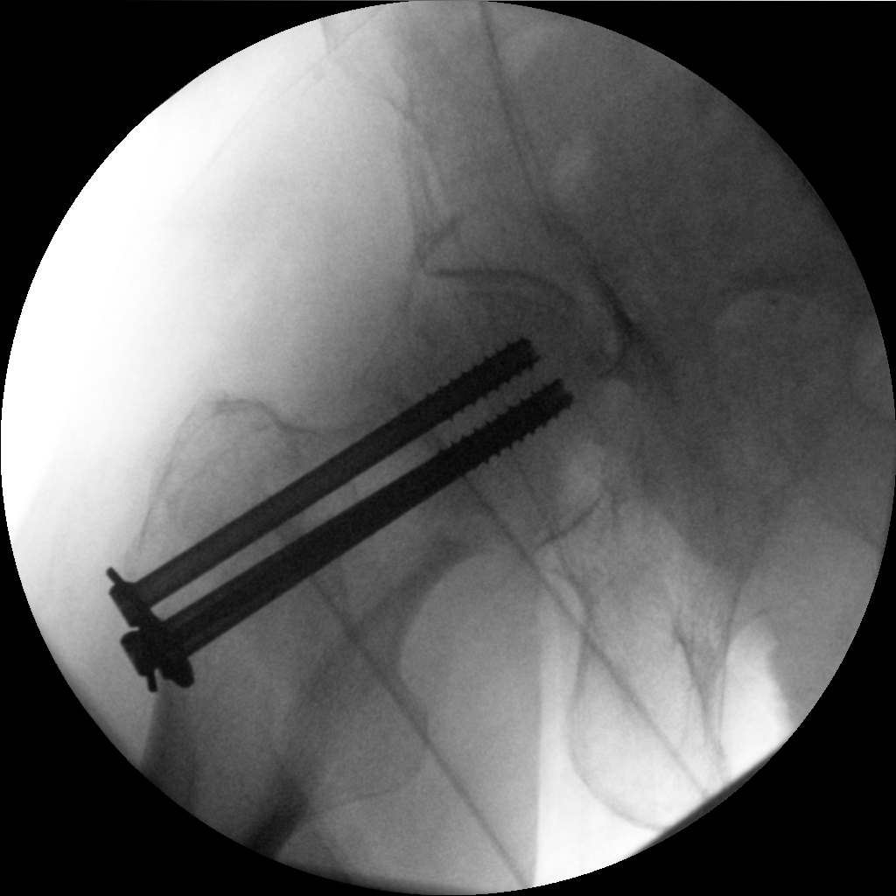

[Series 12: cont. · 1 of 1 slices shown (6 of 7)]
[im 1/1]
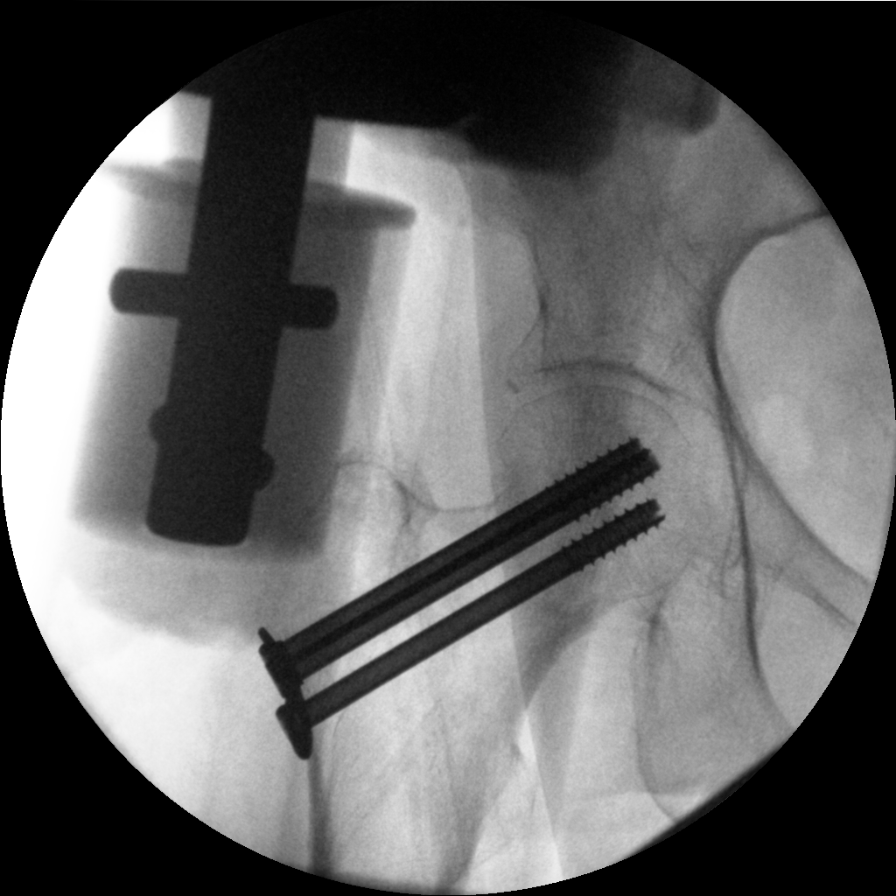

[Series 13: cont. · 1 of 1 slices shown (7 of 7)]
[im 1/1]
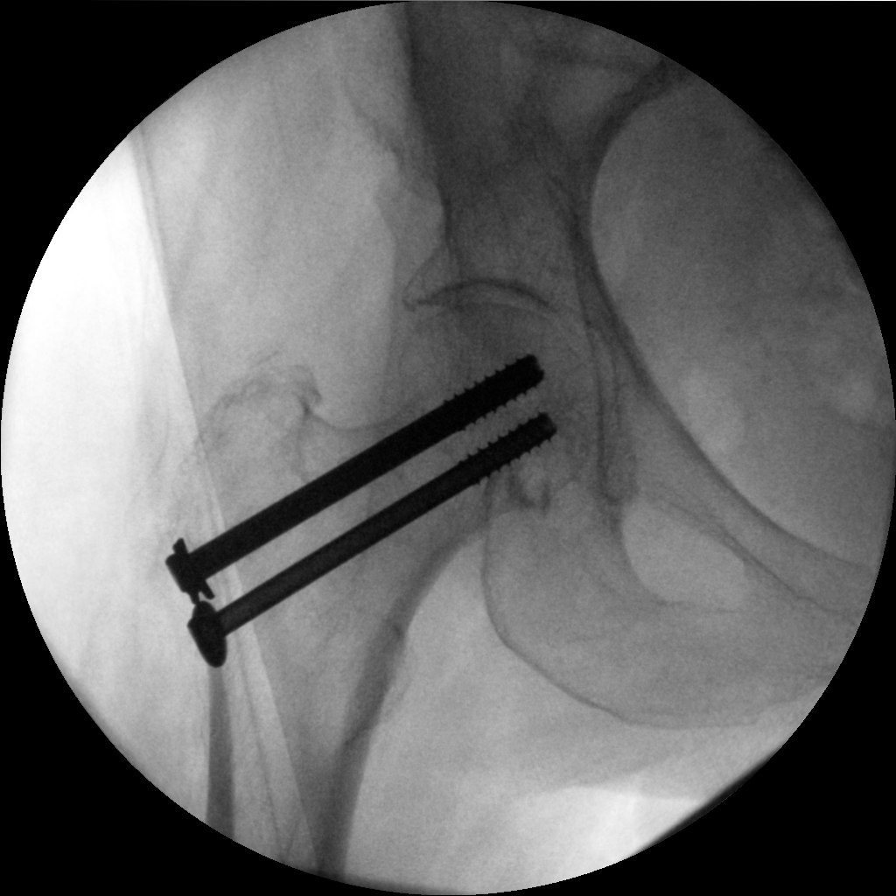

[7 of 7 positions shown; findings below may reference images not displayed]

FINDINGS: Seven low resolution intraoperative spot views of the right hip.
Images were obtained during operative threaded screw fixation of
right femoral neck fracture.
IMPRESSION: Intraoperative fluoroscopic assistance provided during surgical
fixation of right femoral neck fracture.

## 2018-07-08 SURGERY — FIXATION, FEMUR, NECK, PERCUTANEOUS, USING SCREW
Anesthesia: General | Site: Hip | Laterality: Right | Wound class: "Clean "

## 2018-07-08 MED ORDER — ENOXAPARIN SODIUM 40 MG/0.4ML ~~LOC~~ SOLN: 40.0000 mg | SUBCUTANEOUS | Status: DC

## 2018-07-08 MED ORDER — SENNOSIDES-DOCUSATE SODIUM 8.6-50 MG PO TABS: 1.0000 | ORAL_TABLET | Freq: Every evening | ORAL | Status: DC | PRN

## 2018-07-08 MED ORDER — BUPIVACAINE HCL (PF) 0.5 % IJ SOLN
INTRAMUSCULAR | Status: AC
  Filled 2018-07-08: qty 30

## 2018-07-08 MED ORDER — DOCUSATE SODIUM 100 MG PO CAPS
100.0000 mg | ORAL_CAPSULE | Freq: Two times a day (BID) | ORAL | Status: DC
  Administered 2018-07-09 (×2): 100 mg via ORAL
  Filled 2018-07-08 (×3): qty 1

## 2018-07-08 MED ORDER — SODIUM CHLORIDE 0.9 % IV SOLN
INTRAVENOUS | Status: DC | PRN
  Administered 2018-07-08: 25 ug/min via INTRAVENOUS

## 2018-07-08 MED ORDER — SODIUM CHLORIDE 0.9 % IV SOLN
INTRAVENOUS | Status: DC
  Administered 2018-07-08 – 2018-07-09 (×2): via INTRAVENOUS

## 2018-07-08 MED ORDER — ONDANSETRON HCL 4 MG/2ML IJ SOLN: 4.0000 mg | Freq: Four times a day (QID) | INTRAMUSCULAR | Status: DC | PRN

## 2018-07-08 MED ORDER — ESCITALOPRAM OXALATE 10 MG PO TABS
5.0000 mg | ORAL_TABLET | Freq: Every day | ORAL | Status: DC
  Administered 2018-07-09 – 2018-07-10 (×2): 5 mg via ORAL
  Filled 2018-07-08 (×3): qty 0.5

## 2018-07-08 MED ORDER — ENOXAPARIN SODIUM 30 MG/0.3ML ~~LOC~~ SOLN
30.0000 mg | SUBCUTANEOUS | Status: DC
  Administered 2018-07-09 – 2018-07-10 (×2): 30 mg via SUBCUTANEOUS
  Filled 2018-07-08 (×2): qty 0.3

## 2018-07-08 MED ORDER — SUGAMMADEX SODIUM 500 MG/5ML IV SOLN
INTRAVENOUS | Status: DC | PRN
  Administered 2018-07-08: 120 mg via INTRAVENOUS

## 2018-07-08 MED ORDER — FENTANYL CITRATE (PF) 100 MCG/2ML IJ SOLN
INTRAMUSCULAR | Status: AC
  Filled 2018-07-08: qty 2

## 2018-07-08 MED ORDER — METOCLOPRAMIDE HCL 5 MG/ML IJ SOLN: 5.0000 mg | Freq: Three times a day (TID) | INTRAMUSCULAR | Status: DC | PRN

## 2018-07-08 MED ORDER — ENSURE ENLIVE PO LIQD
237.0000 mL | Freq: Two times a day (BID) | ORAL | Status: DC
  Administered 2018-07-09 (×2): 237 mL via ORAL

## 2018-07-08 MED ORDER — RAMIPRIL 10 MG PO CAPS
10.0000 mg | ORAL_CAPSULE | Freq: Two times a day (BID) | ORAL | Status: DC
  Administered 2018-07-09 – 2018-07-10 (×3): 10 mg via ORAL
  Filled 2018-07-08 (×6): qty 1

## 2018-07-08 MED ORDER — ETOMIDATE 2 MG/ML IV SOLN
INTRAVENOUS | Status: AC
  Filled 2018-07-08: qty 10

## 2018-07-08 MED ORDER — MORPHINE SULFATE (PF) 2 MG/ML IV SOLN
2.0000 mg | INTRAVENOUS | Status: DC | PRN
  Administered 2018-07-08: 2 mg via INTRAVENOUS
  Filled 2018-07-08: qty 1

## 2018-07-08 MED ORDER — BISACODYL 10 MG RE SUPP: 10.0000 mg | Freq: Every day | RECTAL | Status: DC | PRN

## 2018-07-08 MED ORDER — CEFAZOLIN SODIUM-DEXTROSE 1-4 GM/50ML-% IV SOLN
1.0000 g | Freq: Four times a day (QID) | INTRAVENOUS | Status: AC
  Administered 2018-07-09 (×3): 1 g via INTRAVENOUS
  Filled 2018-07-08 (×3): qty 50

## 2018-07-08 MED ORDER — SODIUM CHLORIDE 0.9 % IR SOLN
Status: DC | PRN
  Administered 2018-07-08: 100 mL

## 2018-07-08 MED ORDER — ENOXAPARIN SODIUM 30 MG/0.3ML ~~LOC~~ SOLN: 30.0000 mg | SUBCUTANEOUS | Status: DC

## 2018-07-08 MED ORDER — PHENYLEPHRINE HCL 10 MG/ML IJ SOLN
INTRAMUSCULAR | Status: AC
  Filled 2018-07-08: qty 1

## 2018-07-08 MED ORDER — METHOCARBAMOL 1000 MG/10ML IJ SOLN
500.0000 mg | Freq: Four times a day (QID) | INTRAVENOUS | Status: DC | PRN
  Filled 2018-07-08: qty 5

## 2018-07-08 MED ORDER — OXYCODONE HCL 5 MG PO TABS
2.5000 mg | ORAL_TABLET | ORAL | Status: DC | PRN
  Administered 2018-07-09 – 2018-07-10 (×2): 5 mg via ORAL
  Filled 2018-07-08 (×3): qty 1

## 2018-07-08 MED ORDER — FENTANYL CITRATE (PF) 100 MCG/2ML IJ SOLN
INTRAMUSCULAR | Status: DC | PRN
  Administered 2018-07-08: 50 ug via INTRAVENOUS
  Administered 2018-07-08 (×2): 25 ug via INTRAVENOUS

## 2018-07-08 MED ORDER — SUGAMMADEX SODIUM 200 MG/2ML IV SOLN
INTRAVENOUS | Status: AC
  Filled 2018-07-08: qty 2

## 2018-07-08 MED ORDER — HYDROMORPHONE HCL 1 MG/ML IJ SOLN: 0.2500 mg | INTRAMUSCULAR | Status: DC | PRN

## 2018-07-08 MED ORDER — FENTANYL CITRATE (PF) 100 MCG/2ML IJ SOLN: 25.0000 ug | INTRAMUSCULAR | Status: DC | PRN

## 2018-07-08 MED ORDER — EPHEDRINE SULFATE 50 MG/ML IJ SOLN
INTRAMUSCULAR | Status: DC | PRN
  Administered 2018-07-08: 10 mg via INTRAVENOUS

## 2018-07-08 MED ORDER — MORPHINE SULFATE (PF) 2 MG/ML IV SOLN
2.0000 mg | Freq: Once | INTRAVENOUS | Status: AC
  Administered 2018-07-08: 2 mg via INTRAVENOUS
  Filled 2018-07-08: qty 1

## 2018-07-08 MED ORDER — SODIUM CHLORIDE 0.9 % IV SOLN
INTRAVENOUS | Status: DC | PRN
  Administered 2018-07-08: 19:00:00 via INTRAVENOUS

## 2018-07-08 MED ORDER — ALPRAZOLAM 0.25 MG PO TABS
0.2500 mg | ORAL_TABLET | Freq: Every evening | ORAL | Status: DC | PRN
  Administered 2018-07-09: 0.25 mg via ORAL
  Filled 2018-07-08: qty 1

## 2018-07-08 MED ORDER — MORPHINE SULFATE (PF) 4 MG/ML IV SOLN
4.0000 mg | Freq: Once | INTRAVENOUS | Status: AC
  Administered 2018-07-08: 4 mg via INTRAVENOUS
  Filled 2018-07-08: qty 1

## 2018-07-08 MED ORDER — FLEET ENEMA 7-19 GM/118ML RE ENEM: 1.0000 | ENEMA | Freq: Once | RECTAL | Status: DC | PRN

## 2018-07-08 MED ORDER — PROPOFOL 500 MG/50ML IV EMUL
INTRAVENOUS | Status: AC
  Filled 2018-07-08: qty 50

## 2018-07-08 MED ORDER — ETOMIDATE 2 MG/ML IV SOLN
INTRAVENOUS | Status: DC | PRN
  Administered 2018-07-08: 12 mg via INTRAVENOUS

## 2018-07-08 MED ORDER — MIDAZOLAM HCL 5 MG/5ML IJ SOLN
0.5000 mg | Freq: Once | INTRAMUSCULAR | Status: AC
  Administered 2018-07-08: 5 mg via INTRAVENOUS

## 2018-07-08 MED ORDER — ONDANSETRON HCL 4 MG/2ML IJ SOLN
4.0000 mg | INTRAMUSCULAR | Status: AC
  Administered 2018-07-08: 4 mg via INTRAVENOUS
  Filled 2018-07-08: qty 2

## 2018-07-08 MED ORDER — ASPIRIN 325 MG PO TABS
325.0000 mg | ORAL_TABLET | Freq: Every day | ORAL | Status: DC
  Administered 2018-07-09 – 2018-07-10 (×2): 325 mg via ORAL
  Filled 2018-07-08 (×3): qty 1

## 2018-07-08 MED ORDER — ROCURONIUM BROMIDE 100 MG/10ML IV SOLN
INTRAVENOUS | Status: DC | PRN
  Administered 2018-07-08: 20 mg via INTRAVENOUS
  Administered 2018-07-08: 5 mg via INTRAVENOUS
  Administered 2018-07-08: 20 mg via INTRAVENOUS

## 2018-07-08 MED ORDER — BUPIVACAINE LIPOSOME 1.3 % IJ SUSP
INTRAMUSCULAR | Status: AC
  Filled 2018-07-08: qty 20

## 2018-07-08 MED ORDER — LIDOCAINE HCL (CARDIAC) PF 100 MG/5ML IV SOSY
PREFILLED_SYRINGE | INTRAVENOUS | Status: DC | PRN
  Administered 2018-07-08: 60 mg via INTRAVENOUS

## 2018-07-08 MED ORDER — ONDANSETRON HCL 4 MG PO TABS: 4.0000 mg | ORAL_TABLET | Freq: Four times a day (QID) | ORAL | Status: DC | PRN

## 2018-07-08 MED ORDER — ROCURONIUM BROMIDE 50 MG/5ML IV SOLN
INTRAVENOUS | Status: AC
  Filled 2018-07-08: qty 1

## 2018-07-08 MED ORDER — SODIUM CHLORIDE 0.9 % IV SOLN
Freq: Once | INTRAVENOUS | Status: AC
  Administered 2018-07-08: 04:00:00 via INTRAVENOUS

## 2018-07-08 MED ORDER — ONDANSETRON HCL 4 MG/2ML IJ SOLN: 4.0000 mg | Freq: Once | INTRAMUSCULAR | Status: DC | PRN

## 2018-07-08 MED ORDER — TRAMADOL HCL 50 MG PO TABS: 50.0000 mg | ORAL_TABLET | Freq: Four times a day (QID) | ORAL | Status: DC | PRN

## 2018-07-08 MED ORDER — KETAMINE HCL 50 MG/ML IJ SOLN
INTRAMUSCULAR | Status: AC
  Filled 2018-07-08: qty 10

## 2018-07-08 MED ORDER — SUCCINYLCHOLINE CHLORIDE 20 MG/ML IJ SOLN
INTRAMUSCULAR | Status: DC | PRN
  Administered 2018-07-08: 80 mg via INTRAVENOUS

## 2018-07-08 MED ORDER — METOCLOPRAMIDE HCL 10 MG PO TABS: 5.0000 mg | ORAL_TABLET | Freq: Three times a day (TID) | ORAL | Status: DC | PRN

## 2018-07-08 MED ORDER — CEFAZOLIN SODIUM-DEXTROSE 1-4 GM/50ML-% IV SOLN
1.0000 g | Freq: Once | INTRAVENOUS | Status: AC
  Administered 2018-07-08: 1 g via INTRAVENOUS
  Filled 2018-07-08: qty 50

## 2018-07-08 MED ORDER — METOPROLOL SUCCINATE ER 25 MG PO TB24
25.0000 mg | ORAL_TABLET | Freq: Every day | ORAL | Status: DC
  Administered 2018-07-08 – 2018-07-10 (×3): 25 mg via ORAL
  Filled 2018-07-08 (×3): qty 1

## 2018-07-08 MED ORDER — FUROSEMIDE 20 MG PO TABS
20.0000 mg | ORAL_TABLET | ORAL | Status: DC
  Administered 2018-07-08 – 2018-07-10 (×2): 20 mg via ORAL
  Filled 2018-07-08 (×3): qty 1

## 2018-07-08 MED ORDER — EPHEDRINE SULFATE 50 MG/ML IJ SOLN
INTRAMUSCULAR | Status: AC
  Filled 2018-07-08: qty 1

## 2018-07-08 MED ORDER — ADULT MULTIVITAMIN W/MINERALS CH
1.0000 | ORAL_TABLET | Freq: Every day | ORAL | Status: DC
  Administered 2018-07-09 – 2018-07-10 (×2): 1 via ORAL
  Filled 2018-07-08 (×2): qty 1

## 2018-07-08 MED ORDER — ACETAMINOPHEN 500 MG PO TABS
1000.0000 mg | ORAL_TABLET | Freq: Three times a day (TID) | ORAL | Status: AC
  Administered 2018-07-08 – 2018-07-09 (×4): 1000 mg via ORAL
  Filled 2018-07-08 (×4): qty 2

## 2018-07-08 MED ORDER — MIDAZOLAM HCL 5 MG/5ML IJ SOLN
INTRAMUSCULAR | Status: AC
  Administered 2018-07-08: 5 mg via INTRAVENOUS
  Filled 2018-07-08: qty 5

## 2018-07-08 MED ORDER — METHOCARBAMOL 500 MG PO TABS
500.0000 mg | ORAL_TABLET | Freq: Four times a day (QID) | ORAL | Status: DC | PRN
  Administered 2018-07-10: 500 mg via ORAL
  Filled 2018-07-08: qty 1

## 2018-07-08 MED ORDER — OXYCODONE HCL 5 MG PO TABS
5.0000 mg | ORAL_TABLET | ORAL | Status: DC | PRN
  Administered 2018-07-10: 5 mg via ORAL

## 2018-07-08 MED ORDER — ONDANSETRON HCL 4 MG/2ML IJ SOLN
INTRAMUSCULAR | Status: AC
  Filled 2018-07-08: qty 2

## 2018-07-08 MED ORDER — BUSPIRONE HCL 10 MG PO TABS
5.0000 mg | ORAL_TABLET | Freq: Two times a day (BID) | ORAL | Status: DC
  Administered 2018-07-09 – 2018-07-10 (×3): 5 mg via ORAL
  Filled 2018-07-08 (×3): qty 1

## 2018-07-08 MED ORDER — BUPIVACAINE LIPOSOME 1.3 % IJ SUSP
INTRAMUSCULAR | Status: DC | PRN
  Administered 2018-07-08: 10 mL

## 2018-07-08 MED ORDER — ACETAMINOPHEN 325 MG PO TABS: 650.0000 mg | ORAL_TABLET | Freq: Four times a day (QID) | ORAL | Status: DC | PRN

## 2018-07-08 MED ORDER — BISACODYL 5 MG PO TBEC: 5.0000 mg | DELAYED_RELEASE_TABLET | Freq: Every day | ORAL | Status: DC | PRN

## 2018-07-08 SURGICAL SUPPLY — 41 items
BIT DRILL 4.9 CANNULATED (BIT) ×1
BIT DRILL CANN QC 4.9 LRG (BIT) IMPLANT
CANISTER SUCT 1200ML W/VALVE (MISCELLANEOUS) ×5 IMPLANT
CHLORAPREP W/TINT 26ML (MISCELLANEOUS) ×3 IMPLANT
DRAPE SURG 17X11 SM STRL (DRAPES) ×3 IMPLANT
DRAPE U-SHAPE 47X51 STRL (DRAPES) ×5 IMPLANT
DRILL BIT CANNULATED 4.9 (BIT) ×2
DRSG OPSITE POSTOP 3X4 (GAUZE/BANDAGES/DRESSINGS) ×2 IMPLANT
DRSG TEGADERM 6X8 (GAUZE/BANDAGES/DRESSINGS) ×3 IMPLANT
ELECT REM PT RETURN 9FT ADLT (ELECTROSURGICAL) ×3
ELECTRODE REM PT RTRN 9FT ADLT (ELECTROSURGICAL) ×1 IMPLANT
GAUZE PETRO XEROFOAM 1X8 (MISCELLANEOUS) ×3 IMPLANT
GAUZE SPONGE 4X4 12PLY STRL (GAUZE/BANDAGES/DRESSINGS) ×3 IMPLANT
GAUZE XEROFORM 4X4 STRL (GAUZE/BANDAGES/DRESSINGS) ×2 IMPLANT
GLOVE BIOGEL PI IND STRL 8 (GLOVE) ×1 IMPLANT
GLOVE BIOGEL PI INDICATOR 8 (GLOVE) ×2
GLOVE SURG SYN 7.5  E (GLOVE) ×4
GLOVE SURG SYN 7.5 E (GLOVE) ×2 IMPLANT
GLOVE SURG SYN 7.5 PF PI (GLOVE) ×2 IMPLANT
GOWN STRL REUS W/ TWL LRG LVL3 (GOWN DISPOSABLE) ×1 IMPLANT
GOWN STRL REUS W/ TWL XL LVL3 (GOWN DISPOSABLE) ×1 IMPLANT
GOWN STRL REUS W/TWL LRG LVL3 (GOWN DISPOSABLE) ×3
GOWN STRL REUS W/TWL XL LVL3 (GOWN DISPOSABLE) ×3
GUIDEWIRE ASNIS 3.2 NONCAL (WIRE) ×6 IMPLANT
KIT TURNOVER CYSTO (KITS) ×3 IMPLANT
NDL FILTER BLUNT 18X1 1/2 (NEEDLE) ×1 IMPLANT
NEEDLE FILTER BLUNT 18X 1/2SAF (NEEDLE) ×2
NEEDLE FILTER BLUNT 18X1 1/2 (NEEDLE) ×1 IMPLANT
NEEDLE HYPO 22GX1.5 SAFETY (NEEDLE) ×2 IMPLANT
NS IRRIG 500ML POUR BTL (IV SOLUTION) ×3 IMPLANT
PACK HIP COMPR (MISCELLANEOUS) ×3 IMPLANT
SCREW ASNIS 85MM (Screw) ×6 IMPLANT
SCREW CANN 6.5X80 NONSTRL (Screw) ×6 IMPLANT
STAPLER SKIN PROX 35W (STAPLE) ×3 IMPLANT
SUT VIC AB 0 CT1 36 (SUTURE) ×3 IMPLANT
SUT VIC AB 2-0 CT1 27 (SUTURE) ×3
SUT VIC AB 2-0 CT1 TAPERPNT 27 (SUTURE) ×1 IMPLANT
SYR 20CC LL (SYRINGE) ×2 IMPLANT
SYR 5ML LL (SYRINGE) ×3 IMPLANT
TAPE MICROFOAM 4IN (TAPE) ×3 IMPLANT
WASHER SCREW MATTA SS 13.0X1.5 (Washer) ×6 IMPLANT

## 2018-07-08 NOTE — Anesthesia Post-op Follow-up Note (Signed)
Anesthesia QCDR form completed.        

## 2018-07-08 NOTE — Transfer of Care (Signed)
Immediate Anesthesia Transfer of Care Note  Patient: Roni Bread  Procedure(s) Performed: CANNULATED HIP PINNING (Right Hip)  Patient Location: PACU  Anesthesia Type:General  Level of Consciousness: awake and patient cooperative  Airway & Oxygen Therapy: Patient Spontanous Breathing and Patient connected to face mask oxygen  Post-op Assessment: Report given to RN and Post -op Vital signs reviewed and stable  Post vital signs: Reviewed and stable  Last Vitals:  Vitals Value Taken Time  BP    Temp    Pulse 85 07/08/2018  8:31 PM  Resp 20 07/08/2018  8:31 PM  SpO2 94 % 07/08/2018  8:31 PM  Vitals shown include unvalidated device data.  Last Pain:  Vitals:   07/08/18 1602  TempSrc: Oral  PainSc:          Complications: No apparent anesthesia complications

## 2018-07-08 NOTE — ED Triage Notes (Signed)
Per EMS pt fell around 8pm 9/3 but denied transport at that time. Tonight while laying in bed she turned over and had extreme pain in her right hip/groin region. Pain 10/10 took 1 Vicodin at home

## 2018-07-08 NOTE — Consult Note (Signed)
[facility] Consultation Note  Patient ID: Michele [name], MRN: [id number], DOB/AGE: Apr 13, 1924 84 y.o. Admit date: 07/08/2018   Date of Consult: 07/08/2018 Primary Physician: Baxter Hire, MD Primary Cardiologist: Nehemiah Massed  Chief Complaint:  Chief Complaint  Patient presents with  . Fall  . Hip Pain   Reason for Consult: Myopathy  HPI: 84 y.o. female with history of mild to moderate dilated cardiomyopathy with consistent ejection fraction between 35 and 40% over the last many years.  Additionally she has had mild aortic valve stenosis which appears not to have clinically progressed over this past year.  She also has left bundle branch block and this is idiopathic in nature.  There is been no evidence of previous significant cardiovascular disease and/or myocardial infarction.  The patient has had appropriate medication management for her cardiomyopathy including beta-blocker ACE inhibitor and furosemide.  Physically she is been doing fine with minimal activity and mild shortness of breath but no evidence of exacerbation of heart failure.  She has not had any anginal symptoms at all.  The patient does have some peripheral vascular disease for which she has had a cerebrovascular accident in the remote past of which she is previously treated with statin therapy.  Currently just with.  The patient yesterday had a fall for which she has had a fracture.  Currently she is needing surgical intervention at this time for which she is at lowest risk possible for surgical intervention due to no evidence of significant new signs or symptoms of cardiovascular disease  Past Medical History:  Diagnosis Date  . LBBB (left bundle branch block)   . MI (myocardial infarction) ([place])   . Stroke [facility])       Surgical History:  Past Surgical History:  Procedure Laterality Date  . ABDOMINAL HYSTERECTOMY    . APPENDECTOMY    . ROTATOR CUFF REPAIR Left      Home Meds: Prior to Admission  medications   Medication Sig Start Date End Date Taking? Authorizing Provider  ALPRAZolam (XANAX) 0.25 MG tablet Take 1 tablet (0.25 mg total) by mouth at bedtime as needed for anxiety. 11/11/17  Yes Max Sane, MD  aspirin 325 MG tablet Take 325 mg by mouth daily.   Yes [provider]  busPIRone (BUSPAR) 5 MG tablet Take 5 mg by mouth 2 (two) times daily.   Yes [provider]  diclofenac sodium (VOLTAREN) 1 % GEL Apply 4 g topically 4 (four) times daily as needed (pain).   Yes [provider]  escitalopram (LEXAPRO) 5 MG tablet Take 5 mg by mouth daily.   Yes [provider]  furosemide (LASIX) 20 MG tablet Take 20 mg by mouth every other day.   Yes [provider]  HYDROcodone-acetaminophen (NORCO/VICODIN) 5-325 MG tablet Take  to 1 tablet by mouth 3 times daily as needed for severe pain   Yes [provider]  metoprolol succinate (TOPROL-XL) 25 MG 24 hr tablet Take 25 mg by mouth daily.   Yes [provider]  potassium chloride (K-DUR) 10 MEQ tablet Take 10 mEq by mouth daily. 06/03/18  Yes [provider]  ramipril (ALTACE) 10 MG capsule Take 10 mg by mouth 2 (two) times daily.   Yes [provider]    Inpatient Medications:  . aspirin  325 mg Oral Daily  . busPIRone  5 mg Oral BID  . escitalopram  5 mg Oral Daily  . furosemide  20 mg Oral QODAY  .  metoprolol succinate  25 mg Oral Daily  . ramipril  10 mg Oral BID     Allergies:  Allergies  Allergen Reactions  . Penicillins Rash    Has patient had a PCN reaction causing immediate rash, facial/tongue/throat swelling, SOB or lightheadedness with hypotension: Yes Has patient had a PCN reaction causing severe rash involving mucus membranes or skin necrosis: No Has patient had a PCN reaction that required hospitalization: No Has patient had a PCN reaction occurring within the last 10 years: No If all of the above answers are "NO", then may proceed with  Cephalosporin use.    Social History   Socioeconomic History  . Marital status: Widowed    Spouse name: Not on file  . Number of children: Not on file  . Years of education: Not on file  . Highest education level: Not on file  Occupational History  . Not on file  Social Needs  . Financial resource strain: Not on file  . Food insecurity:    Worry: Not on file    Inability: Not on file  . Transportation needs:    Medical: Not on file    Non-medical: Not on file  Tobacco Use  . Smoking status: Never Smoker  . Smokeless tobacco: Never Used  Substance and Sexual Activity  . Alcohol use: No    Frequency: Never  . Drug use: Not on file  . Sexual activity: Not on file  Lifestyle  . Physical activity:    Days per week: Not on file    Minutes per session: Not on file  . Stress: Not on file  Relationships  . Social connections:    Talks on phone: Not on file    Gets together: Not on file    Attends religious service: Not on file    Active member of club or organization: Not on file    Attends meetings of clubs or organizations: Not on file    Relationship status: Not on file  . Intimate partner violence:    Fear of current or ex partner: Not on file    Emotionally abused: Not on file    Physically abused: Not on file    Forced sexual activity: Not on file  Other Topics Concern  . Not on file  Social History Narrative  . Not on file     History reviewed. No pertinent family history.   Review of Systems Positive for limb pain Negative for: General:  chills, fever, night sweats or weight changes.  Cardiovascular: PND orthopnea syncope dizziness  Dermatological skin lesions rashes Respiratory: Cough congestion Urologic: Frequent urination urination at night and hematuria Abdominal: negative for nausea, vomiting, diarrhea, bright red blood per rectum, melena, or hematemesis Neurologic: negative for visual changes, and/or hearing changes  All other systems reviewed and  are otherwise negative except as noted above.  Labs: No results for input(s): CKTOTAL, CKMB, TROPONINI in the last 72 hours. Lab Results  Component Value Date   WBC 13.1 (H) 07/08/2018   HGB 11.8 (L) 07/08/2018   HCT 34.7 (L) 07/08/2018   MCV 87.2 07/08/2018   PLT 254 07/08/2018    Recent Labs  Lab 07/08/18 0259  NA 136  K 3.9  CL 105  CO2 25  BUN 13  CREATININE 1.01*  CALCIUM 8.9  GLUCOSE 112*   No results found for: CHOL, HDL, LDLCALC, TRIG No results found for: DDIMER  Radiology/Studies:  Ct Pelvis Wo Contrast  Result Date: 07/08/2018 CLINICAL DATA:  Right hip and groin pain after a fall. EXAM: CT PELVIS WITHOUT CONTRAST TECHNIQUE: Multidetector CT imaging of the pelvis was performed following the standard protocol without intravenous contrast. COMPARISON:  Right hip radiographs 07/08/2018. CT abdomen and pelvis 12/19/2008 FINDINGS: Urinary Tract:  No abnormality visualized. Bowel:  Unremarkable visualized pelvic bowel loops. Vascular/Lymphatic: Aorto iliac calcifications. No significant lymphadenopathy. Reproductive: Uterus is surgically absent. Asymmetric enlargement of right ovary, measuring 4.6 cm maximal diameter. Left ovary is unremarkable. Other:  No free air or free fluid in the pelvis. Musculoskeletal: There is an impacted subcapital fracture of the proximal right femur. Pelvis and sacrum appear otherwise intact. Degenerative changes in the lower lumbar spine and hips. Small right hip effusion. IMPRESSION: Impacted subcapital fracture of the proximal right femur. No other fractures identified. Electronically Signed   By: Lucienne Capers M.D.   On: 07/08/2018 03:33   Dg Chest Portable 1 View  Result Date: 07/08/2018 CLINICAL DATA:  Preoperative EXAM: PORTABLE CHEST 1 VIEW COMPARISON:  05/09/2018 FINDINGS: Borderline heart size with normal pulmonary vascularity. Diffuse coarse interstitial pattern to the lungs likely representing fibrosis. No airspace disease or  consolidation in the lungs. No blunting of costophrenic angles. No pneumothorax. Mediastinal contours appear intact. Calcification of the aorta. Degenerative changes in the spine and shoulders. IMPRESSION: Chronic interstitial fibrosis in the lungs. No evidence of active pulmonary disease. Aortic atherosclerosis. Electronically Signed   By: Lucienne Capers M.D.   On: 07/08/2018 04:06   Dg Hip Unilat  With Pelvis 2-3 Views Right  Result Date: 07/08/2018 CLINICAL DATA:  Extreme pain and right hip and groin region after a fall on 09/03 at 8 p.m. EXAM: DG HIP (WITH OR WITHOUT PELVIS) 2-3V RIGHT COMPARISON:  None. FINDINGS: Degenerative changes in the lower lumbar spine and hips. Pelvis appears intact. SI joints and symphysis pubis are not displaced. Evaluation of right hip is limited due to rotation. There appears to be slight deformity of the subcapital femoral neck suggesting a possible impacted subcapital fracture. No displacement or dislocation. IMPRESSION: Degenerative changes in the lower lumbar spine and hips. Possible impacted subcapital fracture of the right femoral neck. No displacement or dislocation. Electronically Signed   By: Lucienne Capers M.D.   On: 07/08/2018 02:39    EKG: Sinus rhythm with left bundle branch block  Weights: Filed Weights   07/08/18 0112 07/08/18 0459  Weight: 58.1 kg 58.8 kg     Physical Exam: Blood pressure (!) 141/68, pulse 71, temperature 97.8 F (36.6 C), temperature source Oral, resp. rate 16, height 5\' 2"  (1.575 m), weight 58.8 kg, SpO2 96 %. Body mass index is 23.71 kg/m. General: Well developed, well nourished, in no acute distress. Head eyes ears nose throat: Normocephalic, atraumatic, sclera non-icteric, no xanthomas, nares are without discharge. No apparent thyromegaly and/or mass  Lungs: Normal respiratory effort.  no wheezes, no rales, no rhonchi.  Heart: RRR with normal S1 S2.  2 to 3+ left sternal and right upper sternal border murmur gallop, no  rub, PMI is normal size and placement, carotid upstroke normal with  bruit, jugular venous pressure is normal Abdomen: Soft, non-tender, non-distended with normoactive bowel sounds. No hepatomegaly. No rebound/guarding. No obvious abdominal masses. Abdominal aorta is normal size without bruit Extremities: Trace edema. no cyanosis, no clubbing, no ulcers  Peripheral : 2+ bilateral upper extremity pulses, 2+ bilateral femoral pulses, 2+ bilateral dorsal pedal pulse Neuro: Alert and oriented. No facial asymmetry. No focal deficit. Moves all extremities spontaneously. Musculoskeletal: Normal muscle tone without  kyphosis Psych:  Responds to questions appropriately with a normal affect.    Assessment: 84 year old female with mild to moderate dilated cardiomyopathy mild aortic valve stenosis with long-standing left bundle branch block and no current evidence of angina myocardial infarction or congestive heart failure at lowest risk possible on appropriate medication management for orthopedic surgery  Plan: 1.  Proceed to orthopedic surgery without delay or restrictions due to need in treatment of fracture.  Person is at lowest risk possible at this time 2.  Continuation of medication management for cardiomyopathy to reduce possibility of congestive heart failure 3.  Lasix as needed post surgery for any fluid retention in the lower extremities and her pulmonary edema 4.  No further cardiac diagnostics necessary at this time 5.  Further treatment options after above  Signed, Corey Skains M.D. [facility] 07/08/2018, 1:03 PM

## 2018-07-08 NOTE — ED Notes (Signed)
This EDT and Beth, EDT inserted foley catheter.

## 2018-07-08 NOTE — Clinical Social Work Note (Signed)
Clinical Social Work Assessment  Patient Details  Name: Michele [name] MRN: [id number] Date of Birth: [birth year]  Date of referral:  07/08/18               Reason for consult:  Facility Placement                Permission sought to share information with:  Chartered certified accountant granted to share information::  Yes, Verbal Permission Granted  Name::      [place]::   [place]   Relationship::     Contact Information:     Housing/Transportation Living arrangements for the past 2 months:  [place] of Information:  Patient, Friend/Neighbor Patient Interpreter Needed:  None Criminal Activity/Legal Involvement Pertinent to Current Situation/Hospitalization:  No - Comment as needed Significant Relationships:  Adult Children, Friend Lives with:  Self Do you feel safe going back to the place where you live?  Yes Need for family participation in patient care:  Yes (Comment)  Care giving concerns:  Patient lives in [place] independent living and has 24/7 care.     Social Worker assessment / plan:  Holiday representative ([place]) reviewed chart and noted that patient has a hip fracture and surgery and PT are pending. CSW met with patient and her friend/caregiver Michele [name] was at bedside. Patient was alert and oriented X4 and was laying in the bed. CSW introduced self and explained role of CSW department. Per patient she lives at [facility] and has 24/7 caregivers. CSW explained that after surgery PT will evaluate patient and make a recommendation of home health or SNF. Patient is agreeable to SNF if needed. CSW explained that [facility] will have to approve SNF. Patient verbalized her understanding. FL2 complete and faxed out. CSW will continue to follow and assist as needed.   Employment status:  Retired Nurse, adult PT Recommendations:  Not assessed at this time Information / Referral to community  resources:  [place]  Patient/Family's Response to care:  Patient is agreeable to SNF if needed.   Patient/Family's Understanding of and Emotional Response to Diagnosis, Current Treatment, and Prognosis:  Patient was very pleasant and thanked CSW for assistance.   Emotional Assessment Appearance:  Appears stated age Attitude/Demeanor/Rapport:    Affect (typically observed):  Accepting, Adaptable, Pleasant Orientation:  Oriented to Self, Oriented to Place, Oriented to  Time, Oriented to Situation Alcohol / Substance use:  Not Applicable Psych involvement (Current and /or in the community):  No (Comment)  Discharge Needs  Concerns to be addressed:  Discharge Planning Concerns Readmission within the last 30 days:  No Current discharge risk:  Dependent with Mobility Barriers to Discharge:  Continued Medical Work up   [employer], Veronia Beets, LCSW 07/08/2018, 2:55 PM

## 2018-07-08 NOTE — Anesthesia Procedure Notes (Signed)
Procedure Name: Intubation Date/Time: 07/08/2018 6:47 PM Performed by: Johnna Acosta, CRNA Pre-anesthesia Checklist: Patient identified, Emergency Drugs available, Suction available, Patient being monitored and Timeout performed Patient Re-evaluated:Patient Re-evaluated prior to induction Oxygen Delivery Method: Circle system utilized Preoxygenation: Pre-oxygenation with 100% oxygen Induction Type: IV induction Ventilation: Mask ventilation without difficulty Laryngoscope Size: Miller and 2 Grade View: Grade I Tube type: Oral Tube size: 7.0 mm Number of attempts: 1 Airway Equipment and Method: Stylet Placement Confirmation: ETT inserted through vocal cords under direct vision,  positive ETCO2 and breath sounds checked- equal and bilateral Secured at: 20 cm Tube secured with: Tape Dental Injury: Teeth and Oropharynx as per pre-operative assessment

## 2018-07-08 NOTE — ED Notes (Signed)
Patient transported to room 150 by this EDT.

## 2018-07-08 NOTE — H&P (Signed)
H&P reviewed. No significant changes noted.  

## 2018-07-08 NOTE — Clinical Social Work Placement (Signed)
   CLINICAL SOCIAL WORK PLACEMENT  NOTE  Date:  07/08/2018  Patient Details  Name: COSTELLA SCHWARZ MRN: [id number] Date of Birth: 1924-06-19  Clinical Social Work is seeking post-discharge placement for this patient at the [place] level of care (*CSW will initial, date and re-position this form in  chart as items are completed):  Yes   Patient/family provided with [place] Work Department's list of facilities offering this level of care within the geographic area requested by the patient (or if unable, by the patient's family).  Yes   Patient/family informed of their freedom to choose among providers that offer the needed level of care, that participate in Medicare, Medicaid or managed care program needed by the patient, have an available bed and are willing to accept the patient.  Yes   Patient/family informed of [place]'s ownership interest in [facility] and [facility], as well as of the fact that they are under no obligation to receive care at these facilities.  PASRR submitted to EDS on 07/08/18     PASRR number received on 07/08/18     Existing PASRR number confirmed on       FL2 transmitted to all facilities in geographic area requested by pt/family on 07/08/18     FL2 transmitted to all facilities within larger geographic area on       Patient informed that his/her managed care company has contracts with or will negotiate with certain facilities, including the following:            Patient/family informed of bed offers received.  Patient chooses bed at       Physician recommends and patient chooses bed at      Patient to be transferred to   on  .  Patient to be transferred to facility by       Patient family notified on   of transfer.  Name of family member notified:        PHYSICIAN       Additional Comment:    _______________________________________________ Lindley Stachnik, Veronia Beets, LCSW 07/08/2018, 2:53 PM

## 2018-07-08 NOTE — H&P (Signed)
[facility] [place] NAME: Michele [name]    MR#:  [id number]  DATE OF BIRTH:  16-May-1924  DATE OF ADMISSION:  07/08/2018  PRIMARY CARE PHYSICIAN: Baxter Hire, MD   REQUESTING/REFERRING PHYSICIAN: Hinda Kehr, MD  CHIEF COMPLAINT:   Chief Complaint  Patient presents with  . Fall  . Hip Pain    HISTORY OF PRESENT ILLNESS:  Michele [name]  is a 84 y.o. female with a known history of MI (1993), CVA (2012), HTN p/w 1d Hx mechanical fall + R hip/buttock pain. Pt is AAOx3, but has some mild memory loss. Her narrative is not entirely accurate, and is corrected by her caretaker, who is at bedside. Pt is reportedly active and reasonably independent at baseline. She resides at [facility]. @~2000PM on 07/07/2018 evening, pt went to the bathroom, and then experienced a fall as she was coming out of the bathroom. She fell towards her R side, and hit her R buttock on the ground. She did not hit her head. She states that she did not have pain in the hip/buttock initially. She got up and was able to ambulate w/o difficulty. She only developed pain later in the night, ~2hrs later. She states she was lying in bed, turned over and began to experience pain in the R hip/groin/buttock. She endorses continued pain at the time of my assessment, but does not appear to be in distress. Orthopedic Surgery (Dr. Posey Pronto) contacted by Dr. Karma Greaser (ED), to consult. Pt has a cardiac Hx, and follows w/ Dr. Nehemiah Massed. 06/30/2017 Echo (+) EF 35-40%, mod LVH, mod AR, mild MR + TR + PR.  PAST MEDICAL HISTORY:   Past Medical History:  Diagnosis Date  . MI (myocardial infarction) ([place])   . Stroke [facility])     PAST SURGICAL HISTORY:   Past Surgical History:  Procedure Laterality Date  . ABDOMINAL HYSTERECTOMY    . APPENDECTOMY    . ROTATOR CUFF REPAIR Left     SOCIAL HISTORY:   Social History   Tobacco Use  . Smoking status: Never Smoker  . Smokeless  tobacco: Never Used  Substance Use Topics  . Alcohol use: No    Frequency: Never    FAMILY HISTORY:  History reviewed. No pertinent family history.  DRUG ALLERGIES:   Allergies  Allergen Reactions  . Penicillins Rash    Has patient had a PCN reaction causing immediate rash, facial/tongue/throat swelling, SOB or lightheadedness with hypotension: Yes Has patient had a PCN reaction causing severe rash involving mucus membranes or skin necrosis: No Has patient had a PCN reaction that required hospitalization: No Has patient had a PCN reaction occurring within the last 10 years: No If all of the above answers are "NO", then may proceed with Cephalosporin use.    REVIEW OF SYSTEMS:   Review of Systems  Constitutional: Negative for chills, diaphoresis, fever, malaise/fatigue and weight loss.  HENT: Negative for congestion, ear pain, hearing loss, nosebleeds, sinus pain, sore throat and tinnitus.   Eyes: Negative for blurred vision, double vision and photophobia.  Respiratory: Negative for cough, hemoptysis, sputum production, shortness of breath and wheezing.   Cardiovascular: Negative for chest pain, palpitations, orthopnea, claudication, leg swelling and PND.  Gastrointestinal: Negative for abdominal pain, blood in stool, constipation, diarrhea, heartburn, melena, nausea and vomiting.  Genitourinary: Negative for dysuria, frequency, hematuria and urgency.  Musculoskeletal: Positive for falls and joint pain. Negative for back pain, myalgias and neck pain.  Skin: Negative for itching and rash.  Neurological: Negative for dizziness, tingling, tremors, sensory change, speech change, focal weakness, seizures, loss of consciousness, weakness and headaches.  Psychiatric/Behavioral: Negative for memory loss. The patient does not have insomnia.    MEDICATIONS AT HOME:   Prior to Admission medications   Medication Sig Start Date End Date Taking? Authorizing Provider  ALPRAZolam (XANAX) 0.25  MG tablet Take 1 tablet (0.25 mg total) by mouth at bedtime as needed for anxiety. 11/11/17  Yes Max Sane, MD  aspirin 325 MG tablet Take 325 mg by mouth daily.   Yes [provider]  busPIRone (BUSPAR) 5 MG tablet Take 5 mg by mouth 2 (two) times daily.   Yes [provider]  diclofenac sodium (VOLTAREN) 1 % GEL Apply 4 g topically 4 (four) times daily as needed (pain).   Yes [provider]  escitalopram (LEXAPRO) 5 MG tablet Take 5 mg by mouth daily.   Yes [provider]  furosemide (LASIX) 20 MG tablet Take 20 mg by mouth every other day.   Yes [provider]  HYDROcodone-acetaminophen (NORCO/VICODIN) 5-325 MG tablet Take  to 1 tablet by mouth 3 times daily as needed for severe pain   Yes [provider]  metoprolol succinate (TOPROL-XL) 25 MG 24 hr tablet Take 25 mg by mouth daily.   Yes [provider]  potassium chloride (K-DUR) 10 MEQ tablet Take 10 mEq by mouth daily. 06/03/18  Yes [provider]  ramipril (ALTACE) 10 MG capsule Take 10 mg by mouth 2 (two) times daily.   Yes [provider]      VITAL SIGNS:  Blood pressure 124/64, pulse 71, temperature 98.6 F (37 C), temperature source Oral, resp. rate 17, height 5\' 2"  (1.575 m), weight 58.1 kg, SpO2 100 %.  PHYSICAL EXAMINATION:  Physical Exam  Constitutional: She is oriented to person, place, and time. She appears well-developed and well-nourished. She is active and cooperative.  Non-toxic appearance. She does not have a sickly appearance. She does not appear ill. No distress. She is not intubated. Nasal cannula in place.  HENT:  Head: Normocephalic.  Mouth/Throat: Oropharynx is clear and moist. No oropharyngeal exudate.  Eyes: Conjunctivae, EOM and lids are normal. No scleral icterus.  Neck: Normal range of motion. No JVD present. No thyromegaly present.  Cardiovascular: Normal rate, regular rhythm, S1 normal and S2 normal.  No extrasystoles  are present. Exam reveals no gallop, no S3, no S4, no distant heart sounds and no friction rub.  Murmur heard.  Systolic murmur is present with a grade of 3/6. Pulmonary/Chest: Effort normal and breath sounds normal. No accessory muscle usage or stridor. No apnea, no tachypnea and no bradypnea. She is not intubated. No respiratory distress. She has no decreased breath sounds. She has no wheezes. She has no rhonchi. She has no rales.  Abdominal: Soft. She exhibits no distension. Bowel sounds are decreased. There is no tenderness. There is no rigidity, no rebound and no guarding.  Musculoskeletal: She exhibits tenderness. She exhibits no edema.  Lymphadenopathy:    She has no cervical adenopathy.  Neurological: She is alert and oriented to person, place, and time. She is not disoriented.  (+) mild memory loss.  Skin: Skin is warm and dry. No rash noted. She is not diaphoretic. No erythema.  Psychiatric: She has a normal mood and affect. Her speech is normal and behavior is normal. Judgment and thought content normal. She exhibits abnormal recent memory. She exhibits  normal remote memory.   RLE shortened + externally rotated. LABORATORY PANEL:   CBC Recent Labs  Lab 07/08/18 0259  WBC 13.1*  HGB 11.8*  HCT 34.7*  PLT 254   ------------------------------------------------------------------------------------------------------------------  Chemistries  Recent Labs  Lab 07/08/18 0259  NA 136  K 3.9  CL 105  CO2 25  GLUCOSE 112*  BUN 13  CREATININE 1.01*  CALCIUM 8.9   ------------------------------------------------------------------------------------------------------------------  Cardiac Enzymes No results for input(s): TROPONINI in the last 168 hours. ------------------------------------------------------------------------------------------------------------------  RADIOLOGY:  Ct Pelvis Wo Contrast  Result Date: 07/08/2018 CLINICAL DATA:  Right hip and groin pain after a  fall. EXAM: CT PELVIS WITHOUT CONTRAST TECHNIQUE: Multidetector CT imaging of the pelvis was performed following the standard protocol without intravenous contrast. COMPARISON:  Right hip radiographs 07/08/2018. CT abdomen and pelvis 12/19/2008 FINDINGS: Urinary Tract:  No abnormality visualized. Bowel:  Unremarkable visualized pelvic bowel loops. Vascular/Lymphatic: Aorto iliac calcifications. No significant lymphadenopathy. Reproductive: Uterus is surgically absent. Asymmetric enlargement of right ovary, measuring 4.6 cm maximal diameter. Left ovary is unremarkable. Other:  No free air or free fluid in the pelvis. Musculoskeletal: There is an impacted subcapital fracture of the proximal right femur. Pelvis and sacrum appear otherwise intact. Degenerative changes in the lower lumbar spine and hips. Small right hip effusion. IMPRESSION: Impacted subcapital fracture of the proximal right femur. No other fractures identified. Electronically Signed   By: Lucienne Capers M.D.   On: 07/08/2018 03:33   Dg Chest Portable 1 View  Result Date: 07/08/2018 CLINICAL DATA:  Preoperative EXAM: PORTABLE CHEST 1 VIEW COMPARISON:  05/09/2018 FINDINGS: Borderline heart size with normal pulmonary vascularity. Diffuse coarse interstitial pattern to the lungs likely representing fibrosis. No airspace disease or consolidation in the lungs. No blunting of costophrenic angles. No pneumothorax. Mediastinal contours appear intact. Calcification of the aorta. Degenerative changes in the spine and shoulders. IMPRESSION: Chronic interstitial fibrosis in the lungs. No evidence of active pulmonary disease. Aortic atherosclerosis. Electronically Signed   By: Lucienne Capers M.D.   On: 07/08/2018 04:06   Dg Hip Unilat  With Pelvis 2-3 Views Right  Result Date: 07/08/2018 CLINICAL DATA:  Extreme pain and right hip and groin region after a fall on 09/03 at 8 p.m. EXAM: DG HIP (WITH OR WITHOUT PELVIS) 2-3V RIGHT COMPARISON:  None. FINDINGS:  Degenerative changes in the lower lumbar spine and hips. Pelvis appears intact. SI joints and symphysis pubis are not displaced. Evaluation of right hip is limited due to rotation. There appears to be slight deformity of the subcapital femoral neck suggesting a possible impacted subcapital fracture. No displacement or dislocation. IMPRESSION: Degenerative changes in the lower lumbar spine and hips. Possible impacted subcapital fracture of the right femoral neck. No displacement or dislocation. Electronically Signed   By: Lucienne Capers M.D.   On: 07/08/2018 02:39   IMPRESSION AND PLAN:   A/P: 1F mechanical fall + impacted subcapital R femoral neck fracture. Hyperglycemia, leukocytosis, normocytic anemia. -Fall, hip Fx: Pt p/w 1d Hx mechanical fall, CT (+) impacted subcapital fracture of the right femoral neck, w/o displacement or dislocation. Dr. Posey Pronto (Orthopedic Surgery) contacted from ED by Dr. Karma Greaser, to consult. NWB, pain ctrl. Foley catheter. DVT PPx. -Preoperative cardiac clearance: Pt endorses Hx MI in 1993. She also endorses Hx CVA in 2012, w/ residual vision impairment L eye. She is of advanced age, and has additional Hx of HTN and chronic systolic CHF (w/ EF 02-72%, mod LVH, mod AR, mild MR + TR + PR as  of 06/30/2017 Echo). She has a III/VI systolic murmur on exam. EKG demonstrates LVH, LBBB. She is followed by Dr. Nehemiah Massed as outpt. On a recent admission (11/2017) for pneumonia, pt had Troponin leak, attributed to demand ischemia. Based on all of this information, I believe (in a professional capacity) that the pt is, at minimum, at a moderate level of cardiac risk for a low to moderate risk noncardiovascular surgical procedure. Even despite pt being active in her life, I believe her condition may warrant additional preoperative medical optimization through cardiac evaluation/diagnostics. Out of deference to pt's Cardiologist however, will obtain Cardiology consultation w/ pt's  group. -Hyperglycemia, leukocytosis: Likely reactive, 2/2 Fx, pain, stress. -Normocytic anemia: Likely 2/2 anemia of chronic disease. No evidence of acute blood loss at present time. -c/w home meds. -FEN/GI: NPO. -DVT PPx: Lovenox. -Code status: Full code. -Disposition: Admission, > 2 midnights.  All the records are reviewed and case discussed with ED provider. Management plans discussed with the patient, family and they are in agreement.  CODE STATUS: Full code.  TOTAL TIME TAKING CARE OF THIS PATIENT: 75 minutes.    Arta Silence M.D on 07/08/2018 at 4:44 AM  Between 7am to 6pm - Pager - [phone]  After 6pm go to [email or web address] - Proofreader  [facility]  Office  [phone]  CC: Primary care physician; Baxter Hire, MD   Note: This dictation was prepared with Dragon dictation along with smaller phrase technology. Any transcriptional errors that result from this process are unintentional.

## 2018-07-08 NOTE — Anesthesia Preprocedure Evaluation (Signed)
Anesthesia Evaluation  Patient identified by MRN, date of birth, ID band Patient awake    Reviewed: Allergy & Precautions, NPO status , Patient's Chart, lab work & pertinent test results  Airway Mallampati: III       Dental   Pulmonary pneumonia, resolved,    Pulmonary exam normal        Cardiovascular + Past MI  Normal cardiovascular exam+ Valvular Problems/Murmurs AS      Neuro/Psych CVA negative psych ROS   GI/Hepatic negative GI ROS, Neg liver ROS,   Endo/Other  negative endocrine ROS  Renal/GU negative Renal ROS     Musculoskeletal negative musculoskeletal ROS (+)   Abdominal Normal abdominal exam  (+)   Peds  Hematology negative hematology ROS (+)   Anesthesia Other Findings Past Medical History: No date: LBBB (left bundle branch block) No date: MI (myocardial infarction) ([place]) No date: Stroke ([place])  Mild AS  Reproductive/Obstetrics negative OB ROS                             Anesthesia Physical Anesthesia Plan  ASA: III  Anesthesia Plan: General   Post-op Pain Management:    Induction: Intravenous  PONV Risk Score and Plan:   Airway Management Planned: Oral ETT  Additional Equipment:   Intra-op Plan:   Post-operative Plan: Extubation in OR  Informed Consent: I have reviewed the patients History and Physical, chart, labs and discussed the procedure including the risks, benefits and alternatives for the proposed anesthesia with the patient or authorized representative who has indicated his/her understanding and acceptance.   Dental advisory given  Plan Discussed with: CRNA and Surgeon  Anesthesia Plan Comments:         Anesthesia Quick Evaluation

## 2018-07-08 NOTE — Op Note (Signed)
DATE OF SURGERY: 07/08/2018  PREOPERATIVE DIAGNOSIS: Right valgus impacted femoral neck fracture  POSTOPERATIVE DIAGNOSIS: Right valgus impacted femoral neck fracture  PROCEDURE: Percutaneous pinning of Right femoral neck fracture  SURGEON: Cato Mulligan, MD  ASSISTANTS: none  EBL: 100 cc  COMPONENTS:  Stryker 6.24mm cannulated screws x 3 with washers (43mm, 73mm, 74mm)   INDICATIONS: Michele [name] is a 84 y.o. female who sustained a valgus impacted femoral neck fracture after a fall. Risks and benefits of percutaneous pinning were explained to the patient and family (daughter over the phone). Risks include but are not limited to bleeding, infection, injury to tissues, nerves, vessels, DVT/PE, malunion/nonunion, avascular necrosis, hardware failure, and risks of anesthesia. The patient and family understand these risks, have completed an informed consent, and wish to proceed.   PROCEDURE:  The patient was brought into the operating room. After administering general anesthesia, the patient was placed in the supine position on the Hana table. The uninjured leg was extended while the injured lower extremity was placed in a neutral position with care taken to not displace the fracture during positioning. The lateral aspects of the operative hip and thigh were prepped with ChloraPrep solution before being draped sterilely. IV antibiotics were administered. A timeout was performed to verify the appropriate surgical site, patient, and procedure.   The greater trochanter was identified and an approximately 5 cm incision was made over the lateral aspect of the proximal femur. The incision was carried down through the subcutaneous tissues to expose the IT band. This was split at the proximal portion of the incision and the vastus lateralis was split in line with its fibers. The lateral aspect of the femur was cleared of soft tissue. Under fluoroscopic guidance, a guidewire was placed along the inferior  aspect of the femoral neck into the head while ensuring the start point on the lateral cortex was not below the level of the lesser trochanter. A parallel guide was used to place two additional guidepins (superior posterior and superior anterior). Position of all pins was verified fluoroscopically in both the AP and lateral views. A measuring device was used the measure appropriate screw length. The guidepins were drilled with a 3.56mm drill. Appropriately sized screws were advanced starting with the inferior screw, then superior posterior, then superior anterior. Screws were sequentially tightened. Hardware position and bony alignment was confirmed fluoroscopically with AP and lateral views.   The wounds were irrigated thoroughly with sterile saline solution. The IT band was closed with 0-Vicryl. Deep fat sutures were also placed with 0-Vicryl. The subcutaneous tissues were closed using 2-0 Vicryl interrupted sutures. The skin was closed using staples. Sterile occlusive dressing was applied. The patient was then transferred to the recovery room in satisfactory condition after tolerating the procedure well.  POSTOPERATIVE PLAN: The patient will be 50%WB on the operative extremity. Lovenox 40mg /day x 4 weeks to start on POD#1. Ancef x 24 hours. PT/OT on POD#1.

## 2018-07-08 NOTE — Progress Notes (Signed)
Patient ID: Michele [name], female   DOB: 05-04-1924, 84 y.o.   MRN: [id number]  Pt seen and examined. Complains of right hip pain. She is scheduled for surgery around 3 PM. Family in the room. Patient seen by cardiology Dr. Nehemiah Massed. Surgical clearance obtained from cardiology. She denies any chest pain or shortness of breath.

## 2018-07-08 NOTE — NC FL2 (Signed)
[name] LEVEL OF CARE SCREENING TOOL     IDENTIFICATION  Patient Name: Michele [name] Birthdate: 27-Jul-1924 Sex: female Admission Date (Current Location): 07/08/2018  [place] and [place] Number:  Engineering geologist and Address:  [facility], [address], [place], [place] [address]      Provider Number: [id number]  Attending Physician Name and Address:  Fritzi Mandes, MD  Relative Name and Phone Number:       Current Level of Care: Hospital Recommended Level of Care: [place] Prior Approval Number:    Date Approved/Denied:   PASRR Number: [id number])  Discharge Plan: SNF    Current Diagnoses: Patient Active Problem List   Diagnosis Date Noted  . Closed subcapital fracture of neck of right femur, initial encounter ([place]) 07/08/2018  . Pneumonia 11/06/2017    Orientation RESPIRATION BLADDER Height & Weight     Self, Time, Situation, Place  Normal Continent Weight: 129 lb 10.1 oz (58.8 kg) Height:  5\' 2"  (157.5 cm)  BEHAVIORAL SYMPTOMS/MOOD NEUROLOGICAL BOWEL NUTRITION STATUS      Continent Diet(Diet: NPO for surgery to be advanced. )  AMBULATORY STATUS COMMUNICATION OF NEEDS Skin   Extensive Assist Verbally Surgical wounds                       Personal Care Assistance Level of Assistance  Bathing, Feeding, Dressing Bathing Assistance: Limited assistance Feeding assistance: Independent Dressing Assistance: Limited assistance     Functional Limitations Info  Sight, Hearing, Speech Sight Info: Adequate Hearing Info: Adequate Speech Info: Adequate    SPECIAL CARE FACTORS FREQUENCY  PT (By licensed PT), OT (By licensed OT)     PT Frequency: (5) OT Frequency: (5)            Contractures      Additional Factors Info  Code Status, Allergies Code Status Info: (Full Code. ) Allergies Info: (Penicillins. )           Current Medications (07/08/2018):  This is the current hospital active  medication list Current Facility-Administered Medications  Medication Dose Route Frequency Provider Last Rate Last Dose  . acetaminophen (TYLENOL) tablet 650 mg  650 mg Oral Q6H PRN Arta Silence, MD      . ALPRAZolam Duanne Moron) tablet 0.25 mg  0.25 mg Oral QHS PRN Arta Silence, MD      . aspirin tablet 325 mg  325 mg Oral Daily Arta Silence, MD      . bisacodyl (DULCOLAX) EC tablet 5 mg  5 mg Oral Daily PRN Arta Silence, MD      . busPIRone (BUSPAR) tablet 5 mg  5 mg Oral BID Arta Silence, MD      . escitalopram (LEXAPRO) tablet 5 mg  5 mg Oral Daily Sridharan, Prasanna, MD      . furosemide (LASIX) tablet 20 mg  20 mg Oral Henreitta Leber, Prasanna, MD   20 mg at 07/08/18 0900  . metoprolol succinate (TOPROL-XL) 24 hr tablet 25 mg  25 mg Oral Daily Arta Silence, MD   25 mg at 07/08/18 0859  . morphine 2 MG/ML injection 2-4 mg  2-4 mg Intravenous Q3H PRN Arta Silence, MD      . ondansetron (ZOFRAN) injection 4 mg  4 mg Intravenous Q6H PRN Arta Silence, MD      . ramipril (ALTACE) capsule 10 mg  10 mg Oral BID Arta Silence, MD      . senna-docusate (Senokot-S) tablet  1 tablet  1 tablet Oral QHS PRN Arta Silence, MD         Discharge Medications: Please see discharge summary for a list of discharge medications.  Relevant Imaging Results:  Relevant Lab Results:   Additional Information (SSN: [id number])  Michele [name], Michele Beets, LCSW

## 2018-07-08 NOTE — Consult Note (Signed)
ORTHOPAEDIC CONSULTATION  REQUESTING PHYSICIAN: Fritzi Mandes, MD  Chief Complaint:   R hip pain  History of Present Illness: Michele [name] is a 84 y.o. female who had a fall at home last night after she attempted to walk without her walker.  The patient noted immediate hip pain and inability to ambulate.  The patient ambulates with a walker at baseline.  Pain is described as sharp at its worst and a dull ache at its best.  Pain is rated a 10 out of 10 in severity.  Pain is improved with rest and immobilization.  Pain is worse with any sort of movement.  X-rays in the emergency department show a minimally-displaced R femoral neck fracture.   Past Medical History:  Diagnosis Date  . LBBB (left bundle branch block)   . MI (myocardial infarction) ([place])   . Stroke [facility])    Past Surgical History:  Procedure Laterality Date  . ABDOMINAL HYSTERECTOMY    . APPENDECTOMY    . ROTATOR CUFF REPAIR Left    Social History   Socioeconomic History  . Marital status: Widowed    Spouse name: Not on file  . Number of children: Not on file  . Years of education: Not on file  . Highest education level: Not on file  Occupational History  . Not on file  Social Needs  . Financial resource strain: Not on file  . Food insecurity:    Worry: Not on file    Inability: Not on file  . Transportation needs:    Medical: Not on file    Non-medical: Not on file  Tobacco Use  . Smoking status: Never Smoker  . Smokeless tobacco: Never Used  Substance and Sexual Activity  . Alcohol use: No    Frequency: Never  . Drug use: Not on file  . Sexual activity: Not on file  Lifestyle  . Physical activity:    Days per week: Not on file    Minutes per session: Not on file  . Stress: Not on file  Relationships  . Social connections:    Talks on phone: Not on file    Gets together: Not on file    Attends religious service: Not on file    Active  member of club or organization: Not on file    Attends meetings of clubs or organizations: Not on file    Relationship status: Not on file  Other Topics Concern  . Not on file  Social History Narrative  . Not on file   History reviewed. No pertinent family history. Allergies  Allergen Reactions  . Penicillins Rash    Has patient had a PCN reaction causing immediate rash, facial/tongue/throat swelling, SOB or lightheadedness with hypotension: Yes Has patient had a PCN reaction causing severe rash involving mucus membranes or skin necrosis: No Has patient had a PCN reaction that required hospitalization: No Has patient had a PCN reaction occurring within the last 10 years: No If all of the above answers are "NO", then may proceed with Cephalosporin use.   Prior to Admission medications   Medication Sig Start Date End Date Taking? Authorizing Provider  ALPRAZolam (XANAX) 0.25 MG tablet Take 1 tablet (0.25 mg total) by mouth at bedtime as needed for anxiety. 11/11/17  Yes Max Sane, MD  aspirin 325 MG tablet Take 325 mg by mouth daily.   Yes [provider]  busPIRone (BUSPAR) 5 MG tablet Take 5 mg by mouth 2 (two) times daily.  Yes [provider]  diclofenac sodium (VOLTAREN) 1 % GEL Apply 4 g topically 4 (four) times daily as needed (pain).   Yes [provider]  escitalopram (LEXAPRO) 5 MG tablet Take 5 mg by mouth daily.   Yes [provider]  furosemide (LASIX) 20 MG tablet Take 20 mg by mouth every other day.   Yes [provider]  HYDROcodone-acetaminophen (NORCO/VICODIN) 5-325 MG tablet Take  to 1 tablet by mouth 3 times daily as needed for severe pain   Yes [provider]  metoprolol succinate (TOPROL-XL) 25 MG 24 hr tablet Take 25 mg by mouth daily.   Yes [provider]  potassium chloride (K-DUR) 10 MEQ tablet Take 10 mEq by mouth daily. 06/03/18  Yes [provider]  ramipril (ALTACE) 10 MG capsule Take  10 mg by mouth 2 (two) times daily.   Yes [provider]   Recent Labs    07/08/18 0259  WBC 13.1*  HGB 11.8*  HCT 34.7*  PLT 254  K 3.9  CL 105  CO2 25  BUN 13  CREATININE 1.01*  GLUCOSE 112*  CALCIUM 8.9  INR 1.11   Ct Pelvis Wo Contrast  Result Date: 07/08/2018 CLINICAL DATA:  Right hip and groin pain after a fall. EXAM: CT PELVIS WITHOUT CONTRAST TECHNIQUE: Multidetector CT imaging of the pelvis was performed following the standard protocol without intravenous contrast. COMPARISON:  Right hip radiographs 07/08/2018. CT abdomen and pelvis 12/19/2008 FINDINGS: Urinary Tract:  No abnormality visualized. Bowel:  Unremarkable visualized pelvic bowel loops. Vascular/Lymphatic: Aorto iliac calcifications. No significant lymphadenopathy. Reproductive: Uterus is surgically absent. Asymmetric enlargement of right ovary, measuring 4.6 cm maximal diameter. Left ovary is unremarkable. Other:  No free air or free fluid in the pelvis. Musculoskeletal: There is an impacted subcapital fracture of the proximal right femur. Pelvis and sacrum appear otherwise intact. Degenerative changes in the lower lumbar spine and hips. Small right hip effusion. IMPRESSION: Impacted subcapital fracture of the proximal right femur. No other fractures identified. Electronically Signed   By: Lucienne Capers M.D.   On: 07/08/2018 03:33   Dg Chest Portable 1 View  Result Date: 07/08/2018 CLINICAL DATA:  Preoperative EXAM: PORTABLE CHEST 1 VIEW COMPARISON:  05/09/2018 FINDINGS: Borderline heart size with normal pulmonary vascularity. Diffuse coarse interstitial pattern to the lungs likely representing fibrosis. No airspace disease or consolidation in the lungs. No blunting of costophrenic angles. No pneumothorax. Mediastinal contours appear intact. Calcification of the aorta. Degenerative changes in the spine and shoulders. IMPRESSION: Chronic interstitial fibrosis in the lungs. No evidence of active pulmonary disease.  Aortic atherosclerosis. Electronically Signed   By: Lucienne Capers M.D.   On: 07/08/2018 04:06   Dg Hip Unilat  With Pelvis 2-3 Views Right  Result Date: 07/08/2018 CLINICAL DATA:  Extreme pain and right hip and groin region after a fall on 09/03 at 8 p.m. EXAM: DG HIP (WITH OR WITHOUT PELVIS) 2-3V RIGHT COMPARISON:  None. FINDINGS: Degenerative changes in the lower lumbar spine and hips. Pelvis appears intact. SI joints and symphysis pubis are not displaced. Evaluation of right hip is limited due to rotation. There appears to be slight deformity of the subcapital femoral neck suggesting a possible impacted subcapital fracture. No displacement or dislocation. IMPRESSION: Degenerative changes in the lower lumbar spine and hips. Possible impacted subcapital fracture of the right femoral neck. No displacement or dislocation. Electronically Signed   By: Lucienne Capers M.D.   On: 07/08/2018 02:39  Positive ROS: All other systems have been reviewed and were otherwise negative with the exception of those mentioned in the HPI and as above.  Physical Exam: BP (!) 141/68 (BP Location: Right Arm)   Pulse 71   Temp 97.8 F (36.6 C) (Oral)   Resp 16   Ht 5\' 2"  (1.575 m)   Wt 58.8 kg   SpO2 96%   BMI 23.71 kg/m  General:  Alert, no acute distress Psychiatric:  Patient is competent for consent with normal mood and affect   Cardiovascular:  No pedal edema, regular rate and rhythm Respiratory:  No wheezing, non-labored breathing GI:  Abdomen is soft and non-tender Skin:  No lesions in the area of chief complaint, no erythema Neurologic:  Sensation intact distally, CN grossly intact Lymphatic:  No axillary or cervical lymphadenopathy  Orthopedic Exam:  RLE: 5/5 DF/PF/EHL SILT s/s/t/sp/dp distr Foot wwp +Log roll/axial load   X-rays/CT scan:  As above: R valgus impacted femoral neck fracture  Assessment/Plan:  LEXIS POTENZA is a 84 y.o. female with a R valgus impacted femoral neck  fracture   1. I discussed the various treatment options including both surgical and non-surgical management of her fracture with the patient and the patient's daughter Michele [name], [phone], medical PoA). We discussed the high risk of perioperative complications due to patient's age and other co-morbidities. After discussion of risks, benefits, and alternatives to surgery, the family and patient were in agreement to proceed with surgery. The goals of surgery would be to provide adequate pain relief and allow for early mobilization. Plan for surgery is R hip percutaneous pinning today, 07/08/2018 in the mid-late afternoon. 2. NPO until OR 3. Hold anticoagulation in advance of OR 4. Cardiology consult for medical optimization/clearance in advance of OR.            Michele [name]   07/08/2018 8:14 AM

## 2018-07-08 NOTE — Progress Notes (Signed)
Initial Nutrition Assessment  DOCUMENTATION CODES:   Not applicable  INTERVENTION:  Provide Ensure Enlive po BID, each supplement provides 350 kcal and 20 grams of protein.  Provide daily MVI.  Discussed increased needs for calories and protein for healing after surgery.  NUTRITION DIAGNOSIS:   Increased nutrient needs related to hip fracture, post-op healing as evidenced by estimated needs.  GOAL:   Patient will meet greater than or equal to 90% of their needs  MONITOR:   PO intake, Supplement acceptance, Diet advancement, Labs, Weight trends, Skin, I & O's  REASON FOR ASSESSMENT:   Consult Hip fracture protocol  ASSESSMENT:   84 year old female with PMHx of CVA, MI, left bundle branch block and PSHx of left rotator cuff repair, abdominal hysterectomy, appendectomy who is admitted after fall at home with right hip pain found to have minimally-displaced right femoral neck fracture.   -Plan is for operation this afternoon.  Met with patient and her family members at bedside. Patient reports her appetite PTA was "okay." She eats 3 meals per day but they are usually small meals. She is unable to provide any specific details on usual intake. She reports she is usually on a regular diet. Denies any issues with chewing or swallowing.  Patient reports her UBW is 130 lbs. Currently 58.8 kg (129.63 lbs).  Medications reviewed and include: Lasix 20 mg every other day.  Labs reviewed: Creatinine 1.01.  Patient is at risk for malnutrition but does not meet criteria for malnutrition at this time.  NUTRITION - FOCUSED PHYSICAL EXAM:    Most Recent Value  Orbital Region  No depletion  Upper Arm Region  Mild depletion  Thoracic and Lumbar Region  No depletion  Buccal Region  No depletion  Temple Region  Mild depletion  Clavicle Bone Region  Mild depletion  Clavicle and Acromion Bone Region  Mild depletion  Scapular Bone Region  Moderate depletion  Dorsal Hand  Moderate  depletion  Patellar Region  Unable to assess  Anterior Thigh Region  Unable to assess  Posterior Calf Region  Unable to assess  Edema (RD Assessment)  Unable to assess  Hair  Reviewed  Eyes  Reviewed  Mouth  Reviewed  Skin  Reviewed  Nails  Reviewed     Diet Order:   Diet Order            Diet NPO time specified Except for: Ice Chips, Sips with Meds, Other (See Comments)  Diet effective now              EDUCATION NEEDS:   Education needs have been addressed  Skin:  Skin Assessment: Reviewed RN Assessment  Last BM:  PTA (07/07/2018 per chart)  Height:   Ht Readings from Last 1 Encounters:  07/08/18 5' 2" (1.575 m)    Weight:   Wt Readings from Last 1 Encounters:  07/08/18 58.8 kg    Ideal Body Weight:  50 kg  BMI:  Body mass index is 23.71 kg/m.  Estimated Nutritional Needs:   Kcal:  1500-1700  Protein:  70-80 grams  Fluid:  1.5 L/day   Stephens, MS, RD, LDN Office: [phone] Pager: [phone] After Hours/Weekend Pager: [phone]  

## 2018-07-08 NOTE — Progress Notes (Signed)
Patient went up to OR. NSL in place. Foley draining clear yellow urine. Family at bedside. Signed consent on chart. Report called. Low bed sent with patient.

## 2018-07-08 NOTE — Progress Notes (Signed)
Lovenox changed to 30 mg daily for CrCl <30 and BMI <40. 

## 2018-07-08 NOTE — ED Provider Notes (Signed)
[facility] Emergency Department Provider Note  ____________________________________________   First MD Initiated Contact with Patient 07/08/18 [phone]     (approximate)  I have reviewed the triage vital signs and the nursing notes.   HISTORY  Chief Complaint Fall and Hip Pain    HPI Michele [name] is a 84 y.o. female who presents by EMS for evaluation of acute onset severe pain in her right hip.  She had a fall earlier tonight when she was ambulate into the bathroom without her walker and became tripped up, falling on her right side.  She did not strike her head, did not lose consciousness, and has no headache or neck pain.  Initially EMS was called but she was still able to ambulate on her hip and did not want to come to the hospital.  However as the next few hours past the pain became worse and worse and she ceased being able to bear weight.  Currently reports that the pain is severe, aching, sharp, stabbing, and worse with any amount of movement.  She has not had any prior orthopedic injuries.  She does have a history of heart disease/MI and her CVA that resulted in a visual deficit.  She denies chest pain, shortness of breath, nausea, vomiting, and abdominal pain.  The pain in her hip is severe but she has no numbness or tingling distally.  Past Medical History:  Diagnosis Date  . LBBB (left bundle branch block)   . MI (myocardial infarction) ([place])   . Stroke [facility])     Patient Active Problem List   Diagnosis Date Noted  .  07/08/2018  . Pneumonia 11/06/2017    Past Surgical History:  Procedure Laterality Date  . ABDOMINAL HYSTERECTOMY    . APPENDECTOMY    . ROTATOR CUFF REPAIR Left     Prior to Admission medications   Medication Sig Start Date End Date Taking? Authorizing Provider  ALPRAZolam (XANAX) 0.25 MG tablet Take 1 tablet (0.25 mg total) by mouth at bedtime as needed for anxiety. 11/11/17  Yes Max Sane, MD  aspirin 325 MG tablet Take 325 mg  by mouth daily.   Yes [provider]  busPIRone (BUSPAR) 5 MG tablet Take 5 mg by mouth 2 (two) times daily.   Yes [provider]  diclofenac sodium (VOLTAREN) 1 % GEL Apply 4 g topically 4 (four) times daily as needed (pain).   Yes [provider]  escitalopram (LEXAPRO) 5 MG tablet Take 5 mg by mouth daily.   Yes [provider]  furosemide (LASIX) 20 MG tablet Take 20 mg by mouth every other day.   Yes [provider]  HYDROcodone-acetaminophen (NORCO/VICODIN) 5-325 MG tablet Take  to 1 tablet by mouth 3 times daily as needed for severe pain   Yes [provider]  metoprolol succinate (TOPROL-XL) 25 MG 24 hr tablet Take 25 mg by mouth daily.   Yes [provider]  potassium chloride (K-DUR) 10 MEQ tablet Take 10 mEq by mouth daily. 06/03/18  Yes [provider]  ramipril (ALTACE) 10 MG capsule Take 10 mg by mouth 2 (two) times daily.   Yes [provider]    Allergies Penicillins  History reviewed. No pertinent family history.  Social History Social History   Tobacco Use  . Smoking status: Never Smoker  . Smokeless tobacco: Never Used  Substance Use Topics  . Alcohol use: No    Frequency: Never  . Drug use: Not on  file    Review of Systems Constitutional: No fever/chills Eyes: No visual changes. ENT: No sore throat. Cardiovascular: Denies chest pain. Respiratory: Denies shortness of breath. Gastrointestinal: No abdominal pain.  No nausea, no vomiting.  No diarrhea.  No constipation. Genitourinary: Negative for dysuria. Musculoskeletal: Severe right hip pain as described above,, no neck pain or back pain Integumentary: Negative for rash. Neurological: Negative for headaches, focal weakness or numbness.   ____________________________________________   PHYSICAL EXAM:  VITAL SIGNS: ED Triage Vitals  Enc Vitals Group     BP 07/08/18 0108 (!) 137/100     Pulse Rate 07/08/18 0108 80      Resp 07/08/18 0111 17     Temp 07/08/18 0111 98.6 F (37 C)     Temp Source 07/08/18 0111 Oral     SpO2 07/08/18 0108 91 %     Weight 07/08/18 0112 58.1 kg (128 lb 1.4 oz)     Height 07/08/18 0112 1.575 m (5\' 2" )     Head Circumference --      Peak Flow --      Pain Score 07/08/18 0110 10     Pain Loc --      Pain Edu? --      Excl. in [place]? --     Constitutional: Alert and oriented.  Elderly and in pain from the right hip but generally appropriate under the circumstances Eyes: Conjunctivae are normal.  Head: Atraumatic. Nose: No congestion/rhinnorhea. Mouth/Throat: Mucous membranes are moist. Neck: No stridor.  No meningeal signs.  No cervical spine tenderness to palpation. Cardiovascular: Normal rate, regular rhythm. Good peripheral circulation. Grossly normal heart sounds. Respiratory: Normal respiratory effort.  No retractions. Lungs CTAB. Gastrointestinal: Soft and nontender. No distention.  Musculoskeletal: Right lower extremity is slightly externally rotated and shortened.  She is neurovascularly intact and able to wiggle her toes and has no subjective loss of sensation.  She has tenderness to palpation of the right proximal femur. Neurologic:  Normal speech and language. No gross focal neurologic deficits are appreciated.  Skin:  Skin is warm, dry and intact. No rash noted. Psychiatric: Mood and affect are normal. Speech and behavior are normal.  ____________________________________________   LABS (all labs ordered are listed, but only abnormal results are displayed)  Labs Reviewed  BASIC METABOLIC PANEL - Abnormal; Notable for the following components:      Result Value   Glucose, Bld 112 (*)    Creatinine, Ser 1.01 (*)    GFR calc non Af Amer 47 (*)    GFR calc Af Amer 54 (*)    All other components within normal limits  CBC WITH DIFFERENTIAL/PLATELET - Abnormal; Notable for the following components:   WBC 13.1 (*)    Hemoglobin 11.8 (*)    HCT 34.7 (*)    Neutro  Abs 11.3 (*)    Lymphs Abs 0.8 (*)    All other components within normal limits  SURGICAL PCR SCREEN  PROTIME-INR  URINALYSIS, ROUTINE W REFLEX MICROSCOPIC  TYPE AND SCREEN   ____________________________________________  EKG  ED ECG REPORT I, Hinda Kehr, the attending physician, personally viewed and interpreted this ECG.  Date: 07/08/2018 EKG Time: 3:58 AM Rate: 78 Rhythm: normal sinus rhythm QRS Axis: Left axis deviation Intervals: Left bundle branch block ST/T Wave abnormalities: Non-specific ST segment / T-wave changes, but no evidence of acute ischemia. Narrative Interpretation: no evidence of acute ischemia.  Prior EKG also reveals left bundle branch block   ____________________________________________  RADIOLOGY I,  Hinda Kehr, personally viewed and evaluated these images (plain radiographs) as part of my medical decision making, as well as reviewing the written report by the radiologist.  ED MD interpretation: Equivocal femur fracture on radiograph but confirmed on CT scan.  Chronic lung disease but no active infiltrate on chest x-ray.  Official radiology report(s): Ct Pelvis Wo Contrast  Result Date: 07/08/2018 CLINICAL DATA:  Right hip and groin pain after a fall. EXAM: CT PELVIS WITHOUT CONTRAST TECHNIQUE: Multidetector CT imaging of the pelvis was performed following the standard protocol without intravenous contrast. COMPARISON:  Right hip radiographs 07/08/2018. CT abdomen and pelvis 12/19/2008 FINDINGS: Urinary Tract:  No abnormality visualized. Bowel:  Unremarkable visualized pelvic bowel loops. Vascular/Lymphatic: Aorto iliac calcifications. No significant lymphadenopathy. Reproductive: Uterus is surgically absent. Asymmetric enlargement of right ovary, measuring 4.6 cm maximal diameter. Left ovary is unremarkable. Other:  No free air or free fluid in the pelvis. Musculoskeletal: There is an impacted subcapital fracture of the proximal right femur. Pelvis and  sacrum appear otherwise intact. Degenerative changes in the lower lumbar spine and hips. Small right hip effusion. IMPRESSION: Impacted subcapital fracture of the proximal right femur. No other fractures identified. Electronically Signed   By: Lucienne Capers M.D.   On: 07/08/2018 03:33   Dg Chest Portable 1 View  Result Date: 07/08/2018 CLINICAL DATA:  Preoperative EXAM: PORTABLE CHEST 1 VIEW COMPARISON:  05/09/2018 FINDINGS: Borderline heart size with normal pulmonary vascularity. Diffuse coarse interstitial pattern to the lungs likely representing fibrosis. No airspace disease or consolidation in the lungs. No blunting of costophrenic angles. No pneumothorax. Mediastinal contours appear intact. Calcification of the aorta. Degenerative changes in the spine and shoulders. IMPRESSION: Chronic interstitial fibrosis in the lungs. No evidence of active pulmonary disease. Aortic atherosclerosis. Electronically Signed   By: Lucienne Capers M.D.   On: 07/08/2018 04:06   Dg Hip Unilat  With Pelvis 2-3 Views Right  Result Date: 07/08/2018 CLINICAL DATA:  Extreme pain and right hip and groin region after a fall on 09/03 at 8 p.m. EXAM: DG HIP (WITH OR WITHOUT PELVIS) 2-3V RIGHT COMPARISON:  None. FINDINGS: Degenerative changes in the lower lumbar spine and hips. Pelvis appears intact. SI joints and symphysis pubis are not displaced. Evaluation of right hip is limited due to rotation. There appears to be slight deformity of the subcapital femoral neck suggesting a possible impacted subcapital fracture. No displacement or dislocation. IMPRESSION: Degenerative changes in the lower lumbar spine and hips. Possible impacted subcapital fracture of the right femoral neck. No displacement or dislocation. Electronically Signed   By: Lucienne Capers M.D.   On: 07/08/2018 02:39    ____________________________________________   PROCEDURES  Critical Care performed: No   Procedure(s) performed:    Procedures   ____________________________________________   INITIAL IMPRESSION / ASSESSMENT AND PLAN / ED COURSE  As part of my medical decision making, I reviewed the following data within the [facility] notes reviewed and incorporated, Labs reviewed , EKG interpreted , Old EKG reviewed, Old chart reviewed, Radiograph reviewed , Discussed with admitting physician , Discussed with radiologist and Notes from prior ED visits    Differential diagnosis includes, but is not limited to, hip/femur fracture or dislocation, pelvic fracture, contusion or musculoskeletal pain/strain.  The patient had no syncopal episode, and no loss of conscious, no head injury, neck injury, nor back injury.  I strongly suspected hip fracture and the radiograph was equivocal.  I ordered a CT scan of the pelvis for  confirmation and it did confirm a subcapital femur fracture.  I discussed the case by phone with Dr. Leim Fabry who will put the patient on his list and plan for surgery later today.  I discussed the case with the hospitalist who will admit and obtain cardiology consult for clearance for surgery.  The patient required morphine 2 mg IV, then morphine 4 mill grams IV, then Versed 0.5 mill grams IV) normal I would not order benzodiazepines for an elderly patient, but she typically takes 0.25 mg of Xanax at home and she was very agitated prior to CT scan).  When I evaluated her before she went upstairs she remained neurovascularly intact and was very alert and appropriate, pain still present but mild and improved from prior.  Her lab work is reassuring with an essentially normal basic metabolic panel, CBC that is normal other than a mild leukocytosis, normal coags, and type and screen has been ordered.  Vital signs are stable and she is appropriate for transfer to the floor.     ____________________________________________  FINAL CLINICAL IMPRESSION(S) / ED DIAGNOSES  Final diagnoses:   Closed subcapital fracture of right femur, initial encounter ([place])     MEDICATIONS GIVEN DURING THIS VISIT:  Medications  morphine 2 MG/ML injection 2-4 mg (has no administration in time range)  morphine 2 MG/ML injection 2 mg (2 mg Intravenous Given 07/08/18 0213)  ondansetron (ZOFRAN) injection 4 mg (4 mg Intravenous Given 07/08/18 0213)  morphine 4 MG/ML injection 4 mg (4 mg Intravenous Given 07/08/18 0300)  0.9 %  sodium chloride infusion ( Intravenous New Bag/Given 07/08/18 0331)  midazolam (VERSED) 5 MG/5ML injection 0.5 mg (5 mg Intravenous Given 07/08/18 0300)     ED Discharge Orders    None       Note:  This document was prepared using Dragon voice recognition software and may include unintentional dictation errors.    Hinda Kehr, MD 07/08/18 [phone]

## 2018-07-09 ENCOUNTER — Encounter
Admission: RE | Admit: 2018-07-09 | Discharge: 2018-07-09 | Disposition: A | Discharge status: Home / Self Care | Payer: Medicare Other | Source: Ambulatory Visit | Attending: Internal Medicine | Admitting: Internal Medicine

## 2018-07-09 ENCOUNTER — Encounter: Payer: Self-pay | Admitting: Orthopedic Surgery

## 2018-07-09 LAB — COMPREHENSIVE METABOLIC PANEL
ALK PHOS: 59 U/L (ref 38–126)
ALT: 12 U/L (ref 0–44)
ANION GAP: 8 (ref 5–15)
AST: 17 U/L (ref 15–41)
Albumin: 2.8 g/dL — ABNORMAL LOW (ref 3.5–5.0)
BUN: 13 mg/dL (ref 8–23)
CALCIUM: 7.9 mg/dL — AB (ref 8.9–10.3)
CO2: 22 mmol/L (ref 22–32)
CREATININE: 0.91 mg/dL (ref 0.44–1.00)
Chloride: 107 mmol/L (ref 98–111)
GFR calc Af Amer: >60 mL/min (ref >60)
GFR calc non Af Amer: 53 mL/min — ABNORMAL LOW (ref >60)
GLUCOSE: 111 mg/dL — AB (ref 70–99)
Potassium: 3.7 mmol/L (ref 3.5–5.1)
SODIUM: 137 mmol/L (ref 135–145)
Total Bilirubin: 0.8 mg/dL (ref 0.3–1.2)
Total Protein: 5.2 g/dL — ABNORMAL LOW (ref 6.5–8.1)

## 2018-07-09 NOTE — Evaluation (Signed)
Occupational Therapy Evaluation Patient Details Name: Michele [name] MRN: [id number] DOB: 27-Aug-1924 Today's Date: 07/09/2018    History of Present Illness 84yo female POD#1 s/p R hip pinning after hip fx. PMHx including LBBB, MI, CVA.   Clinical Impression   Pt seen for OT evaluation this date, POD#1 from above surgery. Pt was fairly independent in ADLs prior to surgery, however using a RW for mobility and having assist 24/7 from [place] and [place] and [place]. Pt is eager to return to PLOF with less pain and improved safety and independence. Pt currently requires mod assist for LB dressing and bathing while in seated position due to pain and limited AROM of R hip. Pt instructed in self care skills, falls prevention strategies, and home/routines modifications. Pt requires cues t/o functional mobility for toileting for PWBing on RLE and safety. Pt able to perform toilet transfer from [facility] frame over toilet with CGA. Pt would benefit from additional instruction in self care skills and techniques to help maintain precautions with or without assistive devices to support recall and carryover prior to discharge. Recommend STR upon discharge.     Follow Up Recommendations  SNF    Equipment Recommendations  3 in 1 bedside commode    Recommendations for Other Services       Precautions / Restrictions Precautions Precautions: Fall;Other (comment) Precaution Comments: RLE PWBing 50% Restrictions Weight Bearing Restrictions: Yes RLE Weight Bearing: Partial weight bearing RLE Partial Weight Bearing Percentage or Pounds: 50      Mobility Bed Mobility               General bed mobility comments: deferred, up in recliner  Transfers Overall transfer level: Needs assistance Equipment used: Rolling walker (2 wheeled) Transfers: Sit to/from Stand Sit to Stand: Min guard         General transfer comment: cues for RLE PWBing and safety for hand/foot placement     Balance Overall balance assessment: Needs assistance Sitting-balance support: Feet supported;Feet unsupported Sitting balance-Leahy Scale: Fair     Standing balance support: Bilateral upper extremity supported Standing balance-Leahy Scale: Fair                             ADL either performed or assessed with clinical judgement   ADL Overall ADL's : Needs assistance/impaired Eating/Feeding: Sitting;Independent   Grooming: Standing;Min guard;Wash/dry hands   Upper Body Bathing: Sitting;Minimal assistance   Lower Body Bathing: Sit to/from stand;Moderate assistance   Upper Body Dressing : Sitting;Minimal assistance   Lower Body Dressing: Sit to/from stand;Moderate assistance   Toilet Transfer: Ambulation;Comfort height toilet;RW;Min guard;Cueing for safety Toilet Transfer Details (indicate cue type and reason): cues for hand placement to maximize safety with BSC over toilet Toileting- Clothing Manipulation and Hygiene: Sitting/lateral lean;Supervision/safety   Tub/ Shower Transfer: 3 in 1;Ambulation;Min guard;Rolling walker Tub/Shower Transfer Details (indicate cue type and reason): VC hand placement to stand Functional mobility during ADLs: Minimal assistance;Min guard;Cueing for safety;Rolling walker       Vision Patient Visual Report: No change from baseline       Perception     Praxis      Pertinent Vitals/Pain Pain Assessment: 0-10 Pain Score: 6  Pain Location: R hip and R knee (chronic OA) Pain Descriptors / Indicators: Aching Pain Intervention(s): Limited activity within patient's tolerance;Monitored during session;Repositioned     Hand Dominance Right   Extremity/Trunk Assessment Upper Extremity Assessment Upper Extremity Assessment:  Generalized weakness   Lower Extremity Assessment Lower Extremity Assessment: Generalized weakness;RLE deficits/detail;Defer to PT evaluation RLE: Unable to fully assess due to pain       Communication  Communication Communication: HOH   Cognition Arousal/Alertness: Awake/alert Behavior During Therapy: WFL for tasks assessed/performed Overall Cognitive Status: Within Functional Limits for tasks assessed                                 General Comments: needs some continued cueing for RLE PWBing    General Comments       Exercises Other Exercises Other Exercises: pt instructed during functional mobility for toileting in hand/foot placement and RW placement to maximize safety and maintain PWBing to RLE   Shoulder Instructions      Home Living Family/patient expects to be discharged to:: [place] ([place])([place] Living Arrangements: Alone Available Help at Discharge: Family;Personal care attendant;Friend(s);Available 24 hours/day(24/7 care, friend comes 5d/wk + PCA) Type of Home: Independent living facility(ILF apt)                 Bathroom Toilet: Standard     Home Equipment: Cane - single point;Walker - 2 wheels          Prior Functioning/Environment Level of Independence: Needs assistance  Gait / Transfers Assistance Needed: pt ambulating with RW per friend's report who was in room ADL's / Homemaking Assistance Needed: Pt's friend assists with chores as needed            OT Problem List: Decreased strength;Decreased knowledge of use of DME or AE;Decreased knowledge of precautions;Impaired balance (sitting and/or standing);Decreased activity tolerance;Pain      OT Treatment/Interventions: Self-care/ADL training;Balance training;Therapeutic exercise;Therapeutic activities;DME and/or AE instruction;Patient/family education    OT Goals(Current goals can be found in the care plan section) Acute Rehab OT Goals Patient Stated Goal: to walk better and go home OT Goal Formulation: With patient Time For Goal Achievement: 07/23/18 Potential to Achieve Goals: Good ADL Goals Pt Will Perform Lower Body Dressing: sit to/from stand;with adaptive  equipment;with min guard assist Pt Will Transfer to Toilet: with supervision;ambulating(LRAD for amb, maintaining PWBing on RLE) Additional ADL Goal #1: Pt will return demo 50% PWBing on RLE during functional mobility tasks for ADL with no verbal cues for technique, 5/5 opportunities, to maximize safety and recovery per surgeon's protocol.  OT Frequency: Min 1X/week   Barriers to D/C:            Co-evaluation              AM-PAC PT "6 Clicks" Daily Activity     Outcome Measure Help from another person eating meals?: None Help from another person taking care of personal grooming?: None Help from another person toileting, which includes using toliet, bedpan, or urinal?: A Little Help from another person bathing (including washing, rinsing, drying)?: A Lot Help from another person to put on and taking off regular upper body clothing?: A Little Help from another person to put on and taking off regular lower body clothing?: A Lot 6 Click Score: 18   End of Session Equipment Utilized During Treatment: Gait belt;Rolling walker  Activity Tolerance: Patient tolerated treatment well Patient left: in chair;with call bell/phone within reach;with chair alarm set;with family/visitor present;with SCD's reapplied  OT Visit Diagnosis: Other abnormalities of gait and mobility (R26.89);Muscle weakness (generalized) (M62.81);Pain Pain - Right/Left: Right Pain - part of body: Hip  Time: 0459-1368 OT Time Calculation (min): 27 min Charges:  OT General Charges $OT Visit: 1 Visit OT Evaluation $OT Eval Low Complexity: 1 Low OT Treatments $Self Care/Home Management : 8-22 mins  Jeni Salles, MPH, MS, OTR/L ascom [phone] 07/09/18, 12:01 PM

## 2018-07-09 NOTE — Care Management (Signed)
RNCM met with a patient and her hired caregiver. She lives at independent living [place]. She has a front wheeled walker available for use. She plans to go to [facility] at discharge.  Home health list provided.

## 2018-07-09 NOTE — Progress Notes (Signed)
Physical Therapy Treatment Patient Details Name: Michele ALVERSON MRN: [id number] DOB: 06/26/1924 Today's Date: 07/09/2018    History of Present Illness 84yo female s/p R hip pinning (9/4) after hip fx. PMHx including LBBB, MI, CVA.    PT Comments    Pt did well with PT this afternoon showing increased independence and confidence with mobility, transfers and gait.  She was able to greatly increase ambulation distance with the walker despite continued R foot dragging.  Pt is motivated and eager to work hard with PT.  Follow Up Recommendations  SNF     Equipment Recommendations  None recommended by PT    Recommendations for Other Services       Precautions / Restrictions Precautions Precautions: Fall Precaution Comments: RLE PWBing 50% Restrictions Weight Bearing Restrictions: Yes RLE Weight Bearing: Partial weight bearing RLE Partial Weight Bearing Percentage or Pounds: 50    Mobility  Bed Mobility Overal bed mobility: Modified Independent             General bed mobility comments: pt in recliner on arrival, returned to recliner post session  Transfers Overall transfer level: Modified independent Equipment used: Rolling walker (2 wheeled) Transfers: Sit to/from Stand Sit to Stand: Min guard         General transfer comment: Pt able to rise to standing w/o phyiscal assist, light cuing for set up.  Pt did not show good awareness with getting to sitting and (similar to AM session) reached out to grab arm rest while still far from the walker and with hips essentially perpendicular to surface  Ambulation/Gait Ambulation/Gait assistance: Min guard Gait Distance (Feet): 75 Feet Assistive device: Rolling walker (2 wheeled)       General Gait Details: Pt continues to have R toe out with poor clearance, but ultimately had good safety and confidence with ambulation.  Pt fatigued with the effort but safe and relatively consistent.   Stairs             Wheelchair  Mobility    Modified Rankin (Stroke Patients Only)       Balance Overall balance assessment: Needs assistance Sitting-balance support: Feet supported;Feet unsupported Sitting balance-Leahy Scale: Good Sitting balance - Comments: Pt able to maintain sitting balance with good confidence   Standing balance support: Bilateral upper extremity supported Standing balance-Leahy Scale: Fair Standing balance comment: Pt is highly reliant on the walker, poor control with R LE                            Cognition Arousal/Alertness: Awake/alert Behavior During Therapy: WFL for tasks assessed/performed Overall Cognitive Status: Within Functional Limits for tasks assessed                                 General Comments: needs some continued cueing for RLE PWBing       Exercises General Exercises - Lower Extremity Ankle Circles/Pumps: AROM;10 reps Quad Sets: Strengthening;10 reps Gluteal Sets: Strengthening;10 reps Short Arc Quad: Strengthening;10 reps Long Arc Quad: Strengthening;10 reps Heel Slides: AAROM;5 reps Hip ABduction/ADduction: AROM;10 reps Other Exercises Other Exercises: pt instructed during functional mobility for toileting in hand/foot placement and RW placement to maximize safety and maintain PWBing to RLE    General Comments        Pertinent Vitals/Pain Pain Assessment: 0-10 Pain Score: 4  Pain Location: reports R OA pain is worse than R  hip/sx pain Pain Descriptors / Indicators: Aching Pain Intervention(s): Limited activity within patient's tolerance;Monitored during session;Repositioned    Home Living Family/patient expects to be discharged to:: Skilled nursing facility Living Arrangements: Alone Available Help at Discharge: Available 24 hours/day Type of Home: Independent living facility       Home Equipment: Kasandra Knudsen - single point;Walker - 2 wheels;Wheelchair - manual      Prior Function Level of Independence: Needs assistance   Gait / Transfers Assistance Needed: Pt generally does well with limited distance mobility using AD ADL's / Homemaking Assistance Needed: Pt's friend assists with chores as needed     PT Goals (current goals can now be found in the care plan section) Acute Rehab PT Goals Patient Stated Goal: motivated to get back home PT Goal Formulation: With patient Time For Goal Achievement: 07/23/18 Potential to Achieve Goals: Good Progress towards PT goals: Progressing toward goals    Frequency    BID      PT Plan Current plan remains appropriate    Co-evaluation              AM-PAC PT "6 Clicks" Daily Activity  Outcome Measure  Difficulty turning over in bed (including adjusting bedclothes, sheets and blankets)?: A Little Difficulty moving from lying on back to sitting on the side of the bed? : A Little Difficulty sitting down on and standing up from a chair with arms (e.g., wheelchair, bedside commode, etc,.)?: A Little Help needed moving to and from a bed to chair (including a wheelchair)?: None Help needed walking in hospital room?: A Little Help needed climbing 3-5 steps with a railing? : A Little 6 Click Score: 19    End of Session Equipment Utilized During Treatment: Gait belt Activity Tolerance: Patient limited by fatigue Patient left: with chair alarm set;with call bell/phone within reach;with family/visitor present   PT Visit Diagnosis: Muscle weakness (generalized) (M62.81);Difficulty in walking, not elsewhere classified (R26.2)     Time: 1350-1420 PT Time Calculation (min) (ACUTE ONLY): 30 min  Charges:  $Gait Training: 8-22 mins $Therapeutic Exercise: 8-22 mins                     Kreg Shropshire, DPT 07/09/2018, 3:26 PM

## 2018-07-09 NOTE — Discharge Instructions (Signed)
INSTRUCTIONS AFTER Surgery  o Remove items at home which could result in a fall. This includes throw rugs or furniture in walking pathways o ICE to the affected joint every three hours while awake for 30 minutes at a time, for at least the first 3-5 days, and then as needed for pain and swelling.  Continue to use ice for pain and swelling. You may notice swelling that will progress down to the foot and ankle.  This is normal after surgery.  Elevate your leg when you are not up walking on it.   o Continue to use the breathing machine you got in the hospital (incentive spirometer) which will help keep your temperature down.  It is common for your temperature to cycle up and down following surgery, especially at night when you are not up moving around and exerting yourself.  The breathing machine keeps your lungs expanded and your temperature down.   DIET:  As you were doing prior to hospitalization, we recommend a well-balanced diet.  DRESSING / WOUND CARE / SHOWERING  Dressing is waterproof.  Able to shower.  Change bandage as needed.  Staples will be removed in 2 weeks at [facility]  o Increase activity slowly as tolerated, but follow the weight bearing instructions below.   o No driving for 6 weeks or until further direction given by your physician.  You cannot drive while taking narcotics.  o No lifting or carrying greater than 10 lbs. until further directed by your surgeon. o Avoid periods of inactivity such as sitting longer than an hour when not asleep. This helps prevent blood clots.  o You may return to work once you are authorized by your doctor.     WEIGHT BEARING  Partial weightbearing on the right.   EXERCISES Gait training and ambulation with a walker.  CONSTIPATION  Constipation is defined medically as fewer than three stools per week and severe constipation as less than one stool per week.  Even if you have a regular bowel pattern at home, your normal  regimen is likely to be disrupted due to multiple reasons following surgery.  Combination of anesthesia, postoperative narcotics, change in appetite and fluid intake all can affect your bowels.   YOU MUST use at least one of the following options; they are listed in order of increasing strength to get the job done.  They are all available over the counter, and you may need to use some, POSSIBLY even all of these options:    Drink plenty of fluids (prune juice may be helpful) and high fiber foods Colace 100 mg by mouth twice a day  Senokot for constipation as directed and as needed Dulcolax (bisacodyl), take with full glass of water  Miralax (polyethylene glycol) once or twice a day as needed.  If you have tried all these things and are unable to have a bowel movement in the first 3-4 days after surgery call either your surgeon or your primary doctor.    If you experience loose stools or diarrhea, hold the medications until you stool forms back up.  If your symptoms do not get better within 1 week or if they get worse, check with your doctor.  If you experience "the worst abdominal pain ever" or develop nausea or vomiting, please contact the office immediately for further recommendations for treatment.   ITCHING:  If you experience itching with your medications, try taking only a single pain pill, or even half a pain pill at  a time.  You can also use Benadryl over the counter for itching or also to help with sleep.   TED HOSE STOCKINGS:  Use stockings on both legs until for at least 2 weeks or as directed by physician office. They may be removed at night for sleeping.  MEDICATIONS:  See your medication summary on the After Visit Summary that nursing will review with you.  You may have some home medications which will be placed on hold until you complete the course of blood thinner medication.  It is important for you to complete the blood thinner medication as prescribed.  PRECAUTIONS:  If you  experience chest pain or shortness of breath - call 911 immediately for transfer to the hospital emergency department.   If you develop a fever greater that 101 F, purulent drainage from wound, increased redness or drainage from wound, foul odor from the wound/dressing, or calf pain - CONTACT YOUR SURGEON.                                                   FOLLOW-UP APPOINTMENTS:  If you do not already have a post-op appointment, please call the office for an appointment to be seen by your surgeon.  Guidelines for how soon to be seen are listed in your After Visit Summary, but are typically between 1-4 weeks after surgery.  OTHER INSTRUCTIONS:     MAKE SURE YOU:   Understand these instructions.   Get help right away if you are not doing well or get worse.    Thank you for letting us be a part of your medical care team.  It is a privilege we respect greatly.  We hope these instructions will help you stay on track for a fast and full recovery!

## 2018-07-09 NOTE — Progress Notes (Signed)
[facility] Encounter Note  Patient: Michele [name] / Admit Date: 07/08/2018 / Date of Encounter: 07/09/2018, 1:59 PM   Subjective: She tolerated surgery without evidence of significant complication.  No evidence of worsening heart failure angina with recovery today.  No changes in EKG or telemetry  Review of Systems: Positive for: Pain Negative for: Vision change, hearing change, syncope, dizziness, nausea, vomiting,diarrhea, bloody stool, stomach pain, cough, congestion, diaphoresis, urinary frequency, urinary pain,skin lesions, skin rashes Others previously listed  Objective: Telemetry: Sinus rhythm Physical Exam: Blood pressure (!) 134/53, pulse 76, temperature 99.1 F (37.3 C), temperature source Oral, resp. rate 18, height 5\' 2"  (1.575 m), weight 58.8 kg, SpO2 92 %. Body mass index is 23.71 kg/m. General: Well developed, well nourished, in no acute distress. Head: Normocephalic, atraumatic, sclera non-icteric, no xanthomas, nares are without discharge. Neck: No apparent masses Lungs: Normal respirations with no wheezes, no rhonchi, no rales , no crackles   Heart: Regular rate and rhythm, normal S1 S2, no 3+ aortic murmur, no rub, no gallop, PMI is normal size and placement, carotid upstroke normal without bruit, jugular venous pressure normal Abdomen: Soft, non-tender, non-distended with normoactive bowel sounds. No hepatosplenomegaly. Abdominal aorta is normal size without bruit Extremities: No edema, no clubbing, no cyanosis, no ulcers,  Peripheral: 2+ radial, 2+ femoral, 2+ dorsal pedal pulses Neuro: Alert and oriented. Moves all extremities spontaneously. Psych:  Responds to questions appropriately with a normal affect.   Intake/Output Summary (Last 24 hours) at 07/09/2018 1359 Last data filed at 07/09/2018 1157 Gross per 24 hour  Intake 1873.4 ml  Output 1400 ml  Net 473.4 ml    Inpatient Medications:  . acetaminophen  1,000 mg Oral Q8H  . aspirin  325  mg Oral Daily  . busPIRone  5 mg Oral BID  . docusate sodium  100 mg Oral BID  . enoxaparin (LOVENOX) injection  30 mg Subcutaneous Q24H  . escitalopram  5 mg Oral Daily  . feeding supplement (ENSURE ENLIVE)  237 mL Oral BID BM  . furosemide  20 mg Oral QODAY  . metoprolol succinate  25 mg Oral Daily  . multivitamin with minerals  1 tablet Oral Daily  . ramipril  10 mg Oral BID   Infusions:  . sodium chloride 75 mL/hr at 07/09/18 1156  . methocarbamol (ROBAXIN) IV      Labs: Recent Labs    07/08/18 0259 07/09/18 0354  NA 136 137  K 3.9 3.7  CL 105 107  CO2 25 22  GLUCOSE 112* 111*  BUN 13 13  CREATININE 1.01* 0.91  CALCIUM 8.9 7.9*   Recent Labs    07/09/18 0354  AST 17  ALT 12  ALKPHOS 59  BILITOT 0.8  PROT 5.2*  ALBUMIN 2.8*   Recent Labs    07/08/18 0259  WBC 13.1*  NEUTROABS 11.3*  HGB 11.8*  HCT 34.7*  MCV 87.2  PLT 254   No results for input(s): CKTOTAL, CKMB, TROPONINI in the last 72 hours. Invalid input(s): POCBNP No results for input(s): HGBA1C in the last 72 hours.   Weights: Filed Weights   07/08/18 0112 07/08/18 0459  Weight: 58.1 kg 58.8 kg     Radiology/Studies:  Ct Pelvis Wo Contrast  Result Date: 07/08/2018 CLINICAL DATA:  Right hip and groin pain after a fall. EXAM: CT PELVIS WITHOUT CONTRAST TECHNIQUE: Multidetector CT imaging of the pelvis was performed following the standard protocol without intravenous contrast. COMPARISON:  Right hip radiographs 07/08/2018.  CT abdomen and pelvis 12/19/2008 FINDINGS: Urinary Tract:  No abnormality visualized. Bowel:  Unremarkable visualized pelvic bowel loops. Vascular/Lymphatic: Aorto iliac calcifications. No significant lymphadenopathy. Reproductive: Uterus is surgically absent. Asymmetric enlargement of right ovary, measuring 4.6 cm maximal diameter. Left ovary is unremarkable. Other:  No free air or free fluid in the pelvis. Musculoskeletal: There is an impacted subcapital fracture of the  proximal right femur. Pelvis and sacrum appear otherwise intact. Degenerative changes in the lower lumbar spine and hips. Small right hip effusion. IMPRESSION: Impacted subcapital fracture of the proximal right femur. No other fractures identified. Electronically Signed   By: Lucienne Capers M.D.   On: 07/08/2018 03:33   Dg Chest Portable 1 View  Result Date: 07/08/2018 CLINICAL DATA:  Preoperative EXAM: PORTABLE CHEST 1 VIEW COMPARISON:  05/09/2018 FINDINGS: Borderline heart size with normal pulmonary vascularity. Diffuse coarse interstitial pattern to the lungs likely representing fibrosis. No airspace disease or consolidation in the lungs. No blunting of costophrenic angles. No pneumothorax. Mediastinal contours appear intact. Calcification of the aorta. Degenerative changes in the spine and shoulders. IMPRESSION: Chronic interstitial fibrosis in the lungs. No evidence of active pulmonary disease. Aortic atherosclerosis. Electronically Signed   By: Lucienne Capers M.D.   On: 07/08/2018 04:06   Dg Hip Operative Unilat W Or W/o Pelvis Right  Result Date: 07/08/2018 CLINICAL DATA:  Operative right hip EXAM: OPERATIVE right HIP (WITH PELVIS IF PERFORMED) 7 VIEWS TECHNIQUE: Fluoroscopic spot image(s) were submitted for interpretation post-operatively. COMPARISON:  07/08/2018 FINDINGS: Seven low resolution intraoperative spot views of the right hip. Images were obtained during operative threaded screw fixation of right femoral neck fracture. IMPRESSION: Intraoperative fluoroscopic assistance provided during surgical fixation of right femoral neck fracture. Electronically Signed   By: Donavan Foil M.D.   On: 07/08/2018 20:21   Dg Hip Unilat  With Pelvis 2-3 Views Right  Result Date: 07/08/2018 CLINICAL DATA:  Extreme pain and right hip and groin region after a fall on 09/03 at 8 p.m. EXAM: DG HIP (WITH OR WITHOUT PELVIS) 2-3V RIGHT COMPARISON:  None. FINDINGS: Degenerative changes in the lower lumbar spine  and hips. Pelvis appears intact. SI joints and symphysis pubis are not displaced. Evaluation of right hip is limited due to rotation. There appears to be slight deformity of the subcapital femoral neck suggesting a possible impacted subcapital fracture. No displacement or dislocation. IMPRESSION: Degenerative changes in the lower lumbar spine and hips. Possible impacted subcapital fracture of the right femoral neck. No displacement or dislocation. Electronically Signed   By: Lucienne Capers M.D.   On: 07/08/2018 02:39     Assessment and Recommendation  84 y.o. female 84 year old female with mild aortic valve stenosis mild LV systolic dysfunction essential hypertension mixed hyperlipidemia with acute orthopedic fracture now better after surgical intervention and no symptoms or complications 1.  No change in current medical regimen for hypertension control cardiomyopathy and left bundle branch block 2.  No restrictions to physical rehabilitation from surgery 3.  No further cardiac diagnostics necessary at this time 4.  Call if further questions  Signed, Serafina Royals M.D. FACC

## 2018-07-09 NOTE — Progress Notes (Signed)
Pt has been confused this afternoon after being A&OX4 all day today. Pt has been impulsive and getting up without staff. Pt also removed PIV.   [place], Jerry Caras

## 2018-07-09 NOTE — Progress Notes (Signed)
[facility] [place] NAME: Raja Caputi    MR#:  [id number]  DATE OF BIRTH:  24-Oct-1924  SUBJECTIVE:   Doing well. Pleasantly confused Did well with PT REVIEW OF SYSTEMS:   Review of Systems  Constitutional: Negative for chills, fever and weight loss.  HENT: Negative for ear discharge, ear pain and nosebleeds.   Eyes: Negative for blurred vision, pain and discharge.  Respiratory: Negative for sputum production, shortness of breath, wheezing and stridor.   Cardiovascular: Negative for chest pain, palpitations, orthopnea and PND.  Gastrointestinal: Negative for abdominal pain, diarrhea, nausea and vomiting.  Genitourinary: Negative for frequency and urgency.  Musculoskeletal: Negative for back pain and joint pain.  Neurological: Negative for sensory change, speech change, focal weakness and weakness.  Psychiatric/Behavioral: Negative for depression and hallucinations. The patient is not nervous/anxious.    Tolerating Diet:yes Tolerating PT: yes  DRUG ALLERGIES:   Allergies  Allergen Reactions  . Penicillins Rash    Has patient had a PCN reaction causing immediate rash, facial/tongue/throat swelling, SOB or lightheadedness with hypotension: Yes Has patient had a PCN reaction causing severe rash involving mucus membranes or skin necrosis: No Has patient had a PCN reaction that required hospitalization: No Has patient had a PCN reaction occurring within the last 10 years: No If all of the above answers are "NO", then may proceed with Cephalosporin use.    VITALS:  Blood pressure 119/66, pulse 74, temperature 97.6 F (36.4 C), temperature source Oral, resp. rate 18, height 5\' 2"  (1.575 m), weight 58.8 kg, SpO2 95 %.  PHYSICAL EXAMINATION:   Physical Exam  GENERAL:  84 y.o.-year-old patient lying in the bed with no acute distress.  EYES: Pupils equal, round, reactive to light and accommodation. No scleral icterus. Extraocular  muscles intact.  HEENT: Head atraumatic, normocephalic. Oropharynx and nasopharynx clear.  NECK:  Supple, no jugular venous distention. No thyroid enlargement, no tenderness.  LUNGS: Normal breath sounds bilaterally, no wheezing, rales, rhonchi. No use of accessory muscles of respiration.  CARDIOVASCULAR: S1, S2 normal. No murmurs, rubs, or gallops.  ABDOMEN: Soft, nontender, nondistended. Bowel sounds present. No organomegaly or mass.  EXTREMITIES: No cyanosis, clubbing or edema b/l.    NEUROLOGIC: Cranial nerves II through XII are intact. No focal Motor or sensory deficits b/l.   PSYCHIATRIC:  patient is alert  SKIN: No obvious rash, lesion, or ulcer.   LABORATORY PANEL:  CBC Recent Labs  Lab 07/08/18 0259  WBC 13.1*  HGB 11.8*  HCT 34.7*  PLT 254    Chemistries  Recent Labs  Lab 07/09/18 0354  NA 137  K 3.7  CL 107  CO2 22  GLUCOSE 111*  BUN 13  CREATININE 0.91  CALCIUM 7.9*  AST 17  ALT 12  ALKPHOS 59  BILITOT 0.8   Cardiac Enzymes No results for input(s): TROPONINI in the last 168 hours. RADIOLOGY:  Ct Pelvis Wo Contrast  Result Date: 07/08/2018 CLINICAL DATA:  Right hip and groin pain after a fall. EXAM: CT PELVIS WITHOUT CONTRAST TECHNIQUE: Multidetector CT imaging of the pelvis was performed following the standard protocol without intravenous contrast. COMPARISON:  Right hip radiographs 07/08/2018. CT abdomen and pelvis 12/19/2008 FINDINGS: Urinary Tract:  No abnormality visualized. Bowel:  Unremarkable visualized pelvic bowel loops. Vascular/Lymphatic: Aorto iliac calcifications. No significant lymphadenopathy. Reproductive: Uterus is surgically absent. Asymmetric enlargement of right ovary, measuring 4.6 cm maximal diameter. Left ovary is unremarkable. Other:  No free air or free  fluid in the pelvis. Musculoskeletal: There is an impacted subcapital fracture of the proximal right femur. Pelvis and sacrum appear otherwise intact. Degenerative changes in the lower  lumbar spine and hips. Small right hip effusion. IMPRESSION: Impacted subcapital fracture of the proximal right femur. No other fractures identified. Electronically Signed   By: Lucienne Capers M.D.   On: 07/08/2018 03:33   Dg Chest Portable 1 View  Result Date: 07/08/2018 CLINICAL DATA:  Preoperative EXAM: PORTABLE CHEST 1 VIEW COMPARISON:  05/09/2018 FINDINGS: Borderline heart size with normal pulmonary vascularity. Diffuse coarse interstitial pattern to the lungs likely representing fibrosis. No airspace disease or consolidation in the lungs. No blunting of costophrenic angles. No pneumothorax. Mediastinal contours appear intact. Calcification of the aorta. Degenerative changes in the spine and shoulders. IMPRESSION: Chronic interstitial fibrosis in the lungs. No evidence of active pulmonary disease. Aortic atherosclerosis. Electronically Signed   By: Lucienne Capers M.D.   On: 07/08/2018 04:06   Dg Hip Operative Unilat W Or W/o Pelvis Right  Result Date: 07/08/2018 CLINICAL DATA:  Operative right hip EXAM: OPERATIVE right HIP (WITH PELVIS IF PERFORMED) 7 VIEWS TECHNIQUE: Fluoroscopic spot image(s) were submitted for interpretation post-operatively. COMPARISON:  07/08/2018 FINDINGS: Seven low resolution intraoperative spot views of the right hip. Images were obtained during operative threaded screw fixation of right femoral neck fracture. IMPRESSION: Intraoperative fluoroscopic assistance provided during surgical fixation of right femoral neck fracture. Electronically Signed   By: Donavan Foil M.D.   On: 07/08/2018 20:21   Dg Hip Unilat  With Pelvis 2-3 Views Right  Result Date: 07/08/2018 CLINICAL DATA:  Extreme pain and right hip and groin region after a fall on 09/03 at 8 p.m. EXAM: DG HIP (WITH OR WITHOUT PELVIS) 2-3V RIGHT COMPARISON:  None. FINDINGS: Degenerative changes in the lower lumbar spine and hips. Pelvis appears intact. SI joints and symphysis pubis are not displaced. Evaluation of  right hip is limited due to rotation. There appears to be slight deformity of the subcapital femoral neck suggesting a possible impacted subcapital fracture. No displacement or dislocation. IMPRESSION: Degenerative changes in the lower lumbar spine and hips. Possible impacted subcapital fracture of the right femoral neck. No displacement or dislocation. Electronically Signed   By: Lucienne Capers M.D.   On: 07/08/2018 02:39   ASSESSMENT AND PLAN:   Mahlani Berninger  is a 84 y.o. female with a known history of MI (1993), CVA (2012), HTN p/w 1d Hx mechanical fall + R hip/buttock pain. Pt is reportedly active and reasonably independent at baseline. She resides at [place]. @~2000PM on 07/07/2018 evening, pt went to the bathroom, and then experienced a fall as she was coming out of the bathroom.   1. Acute Right Subcapital femur fracture s/p mechanical fall at [facility] -POD#1 -doing overall well -prn pain mes -PT evaluation noted  2.HTN -cont BB and ramipril  3.DVT prophylaxis lovenox  Case discussed with Care Management/Social Worker. Management plans discussed with the patient, family and they are in agreement.  CODE STATUS: full  DVT Prophylaxis: *lovenox  TOTAL TIME TAKING CARE OF THIS PATIENT: *30* minutes.  >50% time spent on counselling and coordination of care  POSSIBLE D/C IN *1-2* DAYS, DEPENDING ON CLINICAL CONDITION.  Note: This dictation was prepared with Dragon dictation along with smaller phrase technology. Any transcriptional errors that result from this process are unintentional.  Fritzi Mandes M.D on 07/09/2018 at 6:22 PM  Between 7am to 6pm - Pager - [phone]  After 6pm  go to [email or web address] - password [email or web address] [facility]  Office  [phone]  CC: Primary care physician; Baxter Hire, MDPatient ID: Roni Bread, female   DOB: December 13, 1923, 84 y.o.   MRN: [id number]

## 2018-07-09 NOTE — Evaluation (Signed)
Physical Therapy Evaluation Patient Details Name: Michele [name] MRN: [id number] DOB: 1924-06-11 Today's Date: 07/09/2018   History of Present Illness  84yo female s/p R hip pinning (9/4) after hip fx. PMHx including LBBB, MI, CVA.  Clinical Impression  Pt did relatively well with first PT visit since ORIF.  She has chronic R knee pain that she statis is as bad or worse than R hip.  She was eager to do all she could and did relatively well with mobility, ambulation and with ~12 minutes of exercises apart from the exam.  She was able to maintain Point with some cuing, but showed poor gait pattern (apparently not great at baseline anyway) with R LE trailing behind and dragging.  Pt eager to go home, but will benefit from STR before doing so.      Follow Up Recommendations SNF    Equipment Recommendations  None recommended by PT    Recommendations for Other Services       Precautions / Restrictions Precautions Precautions: Fall Precaution Comments: RLE PWBing 50% Restrictions Weight Bearing Restrictions: Yes RLE Weight Bearing: Partial weight bearing RLE Partial Weight Bearing Percentage or Pounds: 50      Mobility  Bed Mobility Overal bed mobility: Modified Independent             General bed mobility comments: Pt showed good effort in getting up to sitting, needed rails and some cuing but no direct assist  Transfers Overall transfer level: Needs assistance Equipment used: Rolling walker (2 wheeled) Transfers: Sit to/from Stand Sit to Stand: Min assist         General transfer comment: Pt unable to rise on first attempt, PT instructed on positioning and sequencing, able ot rise with only very light assist   Ambulation/Gait Ambulation/Gait assistance: Min assist Gait Distance (Feet): 20 Feet Assistive device: Rolling walker (2 wheeled)       General Gait Details: Apparently pt normally somewhat drags R LE behind with ambulation, appears to be near baseline in this  sense.  She is much more reliant on the walker than normal and needed cues to insure PWBing on R.  Pt showed good effort but fatigued very quickly and would have struggled to do much more than the brief bout this session.   Stairs            Wheelchair Mobility    Modified Rankin (Stroke Patients Only)       Balance Overall balance assessment: Needs assistance Sitting-balance support: Feet supported;Feet unsupported Sitting balance-Leahy Scale: Good Sitting balance - Comments: Pt able to maintain sitting balance with good confidence   Standing balance support: Bilateral upper extremity supported Standing balance-Leahy Scale: Fair Standing balance comment: Pt is highly reliant on the walker, poor control with R LE                             Pertinent Vitals/Pain Pain Assessment: 0-10 Pain Score: 3  Pain Location: reports R OA pain is worse than R hip/sx pain Pain Descriptors / Indicators: Aching Pain Intervention(s): Limited activity within patient's tolerance;Monitored during session;Repositioned    Home Living Family/patient expects to be discharged to:: Skilled nursing facility Living Arrangements: Alone Available Help at Discharge: Available 24 hours/day Type of Home: Independent living facility         Home Equipment: Kasandra Knudsen - single point;Walker - 2 wheels;Wheelchair - manual      Prior Function Level of Independence: Needs assistance  Gait / Transfers Assistance Needed: Pt generally does well with limited distance mobility using AD  ADL's / Homemaking Assistance Needed: Pt's friend assists with chores as needed        Hand Dominance   Dominant Hand: Right    Extremity/Trunk Assessment   Upper Extremity Assessment Upper Extremity Assessment: Defer to OT evaluation    Lower Extremity Assessment Lower Extremity Assessment: Generalized weakness(grossly 3-/5 in R hip/knee, otherwise 3+/5 t/o) RLE: Unable to fully assess due to pain        Communication   Communication: HOH  Cognition Arousal/Alertness: Awake/alert Behavior During Therapy: WFL for tasks assessed/performed Overall Cognitive Status: Within Functional Limits for tasks assessed                                 General Comments: needs some continued cueing for RLE PWBing       General Comments      Exercises General Exercises - Lower Extremity Ankle Circles/Pumps: AROM;10 reps Quad Sets: Strengthening;10 reps Gluteal Sets: Strengthening;10 reps Heel Slides: AAROM;5 reps Hip ABduction/ADduction: AROM;10 reps    Assessment/Plan    PT Assessment Patient needs continued PT services  PT Problem List Decreased strength;Decreased range of motion;Decreased activity tolerance;Decreased balance;Decreased mobility;Decreased cognition;Decreased knowledge of use of DME;Decreased safety awareness;Pain       PT Treatment Interventions DME instruction;Gait training;Stair training;Functional mobility training;Therapeutic activities;Therapeutic exercise;Balance training;Neuromuscular re-education;Patient/family education    PT Goals (Current goals can be found in the Care Plan section)  Acute Rehab PT Goals Patient Stated Goal: motivated to get back home PT Goal Formulation: With patient Time For Goal Achievement: 07/23/18 Potential to Achieve Goals: Good    Frequency BID   Barriers to discharge        Co-evaluation               AM-PAC PT "6 Clicks" Daily Activity  Outcome Measure Difficulty turning over in bed (including adjusting bedclothes, sheets and blankets)?: A Little Difficulty moving from lying on back to sitting on the side of the bed? : A Little Difficulty sitting down on and standing up from a chair with arms (e.g., wheelchair, bedside commode, etc,.)?: Unable Help needed moving to and from a bed to chair (including a wheelchair)?: A Little Help needed walking in hospital room?: A Lot Help needed climbing 3-5 steps  with a railing? : A Lot 6 Click Score: 14    End of Session Equipment Utilized During Treatment: Gait belt Activity Tolerance: Patient limited by fatigue Patient left: with chair alarm set;with call bell/phone within reach;with family/visitor present   PT Visit Diagnosis: Muscle weakness (generalized) (M62.81);Difficulty in walking, not elsewhere classified (R26.2)    Time: 8177-1165 PT Time Calculation (min) (ACUTE ONLY): 33 min   Charges:   PT Evaluation $PT Eval Low Complexity: 1 Low PT Treatments $Therapeutic Exercise: 8-22 mins        Kreg Shropshire, DPT 07/09/2018, 1:07 PM

## 2018-07-09 NOTE — Progress Notes (Signed)
  Subjective: 1 Day Post-Op Procedure(s) (LRB): CANNULATED HIP PINNING (Right) Patient reports pain as mild.   Patient seen in rounds with Dr. Posey Pronto. Patient is with no specific complaints. Plan is to go Rehab after hospital stay. Negative for chest pain and shortness of breath Fever: no Gastrointestinal: Negative for nausea and vomiting  Objective: Vital signs in last 24 hours: Temp:  [97.8 F (36.6 C)-99.3 F (37.4 C)] 98.7 F (37.1 C) (09/05 0230) Pulse Rate:  [69-85] 69 (09/05 0230) Resp:  [16-20] 19 (09/05 0230) BP: (99-150)/(47-89) 103/47 (09/05 0230) SpO2:  [88 %-100 %] 97 % (09/05 0230)  Intake/Output from previous day:  Intake/Output Summary (Last 24 hours) at 07/09/2018 0647 Last data filed at 07/09/2018 0055 Gross per 24 hour  Intake 1022.35 ml  Output 1200 ml  Net -177.65 ml    Intake/Output this shift: Total I/O In: 1022.4 [I.V.:1022.4] Out: 350 [Urine:250; Blood:100]  Labs: Recent Labs    07/08/18 0259  HGB 11.8*   Recent Labs    07/08/18 0259  WBC 13.1*  RBC 3.98  HCT 34.7*  PLT 254   Recent Labs    07/08/18 0259 07/09/18 0354  NA 136 137  K 3.9 3.7  CL 105 107  CO2 25 22  BUN 13 13  CREATININE 1.01* 0.91  GLUCOSE 112* 111*  CALCIUM 8.9 7.9*   Recent Labs    07/08/18 0259  INR 1.11     EXAM General - Patient is Alert and Oriented Extremity - Sensation intact distally Dorsiflexion/Plantar flexion intact Compartment soft Dressing/Incision - clean, dry, no drainage Motor Function - intact, moving foot and toes well on exam.   Past Medical History:  Diagnosis Date  . LBBB (left bundle branch block)   . MI (myocardial infarction) ([place])   . Stroke [facility])     Assessment/Plan: 1 Day Post-Op Procedure(s) (LRB): CANNULATED HIP PINNING (Right) Active Problems:   Closed subcapital fracture of neck of right femur, initial encounter ([place])  Estimated body mass index is 23.71 kg/m as calculated from the following:   Height as of  this encounter: 5\' 2"  (1.575 m).   Weight as of this encounter: 58.8 kg. Advance diet Up with therapy D/C IV fluids  Discharge to rehab when cleared medically. Follow-up at [facility] clinic in 2 weeks.  DVT Prophylaxis - Lovenox, Foot Pumps and TED hose Partial weight-Bearing to right leg  Reche Dixon, PA-C Orthopaedic Surgery 07/09/2018, 6:47 AM

## 2018-07-09 NOTE — Progress Notes (Signed)
Clinical Education officer, museum (CSW) presented bed offers to patient and she chose [facility]. Patient's daughter Edd Fabian is agreeable to [facility]. Patient and her daughter are agreeable for patient to share a room and be put on the list for a private room when one opens up at [facility]. Per [facility] admissions coordinator at [facility] she will start [facility] SNF authorization today.   [employer], LCSW [phone]

## 2018-07-10 LAB — COMPREHENSIVE METABOLIC PANEL
ALT: 11 U/L (ref 0–44)
AST: 20 U/L (ref 15–41)
Albumin: 2.8 g/dL — ABNORMAL LOW (ref 3.5–5.0)
Alkaline Phosphatase: 59 U/L (ref 38–126)
Anion gap: 5 (ref 5–15)
BUN: 20 mg/dL (ref 8–23)
CHLORIDE: 107 mmol/L (ref 98–111)
CO2: 25 mmol/L (ref 22–32)
Calcium: 8.2 mg/dL — ABNORMAL LOW (ref 8.9–10.3)
Creatinine, Ser: 0.88 mg/dL (ref 0.44–1.00)
GFR, EST NON AFRICAN AMERICAN: 55 mL/min — AB (ref >60)
Glucose, Bld: 105 mg/dL — ABNORMAL HIGH (ref 70–99)
POTASSIUM: 3.5 mmol/L (ref 3.5–5.1)
Sodium: 137 mmol/L (ref 135–145)
Total Bilirubin: 0.7 mg/dL (ref 0.3–1.2)
Total Protein: 5.1 g/dL — ABNORMAL LOW (ref 6.5–8.1)

## 2018-07-10 MED ORDER — METHOCARBAMOL 500 MG PO TABS: 500.0000 mg | ORAL_TABLET | Freq: Four times a day (QID) | ORAL | 0 refills | Status: DC | PRN

## 2018-07-10 MED ORDER — TRAMADOL HCL 50 MG PO TABS: 50.0000 mg | ORAL_TABLET | Freq: Four times a day (QID) | ORAL | 1 refills | Status: DC | PRN

## 2018-07-10 MED ORDER — ENOXAPARIN SODIUM 30 MG/0.3ML ~~LOC~~ SOLN: 30.0000 mg | SUBCUTANEOUS | 0 refills | Status: DC

## 2018-07-10 MED ORDER — ENSURE ENLIVE PO LIQD: 237.0000 mL | Freq: Two times a day (BID) | ORAL | 12 refills | Status: DC

## 2018-07-10 MED ORDER — ADULT MULTIVITAMIN W/MINERALS CH: 1.0000 | ORAL_TABLET | Freq: Every day | ORAL | 0 refills | Status: DC

## 2018-07-10 MED ORDER — OXYCODONE HCL 5 MG PO TABS: 2.5000 mg | ORAL_TABLET | ORAL | 0 refills | Status: DC | PRN

## 2018-07-10 MED ORDER — SENNOSIDES-DOCUSATE SODIUM 8.6-50 MG PO TABS: 1.0000 | ORAL_TABLET | Freq: Every evening | ORAL | 0 refills | Status: DC | PRN

## 2018-07-10 NOTE — Progress Notes (Signed)
Occupational Therapy Treatment Patient Details Name: CINDERELLA CHRISTOFFERSEN MRN: [id number] DOB: [birth year] Today's Date: 07/10/2018    History of present illness 84 y. o. female s/p R cannulated hip pinning (9/4) after hip fx. PMHx includes: LBBB, MI, CVA.   OT comments  Pt. education was provided about LE dressing skills. Pt. attempted reacher use for general reacher training, and for LE dressing tasks. Pt. Required cues for reacher, and sockaide technique. Pt. continues to benefit from OT services for ADL training, A/E training, and pt. education about home modification, and DME. Pt. would benefit from SNF level of care upon discharge. Pt. could benefit from follow-up OT services at discharge. Pt. reports that she is planning to go to [facility] for rehab.   Follow Up Recommendations  SNF    Equipment Recommendations  3 in 1 bedside commode    Recommendations for Other Services      Precautions / Restrictions Precautions Precautions: Fall Precaution Comments: RLE PWBing 50% Restrictions Weight Bearing Restrictions: Yes RLE Weight Bearing: Partial weight bearing RLE Partial Weight Bearing Percentage or Pounds: 50       Mobility Bed Mobility Overal bed mobility: Modified Independent                Transfers Overall transfer level: Modified independent Equipment used: Rolling walker (2 wheeled) Transfers: Sit to/from Stand Sit to Stand: Min guard              Balance                                           ADL either performed or assessed with clinical judgement   ADL Overall ADL's : Needs assistance/impaired Eating/Feeding: Sitting;Independent   Grooming: Sitting;Independent   Upper Body Bathing: Set up;Min guard   Lower Body Bathing: Moderate assistance   Upper Body Dressing : Min guard   Lower Body Dressing: Moderate assistance               Functional mobility during ADLs: Min guard General ADL Comments: Pt. education was  provided about A/E use for LE ADLs.     Vision       Perception     Praxis      Cognition Arousal/Alertness: Awake/alert Behavior During Therapy: WFL for tasks assessed/performed Overall Cognitive Status: Within Functional Limits for tasks assessed                                          Exercises     Shoulder Instructions       General Comments      Pertinent Vitals/ Pain       Pain Assessment: No/denies pain Pain Score: 0-No pain Pain Descriptors / Indicators: Aching Pain Intervention(s): Limited activity within patient's tolerance;Monitored during session;Repositioned  Home Living                                          Prior Functioning/Environment              Frequency  Min 1X/week        Progress Toward Goals  OT Goals(current goals can now be found in the care plan section)  Acute Rehab OT Goals Patient Stated Goal: motivated to retrun to [facility] OT Goal Formulation: With patient Time For Goal Achievement: 07/23/18 Potential to Achieve Goals: Good  Plan      Co-evaluation                 AM-PAC PT "6 Clicks" Daily Activity     Outcome Measure   Help from another person eating meals?: None Help from another person taking care of personal grooming?: None Help from another person toileting, which includes using toliet, bedpan, or urinal?: A Little Help from another person bathing (including washing, rinsing, drying)?: A Lot Help from another person to put on and taking off regular upper body clothing?: A Little Help from another person to put on and taking off regular lower body clothing?: A Lot 6 Click Score: 18    End of Session Equipment Utilized During Treatment: Gait belt;Rolling walker  OT Visit Diagnosis: Other abnormalities of gait and mobility (R26.89);Muscle weakness (generalized) (M62.81);Pain Pain - Right/Left: Right Pain - part of body: Hip   Activity Tolerance Patient  tolerated treatment well   Patient Left in chair;with call bell/phone within reach;with chair alarm set;with family/visitor present   Nurse Communication          Time: 1001-1026 OT Time Calculation (min): 25 min  Charges: OT General Charges $OT Visit: 1 Visit OT Treatments $Self Care/Home Management : 23-37 mins  Harrel Carina, MS, OTR/L  Harrel Carina 07/10/2018, 11:11 AM

## 2018-07-10 NOTE — Progress Notes (Signed)
Physical Therapy Treatment Patient Details Name: Michele [name] MRN: [id number] DOB: 1924/06/25 Today's Date: 07/10/2018    History of Present Illness 84 y. o. female s/p R cannulated hip pinning (9/4) after hip fx. PMHx includes: LBBB, MI, CVA.    PT Comments    Pt did well with PT session today and was neither pain limited nor highly fatigued with the effort.  She showed good bed mobility and confidence with transfer and though gait was still somewhat slow and limited she did go over 100 ft again with good effort and general safety.   Follow Up Recommendations  SNF     Equipment Recommendations  None recommended by PT    Recommendations for Other Services       Precautions / Restrictions Precautions Precautions: Fall Precaution Comments: RLE PWBing 50% Restrictions Weight Bearing Restrictions: Yes RLE Weight Bearing: Partial weight bearing RLE Partial Weight Bearing Percentage or Pounds: 50    Mobility  Bed Mobility Overal bed mobility: Modified Independent             General bed mobility comments: Pt slow but independent getting to EOB  Transfers Overall transfer level: Modified independent Equipment used: Rolling walker (2 wheeled) Transfers: Sit to/from Stand Sit to Stand: Min guard         General transfer comment: Pt needed light reminders for UE use and sequencing but rose w/o assist  Ambulation/Gait Ambulation/Gait assistance: Min guard Gait Distance (Feet): 125 Feet Assistive device: Rolling walker (2 wheeled)       General Gait Details: Pt continues to have R toe out with poor clearance, but ultimately had good safety and confidence with ambulation.  Increased speed and confidence today, seemed able to maintain [place].   Stairs             Wheelchair Mobility    Modified Rankin (Stroke Patients Only)       Balance Overall balance assessment: Needs assistance Sitting-balance support: Feet supported;Feet unsupported Sitting  balance-Leahy Scale: Good Sitting balance - Comments: Pt able to maintain sitting balance with good confidence   Standing balance support: Bilateral upper extremity supported Standing balance-Leahy Scale: Fair Standing balance comment: Pt is reliant on the walker but stable in standing                            Cognition Arousal/Alertness: Awake/alert Behavior During Therapy: WFL for tasks assessed/performed Overall Cognitive Status: Within Functional Limits for tasks assessed                                        Exercises General Exercises - Lower Extremity Ankle Circles/Pumps: AROM;10 reps Quad Sets: Strengthening;10 reps Gluteal Sets: Strengthening;10 reps Short Arc Quad: Strengthening;10 reps Heel Slides: AROM;10 reps Hip ABduction/ADduction: AROM;10 reps    General Comments        Pertinent Vitals/Pain Pain Assessment: (continues to c/o knee more than hip pain, very managable) Pain Score: 0-No pain Pain Descriptors / Indicators: Aching Pain Intervention(s): Limited activity within patient's tolerance;Monitored during session;Repositioned    Home Living                      Prior Function            PT Goals (current goals can now be found in the care plan section) Acute Rehab PT Goals Patient  Stated Goal: motivated to retrun to [facility] Progress towards PT goals: Progressing toward goals    Frequency    BID      PT Plan Current plan remains appropriate    Co-evaluation              AM-PAC PT "6 Clicks" Daily Activity  Outcome Measure  Difficulty turning over in bed (including adjusting bedclothes, sheets and blankets)?: A Little Difficulty moving from lying on back to sitting on the side of the bed? : A Little Difficulty sitting down on and standing up from a chair with arms (e.g., wheelchair, bedside commode, etc,.)?: A Little Help needed moving to and from a bed to chair (including a wheelchair)?:  None Help needed walking in hospital room?: A Little Help needed climbing 3-5 steps with a railing? : A Little 6 Click Score: 19    End of Session Equipment Utilized During Treatment: Gait belt Activity Tolerance: Patient limited by fatigue Patient left: with chair alarm set;with call bell/phone within reach;with family/visitor present Nurse Communication: Mobility status PT Visit Diagnosis: Muscle weakness (generalized) (M62.81);Difficulty in walking, not elsewhere classified (R26.2)     Time: 6237-6283 PT Time Calculation (min) (ACUTE ONLY): 29 min  Charges:  $Gait Training: 8-22 mins $Therapeutic Exercise: 8-22 mins                     Kreg Shropshire, DPT 07/10/2018, 11:53 AM

## 2018-07-10 NOTE — Progress Notes (Signed)
Pt ready for discharge to SNF per MD. Pt assessment unchanged from this morning. Report called to [place] at [facility]; all questions answered and discharge instructions reviewed. Pt's granddaughter wanted to transfer pt to facility instead of EMS. Pt belongings packed and sent with pt. Assisted to car via NT.  Ethelda Chick

## 2018-07-10 NOTE — Clinical Social Work Placement (Addendum)
   CLINICAL SOCIAL WORK PLACEMENT  NOTE  Date:  07/10/2018  Patient Details  Name: Michele [name] MRN: [id number] Date of Birth: April 27, 1924  Clinical Social Work is seeking post-discharge placement for this patient at the [place] level of care (*CSW will initial, date and re-position this form in  chart as items are completed):  Yes   Patient/family provided with [place] Work Department's list of facilities offering this level of care within the geographic area requested by the patient (or if unable, by the patient's family).  Yes   Patient/family informed of their freedom to choose among providers that offer the needed level of care, that participate in Medicare, Medicaid or managed care program needed by the patient, have an available bed and are willing to accept the patient.  Yes   Patient/family informed of [facility]'s ownership interest in [facility] and [facility], as well as of the fact that they are under no obligation to receive care at these facilities.  PASRR submitted to EDS on 07/08/18     PASRR number received on 07/08/18     Existing PASRR number confirmed on       FL2 transmitted to all facilities in geographic area requested by pt/family on 07/08/18     FL2 transmitted to all facilities within larger geographic area on       Patient informed that his/her managed care company has contracts with or will negotiate with certain facilities, including the following:        Yes   Patient/family informed of bed offers received.  Patient chooses bed at [facility] )     Physician recommends and patient chooses bed at      Patient to be transferred to [facility] ) on 07/10/18.  Patient to be transferred to facility by Per RN patient's granddaughter will transport.    Patient family notified on 07/10/18 of transfer.  Name of family member notified:  (Patient's daughter Edd Fabian is aware of D/C today. )      PHYSICIAN       Additional Comment:    _______________________________________________ Wei Newbrough, Veronia Beets, LCSW 07/10/2018, 11:14 AM

## 2018-07-10 NOTE — Progress Notes (Addendum)
Patient is medically stable for D/C to [facility] today. Per [facility] admissions coordinator at [facility] authorization has been received and patient can come today to room 207-B. RN will call report at [phone] and arrange EMS for transport. Clinical Education officer, museum (CSW) sent D/C orders to [employer] via [employer]. Patient is aware of above. CSW contacted patient's daughter Edd Fabian and made her aware of above. Please reconsult if future social work needs arise. CSW signing off.   Per RN patient's granddaughter will transport.   [employer], LCSW [phone]

## 2018-07-10 NOTE — Progress Notes (Signed)
  Subjective: 2 Days Post-Op Procedure(s) (LRB): CANNULATED HIP PINNING (Right) Patient reports pain as mild.   Patient seen in rounds with Dr. Posey Pronto. Patient is with no specific complaints. Plan is to go Rehab after hospital stay. Negative for chest pain and shortness of breath Fever: no Gastrointestinal: Negative for nausea and vomiting  Objective: Vital signs in last 24 hours: Temp:  [97.6 F (36.4 C)-99.1 F (37.3 C)] 98.6 F (37 C) (09/05 2338) Pulse Rate:  [74-76] 75 (09/05 2338) Resp:  [18] 18 (09/05 2338) BP: (119-134)/(53-66) 123/66 (09/05 2338) SpO2:  [92 %-95 %] 92 % (09/05 2338)  Intake/Output from previous day:  Intake/Output Summary (Last 24 hours) at 07/10/2018 0619 Last data filed at 07/10/2018 0503 Gross per 24 hour  Intake 851.05 ml  Output 200 ml  Net 651.05 ml    Intake/Output this shift: No intake/output data recorded.  Labs: Recent Labs    07/08/18 0259  HGB 11.8*   Recent Labs    07/08/18 0259  WBC 13.1*  RBC 3.98  HCT 34.7*  PLT 254   Recent Labs    07/09/18 0354 07/10/18 0456  NA 137 137  K 3.7 3.5  CL 107 107  CO2 22 25  BUN 13 20  CREATININE 0.91 0.88  GLUCOSE 111* 105*  CALCIUM 7.9* 8.2*   Recent Labs    07/08/18 0259  INR 1.11     EXAM General - Patient is Alert and Oriented Extremity - Sensation intact distally Dorsiflexion/Plantar flexion intact Compartment soft Dressing/Incision - clean, dry, scant blood-tinged drainage Motor Function - intact, moving foot and toes well on exam.   Past Medical History:  Diagnosis Date  . LBBB (left bundle branch block)   . MI (myocardial infarction) ([place])   . Stroke [facility])     Assessment/Plan: 2 Days Post-Op Procedure(s) (LRB): CANNULATED HIP PINNING (Right) Active Problems:   Closed subcapital fracture of neck of right femur, initial encounter ([place])  Estimated body mass index is 23.71 kg/m as calculated from the following:   Height as of this encounter: 5\' 2"  (1.575  m).   Weight as of this encounter: 58.8 kg. Advance diet Up with therapy D/C IV fluids  Discharge to rehab when cleared medically.  Possible discharge today. Follow-up at [facility] clinic in 2 weeks.  DVT Prophylaxis - Lovenox, Foot Pumps and TED hose Partial weight-Bearing to right leg  Reche Dixon, PA-C Orthopaedic Surgery 07/10/2018, 6:19 AM

## 2018-07-10 NOTE — Discharge Summary (Signed)
Michele [name], is a 84 y.o. female  DOB 1924/05/05  MRN [id number].  Admission date:  07/08/2018  Admitting Physician  Arta Silence, MD  Discharge Date:  07/10/2018   Primary MD  Baxter Hire, MD  Recommendations for primary care physician for things to follow:   Follow-up with [facility] orthopedic in 2 weeks for suture removal  follow-up with PCP in 10 days.   Admission Diagnosis  Closed subcapital fracture of right femur, initial encounter [facility]) [S72.011A]   Discharge Diagnosis  Closed subcapital fracture of right femur, initial encounter ([place]) [S72.011A]    Active Problems:   Closed subcapital fracture of neck of right femur, initial encounter [facility])      Past Medical History:  Diagnosis Date  . LBBB (left bundle branch block)   . MI (myocardial infarction) ([place])   . Stroke [facility])     Past Surgical History:  Procedure Laterality Date  . ABDOMINAL HYSTERECTOMY    . APPENDECTOMY    . HIP PINNING,CANNULATED Right 07/08/2018   Procedure: CANNULATED HIP PINNING;  Surgeon: Leim Fabry, MD;  Location: [facility] ORS;  Service: Orthopedics;  Laterality: Right;  . ROTATOR CUFF REPAIR Left        History of present illness and  Hospital Course:     Kindly see H&P for history of present illness and admission details, please review complete Labs, Consult reports and Test reports for all details in brief  HPI  from the history and physical done on the day of admission 84 year old female patient with history of for MI, CVA, essential hypertension had a fall at [facility] independent living facility, found to have right hip fracture, admitted for the same.   Hospital Course  Acute right ear chronic fractures status post repair by orthopedic on September 4 with pinning of right humeral neck, postoperatively patient did well,  participating in physical therapy, patient will be discharged to [facility] rehab today, continue Lovenox 40 mg daily for 2 weeks .  Continue narcotic pain medicines as required.  Need to follow-up with the [facility] clinic orthopedic in 2 weeks.  #2/essential hypertension: Controlled, continue beta-blocker, ramipril. #3 constipation: Use laxative as needed. Discharge Condition: Stable   Follow UP   Contact information for follow-up providers    Leim Fabry, MD Follow up in 2 week(s).   Specialty:  Orthopedic Surgery Why:  For staple removal Contact information: [place] [address] [phone]            Contact information for after-discharge care    Destination    [facility] .   Service:  Skilled Nursing Contact information: [address] [place] [phone]                    Discharge Instructions  and  Discharge Medications      Allergies as of 07/10/2018      Reactions   Penicillins Rash   Has patient had a PCN reaction causing immediate rash, facial/tongue/throat swelling, SOB or lightheadedness with hypotension: Yes Has patient had a PCN reaction causing severe rash involving mucus membranes or skin necrosis: No Has patient had a PCN reaction that required hospitalization: No Has patient had a PCN reaction occurring within the last 10 years: No If all of the above answers are "NO", then may proceed with Cephalosporin use.      Medication List    TAKE these medications   ALPRAZolam 0.25 MG tablet  Commonly known as:  XANAX Take 1 tablet (0.25 mg total) by mouth at bedtime as needed for anxiety.   aspirin 325 MG tablet Take 325 mg by mouth daily.   busPIRone 5 MG tablet Commonly known as:  BUSPAR Take 5 mg by mouth 2 (two) times daily.   diclofenac sodium 1 % Gel Commonly known as:  VOLTAREN Apply 4 g topically 4 (four) times daily as needed (pain).   enoxaparin  30 MG/0.3ML injection Commonly known as:  LOVENOX Inject 0.3 mLs (30 mg total) into the skin daily.   escitalopram 5 MG tablet Commonly known as:  LEXAPRO Take 5 mg by mouth daily.   feeding supplement (ENSURE ENLIVE) Liqd Take 237 mLs by mouth 2 (two) times daily between meals.   furosemide 20 MG tablet Commonly known as:  LASIX Take 20 mg by mouth every other day.   HYDROcodone-acetaminophen 5-325 MG tablet Commonly known as:  NORCO/VICODIN Take  to 1 tablet by mouth 3 times daily as needed for severe pain   methocarbamol 500 MG tablet Commonly known as:  ROBAXIN Take 1 tablet (500 mg total) by mouth every 6 (six) hours as needed for muscle spasms.   metoprolol succinate 25 MG 24 hr tablet Commonly known as:  TOPROL-XL Take 25 mg by mouth daily.   multivitamin with minerals Tabs tablet Take 1 tablet by mouth daily. Start taking on:  07/11/2018   oxyCODONE 5 MG immediate release tablet Commonly known as:  Oxy IR/ROXICODONE Take 0.5-1 tablets (2.5-5 mg total) by mouth every 4 (four) hours as needed for moderate pain or severe pain (pain score 4-6).   ramipril 10 MG capsule Commonly known as:  ALTACE Take 10 mg by mouth 2 (two) times daily.   senna-docusate 8.6-50 MG tablet Commonly known as:  Senokot-S Take 1 tablet by mouth at bedtime as needed for mild constipation.   traMADol 50 MG tablet Commonly known as:  ULTRAM Take 1 tablet (50 mg total) by mouth every 6 (six) hours as needed for moderate pain.         Diet and Activity recommendation: See Discharge Instructions above   Consults obtained -orthopedic   Major procedures and Radiology Reports - PLEASE review detailed and final reports for all details, in brief -     Ct Pelvis Wo Contrast  Result Date: 07/08/2018 CLINICAL DATA:  Right hip and groin pain after a fall. EXAM: CT PELVIS WITHOUT CONTRAST TECHNIQUE: Multidetector CT imaging of the pelvis was performed following the standard protocol without  intravenous contrast. COMPARISON:  Right hip radiographs 07/08/2018. CT abdomen and pelvis 12/19/2008 FINDINGS: Urinary Tract:  No abnormality visualized. Bowel:  Unremarkable visualized pelvic bowel loops. Vascular/Lymphatic: Aorto iliac calcifications. No significant lymphadenopathy. Reproductive: Uterus is surgically absent. Asymmetric enlargement of right ovary, measuring 4.6 cm maximal diameter. Left ovary is unremarkable. Other:  No free air or free fluid in the pelvis. Musculoskeletal: There is an impacted subcapital fracture of the proximal right femur. Pelvis and sacrum appear otherwise intact. Degenerative changes in the lower lumbar spine and hips. Small right hip effusion. IMPRESSION: Impacted subcapital fracture of the proximal right femur. No other fractures identified. Electronically Signed   By: Lucienne Capers M.D.   On: 07/08/2018 03:33   Dg Chest Portable 1 View  Result Date: 07/08/2018 CLINICAL DATA:  Preoperative EXAM: PORTABLE CHEST 1 VIEW COMPARISON:  05/09/2018 FINDINGS: Borderline heart size with normal pulmonary vascularity. Diffuse coarse interstitial pattern to the lungs likely representing fibrosis. No airspace disease  or consolidation in the lungs. No blunting of costophrenic angles. No pneumothorax. Mediastinal contours appear intact. Calcification of the aorta. Degenerative changes in the spine and shoulders. IMPRESSION: Chronic interstitial fibrosis in the lungs. No evidence of active pulmonary disease. Aortic atherosclerosis. Electronically Signed   By: Lucienne Capers M.D.   On: 07/08/2018 04:06   Dg Hip Operative Unilat W Or W/o Pelvis Right  Result Date: 07/08/2018 CLINICAL DATA:  Operative right hip EXAM: OPERATIVE right HIP (WITH PELVIS IF PERFORMED) 7 VIEWS TECHNIQUE: Fluoroscopic spot image(s) were submitted for interpretation post-operatively. COMPARISON:  07/08/2018 FINDINGS: Seven low resolution intraoperative spot views of the right hip. Images were obtained  during operative threaded screw fixation of right femoral neck fracture. IMPRESSION: Intraoperative fluoroscopic assistance provided during surgical fixation of right femoral neck fracture. Electronically Signed   By: Donavan Foil M.D.   On: 07/08/2018 20:21   Dg Hip Unilat  With Pelvis 2-3 Views Right  Result Date: 07/08/2018 CLINICAL DATA:  Extreme pain and right hip and groin region after a fall on 09/03 at 8 p.m. EXAM: DG HIP (WITH OR WITHOUT PELVIS) 2-3V RIGHT COMPARISON:  None. FINDINGS: Degenerative changes in the lower lumbar spine and hips. Pelvis appears intact. SI joints and symphysis pubis are not displaced. Evaluation of right hip is limited due to rotation. There appears to be slight deformity of the subcapital femoral neck suggesting a possible impacted subcapital fracture. No displacement or dislocation. IMPRESSION: Degenerative changes in the lower lumbar spine and hips. Possible impacted subcapital fracture of the right femoral neck. No displacement or dislocation. Electronically Signed   By: Lucienne Capers M.D.   On: 07/08/2018 02:39    Micro Results    Recent Results (from the past 240 hour(s))  Surgical pcr screen     Status: None   Collection Time: 07/08/18  5:40 AM  Result Value Ref Range Status   MRSA, PCR NEGATIVE NEGATIVE Final   Staphylococcus aureus NEGATIVE NEGATIVE Final    Comment: (NOTE) The Xpert SA Assay (FDA approved for NASAL specimens in patients 12 years of age and older), is one component of a comprehensive surveillance program. It is not intended to diagnose infection nor to guide or monitor treatment. Performed at [facility], [address], [place], [place] [address]        Today   Subjective:   Jarrett Soho today has mild right hip pain controlled well w pain medicine.   Objective:   Blood pressure 131/62, pulse 68, temperature 98.8 F (37.1 C), temperature source Oral, resp. rate 18, height 5\' 2"  (1.575 m), weight 58.8 kg,  SpO2 95 %.   Intake/Output Summary (Last 24 hours) at 07/10/2018 1005 Last data filed at 07/10/2018 0503 Gross per 24 hour  Intake 851.05 ml  Output 0 ml  Net 851.05 ml    Exam Awake Alert, Oriented x 3, No new F.N deficits, Normal affect [place].AT,PERRAL Supple Neck,No JVD, No cervical lymphadenopathy appriciated.  Symmetrical Chest wall movement, Good air movement bilaterally, CTAB RRR,No Gallops,Rubs or new Murmurs, No Parasternal Heave +ve B.Sounds, Abd Soft, Non tender, No organomegaly appriciated, No rebound -guarding or rigidity. No Cyanosis, Clubbing or edema, No new Rash or bruise  Data Review   CBC w Diff:  Lab Results  Component Value Date   WBC 13.1 (H) 07/08/2018   HGB 11.8 (L) 07/08/2018   HCT 34.7 (L) 07/08/2018   PLT 254 07/08/2018   LYMPHOPCT 6 07/08/2018   MONOPCT 6 07/08/2018   EOSPCT  0 07/08/2018   BASOPCT 1 07/08/2018    CMP:  Lab Results  Component Value Date   NA 137 07/10/2018   K 3.5 07/10/2018   CL 107 07/10/2018   CO2 25 07/10/2018   BUN 20 07/10/2018   CREATININE 0.88 07/10/2018   PROT 5.1 (L) 07/10/2018   ALBUMIN 2.8 (L) 07/10/2018   BILITOT 0.7 07/10/2018   ALKPHOS 59 07/10/2018   AST 20 07/10/2018   ALT 11 07/10/2018  .   Total Time in preparing paper work, data evaluation and todays exam - 73 minutes  Epifanio Lesches M.D on 07/10/2018 at 10:05 AM    Note: This dictation was prepared with Dragon dictation along with smaller phrase technology. Any transcriptional errors that result from this process are unintentional.

## 2018-07-13 ENCOUNTER — Encounter: Payer: Self-pay | Admitting: Adult Health

## 2018-07-13 DIAGNOSIS — M810 Age-related osteoporosis without current pathological fracture: Secondary | ICD-10-CM | POA: Insufficient documentation

## 2018-07-13 NOTE — Progress Notes (Signed)
Opened in error; Disregard.

## 2018-07-14 ENCOUNTER — Other Ambulatory Visit: Payer: Self-pay

## 2018-07-14 MED ORDER — HYDROCODONE-ACETAMINOPHEN 5-325 MG PO TABS: 0.5000 | ORAL_TABLET | Freq: Three times a day (TID) | ORAL | 0 refills | Status: DC | PRN

## 2018-07-14 NOTE — Telephone Encounter (Signed)
Rx sent to [facility] phone : [phone] , fax : [phone]  

## 2018-07-15 NOTE — Anesthesia Postprocedure Evaluation (Signed)
Anesthesia Post Note  Patient: Michele [name]  Procedure(s) Performed: CANNULATED HIP PINNING (Right Hip)  Patient location during evaluation: PACU Anesthesia Type: General Level of consciousness: awake and alert and oriented Pain management: pain level controlled Vital Signs Assessment: post-procedure vital signs reviewed and stable Respiratory status: spontaneous breathing Cardiovascular status: blood pressure returned to baseline Anesthetic complications: no     Last Vitals:  Vitals:   07/10/18 0815 07/10/18 1243  BP: 131/62 92/79  Pulse: 68 63  Resp: 18 18  Temp: 37.1 C 36.6 C  SpO2: 95% 95%    Last Pain:  Vitals:   07/10/18 1243  TempSrc: Oral  PainSc:                  Albirta Rhinehart

## 2018-07-20 ENCOUNTER — Non-Acute Institutional Stay (SKILLED_NURSING_FACILITY): Payer: Medicare Other | Admitting: Adult Health

## 2018-07-20 ENCOUNTER — Encounter: Payer: Self-pay | Admitting: Adult Health

## 2018-07-20 DIAGNOSIS — I10 Essential (primary) hypertension: Secondary | ICD-10-CM

## 2018-07-20 DIAGNOSIS — S72011S Unspecified intracapsular fracture of right femur, sequela: Secondary | ICD-10-CM

## 2018-07-20 DIAGNOSIS — F339 Major depressive disorder, recurrent, unspecified: Secondary | ICD-10-CM | POA: Diagnosis not present

## 2018-07-20 DIAGNOSIS — F419 Anxiety disorder, unspecified: Secondary | ICD-10-CM | POA: Diagnosis not present

## 2018-07-20 DIAGNOSIS — G4700 Insomnia, unspecified: Secondary | ICD-10-CM

## 2018-07-20 DIAGNOSIS — Z8673 Personal history of transient ischemic attack (TIA), and cerebral infarction without residual deficits: Secondary | ICD-10-CM

## 2018-07-20 NOTE — Progress Notes (Signed)
Location:  [facility] Room Number: [place] of Service:  SNF (31) Provider:  Durenda Age, NP  Patient Care Team: Baxter Hire, MD as PCP - General (Internal Medicine)  Extended Emergency Contact Information Primary Emergency Contact: [facility] Address: [place] [address] Home Phone: [phone] Relation: Daughter Secondary Emergency Contact: clark,chris Mobile Phone: [phone] Relation: None  Code Status:  DNR  Goals of care: Advanced Directive information Advanced Directives 07/20/2018  Does Patient Have a Medical Advance Directive? Yes  Type of Advance Directive Out of facility DNR (pink MOST or yellow form)  Does patient want to make changes to medical advance directive? No - Patient declined  Copy of [place] in Chart? -  Would patient like information on creating a medical advance directive? -  Pre-existing out of facility DNR order (yellow form or pink MOST form) Yellow form placed in chart (order not valid for inpatient use)     Chief Complaint  Patient presents with  . Medical Management of Chronic Issues    Weekly follow-up for the First 30 days Post Hospitalization    HPI:  Pt is a 84 y.o. female seen today for a weekly follow-up post-hospitalization. She was seen in her room with sitter at bedside. She had just finished having therapy. She denies having pain on her right hip.  She has been admitted to [facility] on 07/10/18 post hospitalization for right femoral neck fracture S/P percutaneous pinning on 07/08/18. She has been admitted to [facility] for a short-term rehabilitation.   Past Medical History:  Diagnosis Date  . LBBB (left bundle branch block)   . MI (myocardial infarction) ([place])   . Stroke [facility])    Past Surgical History:  Procedure Laterality Date  . ABDOMINAL HYSTERECTOMY    . APPENDECTOMY    . HIP PINNING,CANNULATED Right 07/08/2018   Procedure: CANNULATED HIP PINNING;  Surgeon: Leim Fabry, MD;  Location: [facility] ORS;  Service: Orthopedics;  Laterality: Right;  . ROTATOR CUFF REPAIR Left     Allergies  Allergen Reactions  . Penicillins Rash    Has patient had a PCN reaction causing immediate rash, facial/tongue/throat swelling, SOB or lightheadedness with hypotension: Yes Has patient had a PCN reaction causing severe rash involving mucus membranes or skin necrosis: No Has patient had a PCN reaction that required hospitalization: No Has patient had a PCN reaction occurring within the last 10 years: No If all of the above answers are "NO", then may proceed with Cephalosporin use.      Review of Systems  GENERAL: No change in appetite, no fatigue, no weight changes, no fever, chills or weakness MOUTH and THROAT: Denies oral discomfort, gingival pain or bleeding RESPIRATORY: no cough, SOB, DOE, wheezing, hemoptysis CARDIAC: No chest pain, edema or palpitations GI: No abdominal pain, diarrhea, constipation, heart burn, nausea or vomiting GU: Denies dysuria, frequency, hematuria, incontinence, or discharge PSYCHIATRIC: Denies feelings of depression or anxiety. No report of hallucinations, insomnia, paranoia, or agitation   Immunization History  Administered Date(s) Administered  . Influenza Split 10/04/2016, 06/19/2017  . Influenza, High Dose Seasonal PF 06/19/2017  . Influenza, Seasonal, Injecte, Preservative Fre 07/13/2010  . Influenza-Unspecified 09/09/2012, 08/29/2014, 08/28/2015  . Pneumococcal Conjugate-13 07/23/2016  . Pneumococcal Polysaccharide-23 06/09/2012  . Zoster 11/05/2007   Pertinent  Health Maintenance Due  Topic Date Due  . DEXA SCAN  03/01/1991  . PNA vac Low Risk  Adult (1 of 2 - PCV13) 03/01/1991  . INFLUENZA VACCINE  06/04/2018       Vitals:   07/20/18 1326  BP: (!) 142/68  Pulse: 62  Resp: 18  Temp: 97.6 F (36.4 C)  TempSrc: Oral  SpO2: 98%  Weight: 122 lb 4.8 oz (55.5 kg)    Height: 5\' 2"  (1.575 m)   Body mass index is 22.37 kg/m.  Physical Exam  GENERAL APPEARANCE: Well nourished. In no acute distress. Normal body habitus SKIN:  Right hip surgical incision is covered with honey-combed dressing, no erythema, dry MOUTH and THROAT: Lips are without lesions. Oral mucosa is moist and without lesions. Tongue is normal in shape, size, and color and without lesions RESPIRATORY: Breathing is even & unlabored, BS CTAB CARDIAC: RRR, +murmur,no extra heart sounds, no edema GI: Abdomen soft, normal BS, no masses, no tenderness EXTREMITIES:  Able to move X 4 extremities NEUROLOGICAL: There is no tremor. Speech is clear PSYCHIATRIC: Alert to self and time, disoriented to place.  Affect and behavior are appropriate  Labs reviewed: Recent Labs    11/07/17 0547  07/08/18 0259 07/09/18 0354 07/10/18 0456  NA 140   < > 136 137 137  K 3.4*   < > 3.9 3.7 3.5  CL 108   < > 105 107 107  CO2 25   < > 25 22 25   GLUCOSE 119*   < > 112* 111* 105*  BUN <5*   < > 13 13 20   CREATININE 0.61   < > 1.01* 0.91 0.88  CALCIUM 9.1   < > 8.9 7.9* 8.2*  MG 1.6*  --   --   --   --    < > = values in this interval not displayed.   Recent Labs    07/09/18 0354 07/10/18 0456  AST 17 20  ALT 12 11  ALKPHOS 59 59  BILITOT 0.8 0.7  PROT 5.2* 5.1*  ALBUMIN 2.8* 2.8*   Recent Labs    11/09/17 0429 11/12/17 0644 07/08/18 0259  WBC 7.6 5.5 13.1*  NEUTROABS  --   --  11.3*  HGB 11.8* 12.6 11.8*  HCT 35.2 37.3 34.7*  MCV 88.4 88.5 87.2  PLT 384 364 254    Significant Diagnostic Results in last 30 days:  Ct Pelvis Wo Contrast  Result Date: 07/08/2018 CLINICAL DATA:  Right hip and groin pain after a fall. EXAM: CT PELVIS WITHOUT CONTRAST TECHNIQUE: Multidetector CT imaging of the pelvis was performed following the standard protocol without intravenous contrast. COMPARISON:  Right hip radiographs 07/08/2018. CT abdomen and pelvis 12/19/2008 FINDINGS: Urinary Tract:  No  abnormality visualized. Bowel:  Unremarkable visualized pelvic bowel loops. Vascular/Lymphatic: Aorto iliac calcifications. No significant lymphadenopathy. Reproductive: Uterus is surgically absent. Asymmetric enlargement of right ovary, measuring 4.6 cm maximal diameter. Left ovary is unremarkable. Other:  No free air or free fluid in the pelvis. Musculoskeletal: There is an impacted subcapital fracture of the proximal right femur. Pelvis and sacrum appear otherwise intact. Degenerative changes in the lower lumbar spine and hips. Small right hip effusion. IMPRESSION: Impacted subcapital fracture of the proximal right femur. No other fractures identified. Electronically Signed   By: Lucienne Capers M.D.   On: 07/08/2018 03:33   Dg Chest Portable 1 View  Result Date: 07/08/2018 CLINICAL DATA:  Preoperative EXAM: PORTABLE CHEST 1 VIEW COMPARISON:  05/09/2018 FINDINGS: Borderline heart size with normal pulmonary vascularity. Diffuse coarse interstitial pattern to the lungs likely representing fibrosis. No airspace  disease or consolidation in the lungs. No blunting of costophrenic angles. No pneumothorax. Mediastinal contours appear intact. Calcification of the aorta. Degenerative changes in the spine and shoulders. IMPRESSION: Chronic interstitial fibrosis in the lungs. No evidence of active pulmonary disease. Aortic atherosclerosis. Electronically Signed   By: Lucienne Capers M.D.   On: 07/08/2018 04:06   Dg Hip Operative Unilat W Or W/o Pelvis Right  Result Date: 07/08/2018 CLINICAL DATA:  Operative right hip EXAM: OPERATIVE right HIP (WITH PELVIS IF PERFORMED) 7 VIEWS TECHNIQUE: Fluoroscopic spot image(s) were submitted for interpretation post-operatively. COMPARISON:  07/08/2018 FINDINGS: Seven low resolution intraoperative spot views of the right hip. Images were obtained during operative threaded screw fixation of right femoral neck fracture. IMPRESSION: Intraoperative fluoroscopic assistance provided  during surgical fixation of right femoral neck fracture. Electronically Signed   By: Donavan Foil M.D.   On: 07/08/2018 20:21   Dg Hip Unilat  With Pelvis 2-3 Views Right  Result Date: 07/08/2018 CLINICAL DATA:  Extreme pain and right hip and groin region after a fall on 09/03 at 8 p.m. EXAM: DG HIP (WITH OR WITHOUT PELVIS) 2-3V RIGHT COMPARISON:  None. FINDINGS: Degenerative changes in the lower lumbar spine and hips. Pelvis appears intact. SI joints and symphysis pubis are not displaced. Evaluation of right hip is limited due to rotation. There appears to be slight deformity of the subcapital femoral neck suggesting a possible impacted subcapital fracture. No displacement or dislocation. IMPRESSION: Degenerative changes in the lower lumbar spine and hips. Possible impacted subcapital fracture of the right femoral neck. No displacement or dislocation. Electronically Signed   By: Lucienne Capers M.D.   On: 07/08/2018 02:39    Assessment/Plan  1. Closed subcapital fracture of right femur, sequela - S/P repair, continue Norco 5-325 mg 1 tab TID PRN, Oxycodone 5 mg 1/2-1 tab Q 4-6 hors PRN and Tramadol 50 mg 1 tab Q 6 hours PRN for pain, robaxin 500 mg 1 tab every 6 hours when necessary for muscle spasm, lovenox 30 mg subcutaneous daily for DVT prophylaxis to 07/25/18, follow-up with orthopedics, continue PT and OT for therapeutic strengthening exercises, fall precautions   2. Essential hypertension - well-controlled, continue metoprolol succinate ER 24 hour 1 tab daily, ramipril 10 mg 1 tab twice a day, and lasix 40 mg 1 tab daily   3. Anxiety - mood is stable, continue buspirone 5 mg 1 tab twice a day  4. Depression, recurrent ([place]) - mood this is stable, continue escitalopram 5 mg 1 tab daily   5. History of stroke - stable, continue aspirin 325 mg 1 tab daily,  metoprolol succinate ER 24 hour 1 tab daily, ramipril 10 mg 1 tab twice a day, and lasix 40 mg 1 tab daily   6. Insomnia - continue  Alprazolam 0.25 mg 1 tabdaily at bedtime when necessary      Family/ staff Communication: Discussed plan of care with patient.  Labs/tests ordered:  None  Goals of care:  [place], NP [facility] [phone] (Monday-Friday 8:00 a.m. - 5:00 p.m.) [phone] (after hours)

## 2018-07-22 ENCOUNTER — Other Ambulatory Visit: Payer: Self-pay

## 2018-07-22 MED ORDER — OXYCODONE HCL 5 MG PO TABS: 2.5000 mg | ORAL_TABLET | ORAL | 0 refills | Status: DC | PRN

## 2018-07-22 NOTE — Telephone Encounter (Signed)
Rx sent to [facility] phone : [phone] , fax : [phone]  

## 2018-07-29 ENCOUNTER — Other Ambulatory Visit: Payer: Self-pay

## 2018-07-29 ENCOUNTER — Encounter: Payer: Self-pay | Admitting: Adult Health

## 2018-07-29 ENCOUNTER — Non-Acute Institutional Stay (SKILLED_NURSING_FACILITY): Payer: Medicare Other | Admitting: Adult Health

## 2018-07-29 DIAGNOSIS — S72011A Unspecified intracapsular fracture of right femur, initial encounter for closed fracture: Secondary | ICD-10-CM | POA: Diagnosis not present

## 2018-07-29 DIAGNOSIS — I10 Essential (primary) hypertension: Secondary | ICD-10-CM | POA: Diagnosis not present

## 2018-07-29 MED ORDER — TRAMADOL HCL 50 MG PO TABS: 50.0000 mg | ORAL_TABLET | Freq: Four times a day (QID) | ORAL | 0 refills | Status: DC | PRN

## 2018-07-29 MED ORDER — ALPRAZOLAM 0.25 MG PO TABS: 0.2500 mg | ORAL_TABLET | Freq: Every evening | ORAL | 0 refills | Status: DC | PRN

## 2018-07-29 MED ORDER — OXYCODONE HCL 5 MG PO TABS: 2.5000 mg | ORAL_TABLET | ORAL | 0 refills | Status: DC | PRN

## 2018-07-29 NOTE — Progress Notes (Signed)
Location:   [facility] Room Number: [place] of Service:  SNF (31)    CODE STATUS: DNR  Allergies  Allergen Reactions  . Penicillins Rash    Has patient had a PCN reaction causing immediate rash, facial/tongue/throat swelling, SOB or lightheadedness with hypotension: Yes Has patient had a PCN reaction causing severe rash involving mucus membranes or skin necrosis: No Has patient had a PCN reaction that required hospitalization: No Has patient had a PCN reaction occurring within the last 10 years: No If all of the above answers are "NO", then may proceed with Cephalosporin use.    Chief Complaint  Patient presents with  . Discharge Note    Discharging to home on 08/01/18    HPI:  She is being discharged to home with home health for pt/ot/rn. She will not need any dme. She will need her prescriptions written and will need to follow up with her medical provider. She had been hospitalized for a right closed subcapital fracture of the right femur. She was admitted to this facility for short term rehab.    Past Medical History:  Diagnosis Date  . LBBB (left bundle branch block)   . MI (myocardial infarction) ([place])   . Stroke [facility])     Past Surgical History:  Procedure Laterality Date  . ABDOMINAL HYSTERECTOMY    . APPENDECTOMY    . HIP PINNING,CANNULATED Right 07/08/2018   Procedure: CANNULATED HIP PINNING;  Surgeon: Leim Fabry, MD;  Location: [facility] ORS;  Service: Orthopedics;  Laterality: Right;  . ROTATOR CUFF REPAIR Left     Social History   Socioeconomic History  . Marital status: Widowed    Spouse name: Not on file  . Number of children: Not on file  . Years of education: Not on file  . Highest education level: Not on file  Occupational History  . Not on file  Social Needs  . Financial resource strain: Not on file  . Food insecurity:    Worry: Not on file    Inability: Not on file  . Transportation needs:    Medical: Not on  file    Non-medical: Not on file  Tobacco Use  . Smoking status: Never Smoker  . Smokeless tobacco: Never Used  Substance and Sexual Activity  . Alcohol use: No    Frequency: Never  . Drug use: Not on file  . Sexual activity: Not on file  Lifestyle  . Physical activity:    Days per week: Not on file    Minutes per session: Not on file  . Stress: Not on file  Relationships  . Social connections:    Talks on phone: Not on file    Gets together: Not on file    Attends religious service: Not on file    Active member of club or organization: Not on file    Attends meetings of clubs or organizations: Not on file    Relationship status: Not on file  . Intimate partner violence:    Fear of current or ex partner: Not on file    Emotionally abused: Not on file    Physically abused: Not on file    Forced sexual activity: Not on file  Other Topics Concern  . Not on file  Social History Narrative  . Not on file   History reviewed. No pertinent family history.  VITAL SIGNS BP (!) 140/52   Pulse 74   Temp 97.6 F (36.4 C)  Resp 20   Ht 5\' 2"  (1.575 m)   Wt 121 lb 9.6 oz (55.2 kg)   SpO2 100%   BMI 22.24 kg/m   Patient's Medications  New Prescriptions   No medications on file  Previous Medications   ALPRAZOLAM (XANAX) 0.25 MG TABLET    Take 1 tablet (0.25 mg total) by mouth at bedtime as needed for anxiety.   ASPIRIN 325 MG TABLET    Take 325 mg by mouth daily.   BUSPIRONE (BUSPAR) 5 MG TABLET    Take 5 mg by mouth 2 (two) times daily.   CHOLECALCIFEROL 4000 UNITS CAPS    Take 4,000 Units by mouth daily.   DICLOFENAC SODIUM (VOLTAREN) 1 % GEL    Apply 4 g topically 4 (four) times daily as needed (pain).   ESCITALOPRAM (LEXAPRO) 5 MG TABLET    Take 5 mg by mouth daily.   FUROSEMIDE (LASIX) 40 MG TABLET    Take 40 mg by mouth daily.   METHOCARBAMOL (ROBAXIN) 500 MG TABLET    Take 1 tablet (500 mg total) by mouth every 6 (six) hours as needed for muscle spasms.   METOPROLOL  SUCCINATE (TOPROL-XL) 25 MG 24 HR TABLET    Take 25 mg by mouth daily.   MULTIPLE VITAMINS-MINERALS (CENTROVITE) TABS    Take 1 tablet by mouth daily.   NON FORMULARY    Diet Type: Regular   OXYCODONE (OXY IR/ROXICODONE) 5 MG IMMEDIATE RELEASE TABLET    Take 0.5-1 tablets (2.5-5 mg total) by mouth every 4 (four) hours as needed for moderate pain or severe pain (pain score 4-6).   RAMIPRIL (ALTACE) 10 MG CAPSULE    Take 10 mg by mouth 2 (two) times daily.   SENNOSIDES-DOCUSATE SODIUM (SENOKOT-S) 8.6-50 MG TABLET    Take 2 tablets by mouth at bedtime.   TRAMADOL (ULTRAM) 50 MG TABLET    Take 1 tablet (50 mg total) by mouth every 6 (six) hours as needed for moderate pain.  Modified Medications   No medications on file  Discontinued Medications   ENOXAPARIN (LOVENOX) 30 MG/0.3ML INJECTION    Inject 0.3 mLs (30 mg total) into the skin daily.   HYDROCODONE-ACETAMINOPHEN (NORCO/VICODIN) 5-325 MG TABLET    Take 0.5-1 tablets by mouth 3 (three) times daily as needed for moderate pain. Take  to 1 tablet by mouth 3 times daily as needed for severe pain     SIGNIFICANT DIAGNOSTIC EXAMS  LABS REVIEWED TODAY:   07-10-18: glucose 105; bun 20; creat 0.88; k+ 3.5; na++ 137; ca 8.2 liver normal albumin 2.8   Review of Systems  Constitutional: Negative for malaise/fatigue.  Respiratory: Negative for cough and shortness of breath.   Cardiovascular: Negative for chest pain, palpitations and leg swelling.  Gastrointestinal: Negative for abdominal pain, constipation and heartburn.  Musculoskeletal: Negative for back pain, joint pain and myalgias.  Skin: Negative.   Neurological: Negative for dizziness.  Psychiatric/Behavioral: The patient is not nervous/anxious.     Physical Exam  Constitutional: She is oriented to person, place, and time. She appears well-developed and well-nourished. No distress.  Thin   Neck: No thyromegaly present.  Cardiovascular: Normal rate, regular rhythm, normal heart sounds and  intact distal pulses.  Pulmonary/Chest: Effort normal and breath sounds normal. No respiratory distress.  Abdominal: Soft. Bowel sounds are normal. She exhibits no distension. There is no tenderness.  Musculoskeletal: She exhibits no edema.  Is able to move all extremities Is status post right hip fracture  Lymphadenopathy:    She has no cervical adenopathy.  Neurological: She is alert and oriented to person, place, and time.  Skin: Skin is warm and dry. She is not diaphoretic.  Incision line without signs of infection present.   Psychiatric: She has a normal mood and affect.     ASSESSMENT/ PLAN:   Patient is being discharged with the following home health services:  Pt/ot/rn: to evaluate and treat as indicated for gait balance strength adl training and med management.   Patient is being discharged with the following durable medical equipment:  None needed   Patient has been advised to f/u with their PCP in 1-2 weeks to bring them up to date on their rehab stay.  Social services at facility was responsible for arranging this appointment.  Pt was provided with a 30 day supply of prescriptions for medications and refills must be obtained from their PCP.  For controlled substances, a more limited supply may be provided adequate until PCP appointment only.   A 30 day supply of her prescription medications written as listed above #20 utram 50 mg tabs #10 oxycodone 5 mg tabs #10 xanax 0.5 mg tabs.   Time spent with patient: 35 minutes: discussed medications; home health needs; has all needed DME. Verbalized understanding.    Ok Edwards NP [facility]  Contact [phone] Monday through Friday 8am- 5pm  After hours call [phone]

## 2018-07-29 NOTE — Telephone Encounter (Signed)
Discharged with patient 

## 2018-08-13 DIAGNOSIS — I10 Essential (primary) hypertension: Secondary | ICD-10-CM | POA: Insufficient documentation

## 2018-08-24 ENCOUNTER — Other Ambulatory Visit: Payer: Self-pay | Admitting: Adult Health

## 2019-03-31 DIAGNOSIS — I219 Acute myocardial infarction, unspecified: Secondary | ICD-10-CM | POA: Insufficient documentation

## 2019-03-31 DIAGNOSIS — M810 Age-related osteoporosis without current pathological fracture: Secondary | ICD-10-CM | POA: Insufficient documentation

## 2019-03-31 DIAGNOSIS — I509 Heart failure, unspecified: Secondary | ICD-10-CM | POA: Insufficient documentation

## 2019-03-31 DIAGNOSIS — M170 Bilateral primary osteoarthritis of knee: Secondary | ICD-10-CM | POA: Insufficient documentation

## 2019-03-31 DIAGNOSIS — F418 Other specified anxiety disorders: Secondary | ICD-10-CM | POA: Insufficient documentation

## 2019-03-31 DIAGNOSIS — M069 Rheumatoid arthritis, unspecified: Secondary | ICD-10-CM | POA: Insufficient documentation

## 2019-04-01 ENCOUNTER — Other Ambulatory Visit: Payer: Self-pay

## 2019-04-01 ENCOUNTER — Ambulatory Visit: Payer: Medicare Other | Admitting: Podiatry

## 2019-04-01 ENCOUNTER — Encounter: Payer: Self-pay | Admitting: Podiatry

## 2019-04-01 VITALS — Temp 99.1°F

## 2019-04-01 DIAGNOSIS — B351 Tinea unguium: Secondary | ICD-10-CM | POA: Diagnosis not present

## 2019-04-01 DIAGNOSIS — M79675 Pain in left toe(s): Secondary | ICD-10-CM | POA: Diagnosis not present

## 2019-04-01 DIAGNOSIS — M79674 Pain in right toe(s): Secondary | ICD-10-CM

## 2019-04-01 NOTE — Progress Notes (Signed)
Complaint:  Visit Type: Patient presents  to my office for  preventative foot care services. Complaint: Patient states" my nails have grown long and thick and become painful to walk and wear shoes" . The patient presents for preventative foot care services. No changes to ROS  Podiatric Exam: Vascular: dorsalis pedis and posterior tibial pulses are palpable bilateral. Capillary return is immediate. Temperature gradient is WNL. Skin turgor WNL  Sensorium: Normal Semmes Weinstein monofilament test. Normal tactile sensation bilaterally. Nail Exam: Pt has thick disfigured discolored nails with subungual debris noted bilateral entire nail hallux through fifth toenails Ulcer Exam: There is no evidence of ulcer or pre-ulcerative changes or infection. Orthopedic Exam: Muscle tone and strength are WNL. No limitations in general ROM. No crepitus or effusions noted. Foot type and digits show no abnormalities. Bony prominences are unremarkable. Skin: No Porokeratosis. No infection or ulcers  Diagnosis:  Onychomycosis, , Pain in right toe, pain in left toes  Treatment & Plan Procedures and Treatment: Consent by patient was obtained for treatment procedures.   Debridement of mycotic and hypertrophic toenails, 1 through 5 bilateral and clearing of subungual debris. No ulceration, no infection noted.  Return Visit-Office Procedure: Patient instructed to return to the office for a follow up visit 3 months for continued evaluation and treatment.    Altha Sweitzer DPM 

## 2019-05-03 DIAGNOSIS — I350 Nonrheumatic aortic (valve) stenosis: Secondary | ICD-10-CM | POA: Insufficient documentation

## 2019-07-05 ENCOUNTER — Encounter: Payer: Self-pay | Admitting: Podiatry

## 2019-07-05 ENCOUNTER — Other Ambulatory Visit: Payer: Self-pay

## 2019-07-05 ENCOUNTER — Ambulatory Visit (INDEPENDENT_AMBULATORY_CARE_PROVIDER_SITE_OTHER): Payer: Medicare Other | Admitting: Podiatry

## 2019-07-05 DIAGNOSIS — M79675 Pain in left toe(s): Secondary | ICD-10-CM

## 2019-07-05 DIAGNOSIS — B351 Tinea unguium: Secondary | ICD-10-CM | POA: Diagnosis not present

## 2019-07-05 DIAGNOSIS — M79674 Pain in right toe(s): Secondary | ICD-10-CM | POA: Diagnosis not present

## 2019-07-05 NOTE — Progress Notes (Signed)
Complaint:  Visit Type: Patient presents  to my office for  preventative foot care services. Complaint: Patient states" my nails have grown long and thick and become painful to walk and wear shoes" . The patient presents for preventative foot care services. No changes to ROS  Podiatric Exam: Vascular: dorsalis pedis and posterior tibial pulses are palpable bilateral. Capillary return is immediate. Temperature gradient is WNL. Skin turgor WNL  Sensorium: Normal Semmes Weinstein monofilament test. Normal tactile sensation bilaterally. Nail Exam: Pt has thick disfigured discolored nails with subungual debris noted bilateral entire nail hallux through fifth toenails Ulcer Exam: There is no evidence of ulcer or pre-ulcerative changes or infection. Orthopedic Exam: Muscle tone and strength are WNL. No limitations in general ROM. No crepitus or effusions noted. Foot type and digits show no abnormalities. Bony prominences are unremarkable. Skin: No Porokeratosis. No infection or ulcers  Diagnosis:  Onychomycosis, , Pain in right toe, pain in left toes  Treatment & Plan Procedures and Treatment: Consent by patient was obtained for treatment procedures.   Debridement of mycotic and hypertrophic toenails, 1 through 5 bilateral and clearing of subungual debris. No ulceration, no infection noted.  Return Visit-Office Procedure: Patient instructed to return to the office for a follow up visit 3 months for continued evaluation and treatment.    Alvira Hecht DPM 

## 2019-08-30 ENCOUNTER — Ambulatory Visit: Payer: Medicare Other | Admitting: Podiatry

## 2019-08-30 ENCOUNTER — Encounter: Payer: Self-pay | Admitting: Podiatry

## 2019-08-30 DIAGNOSIS — L97521 Non-pressure chronic ulcer of other part of left foot limited to breakdown of skin: Secondary | ICD-10-CM | POA: Diagnosis not present

## 2019-08-30 DIAGNOSIS — W450XXA Nail entering through skin, initial encounter: Secondary | ICD-10-CM | POA: Insufficient documentation

## 2019-08-30 DIAGNOSIS — L97509 Non-pressure chronic ulcer of other part of unspecified foot with unspecified severity: Secondary | ICD-10-CM | POA: Insufficient documentation

## 2019-08-30 NOTE — Progress Notes (Signed)
Complaint:  Visit Type: Patient presents  to my office for evaluation of her third toe left foot.  She says the nail from her fourth toe has cut into her third toe causing pain.  She says the pain is worse upon walking.  No self treatment was performed.  She presents to the office for evaluation of her painful toe. No changes to ROS  Podiatric Exam: Vascular: dorsalis pedis and posterior tibial pulses are palpable bilateral. Capillary return is immediate. Temperature gradient is WNL. Skin turgor WNL  Sensorium: Normal Semmes Weinstein monofilament test. Normal tactile sensation bilaterally. Nail Exam: Pt has thick disfigured discolored nails with subungual debris noted bilateral entire nail hallux through fifth toenails.  Elongated nail fourth toe left foot. Ulcer Exam: There is no evidence of ulcer or pre-ulcerative changes or infection. Orthopedic Exam: Muscle tone and strength are WNL. No limitations in general ROM. No crepitus or effusions noted. Hammer toes 2,3  B/L. Bony prominences are unremarkable. Skin: No Porokeratosis. Ulcer noted lateral side of third toe left foot.    Diagnosis: Ulcer 3rd toe left foot  Nail Dystrophy.   Treatment & Plan Procedures and Treatment: Consent by patient was obtained for treatment procedures.   Debride necrotic tissue 3rd toe left foot.  Neosporin/DSD.   Return Visit-Office Procedure: Patient instructed to return to the office for a follow up visit 10 weeks. for continued evaluation and treatment.    Gardiner Barefoot DPM

## 2019-10-04 ENCOUNTER — Ambulatory Visit: Payer: Medicare Other | Admitting: Podiatry

## 2019-10-14 DIAGNOSIS — D692 Other nonthrombocytopenic purpura: Secondary | ICD-10-CM | POA: Insufficient documentation

## 2019-11-29 ENCOUNTER — Ambulatory Visit: Payer: Medicare Other | Admitting: Podiatry

## 2019-12-02 ENCOUNTER — Other Ambulatory Visit: Payer: Self-pay

## 2019-12-02 ENCOUNTER — Ambulatory Visit (INDEPENDENT_AMBULATORY_CARE_PROVIDER_SITE_OTHER): Payer: Medicare PPO | Admitting: Podiatry

## 2019-12-02 ENCOUNTER — Encounter: Payer: Self-pay | Admitting: Podiatry

## 2019-12-02 DIAGNOSIS — M79675 Pain in left toe(s): Secondary | ICD-10-CM | POA: Diagnosis not present

## 2019-12-02 DIAGNOSIS — M79674 Pain in right toe(s): Secondary | ICD-10-CM

## 2019-12-02 DIAGNOSIS — B351 Tinea unguium: Secondary | ICD-10-CM

## 2019-12-02 NOTE — Progress Notes (Signed)
Complaint:  Visit Type: Patient presents  to my office for  preventative foot care services. Complaint: Patient states" my nails have grown long and thick and become painful to walk and wear shoes" . The patient presents for preventative foot care services. No changes to ROS  Podiatric Exam: Vascular: dorsalis pedis and posterior tibial pulses are palpable bilateral. Capillary return is immediate. Temperature gradient is WNL. Skin turgor WNL  Sensorium: Normal Semmes Weinstein monofilament test. Normal tactile sensation bilaterally. Nail Exam: Pt has thick disfigured discolored nails with subungual debris noted bilateral entire nail hallux through fifth toenails Ulcer Exam: There is no evidence of ulcer or pre-ulcerative changes or infection. Orthopedic Exam: Muscle tone and strength are WNL. No limitations in general ROM. No crepitus or effusions noted. Foot type and digits show no abnormalities. Bony prominences are unremarkable. Skin: No Porokeratosis. No infection or ulcers  Diagnosis:  Onychomycosis, , Pain in right toe, pain in left toes  Treatment & Plan Procedures and Treatment: Consent by patient was obtained for treatment procedures.   Debridement of mycotic and hypertrophic toenails, 1 through 5 bilateral and clearing of subungual debris. No ulceration, no infection noted.  Return Visit-Office Procedure: Patient instructed to return to the office for a follow up visit 3 months for continued evaluation and treatment.    Amada Hallisey DPM 

## 2020-03-02 ENCOUNTER — Ambulatory Visit (INDEPENDENT_AMBULATORY_CARE_PROVIDER_SITE_OTHER): Payer: Medicare PPO | Admitting: Podiatry

## 2020-03-02 ENCOUNTER — Other Ambulatory Visit: Payer: Self-pay

## 2020-03-02 ENCOUNTER — Encounter: Payer: Self-pay | Admitting: Podiatry

## 2020-03-02 DIAGNOSIS — M79674 Pain in right toe(s): Secondary | ICD-10-CM

## 2020-03-02 DIAGNOSIS — M79675 Pain in left toe(s): Secondary | ICD-10-CM

## 2020-03-02 DIAGNOSIS — B351 Tinea unguium: Secondary | ICD-10-CM | POA: Diagnosis not present

## 2020-03-02 NOTE — Progress Notes (Signed)
This patient returns to the office for evaluation and treatment of long thick painful nails .  This patient is unable to trim her own nails since the patient cannot reach the feet.  Patient says the nails are painful walking and wearing his shoes. She returns for preventive foot care services.  General Appearance  Alert, conversant and in no acute stress.  Vascular  Dorsalis pedis and posterior tibial  pulses are weakly  palpable  bilaterally.  Capillary return is within normal limits  bilaterally. Temperature is within normal limits  bilaterally.  Neurologic  Senn-Weinstein monofilament wire test within normal limits  bilaterally. Muscle power within normal limits bilaterally.  Nails Thick disfigured discolored nails with subungual debris  from hallux to fifth toes bilaterally. No evidence of bacterial infection or drainage bilaterally.  Orthopedic  No limitations of motion  feet .  No crepitus or effusions noted.  No bony pathology or digital deformities noted.  Skin  normotropic skin with no porokeratosis noted bilaterally.  No signs of infections or ulcers noted.     Onychomycosis  Pain in toes right foot  Pain in toes left foot  Debridement  of nails  1-5  B/L with a nail nipper.  Nails were then filed using a dremel tool with no incidents.    RTC 3 months    Gardiner Barefoot DPM

## 2020-03-22 ENCOUNTER — Other Ambulatory Visit: Payer: Self-pay | Admitting: Family Medicine

## 2020-03-22 DIAGNOSIS — M545 Low back pain, unspecified: Secondary | ICD-10-CM

## 2020-03-22 DIAGNOSIS — S22080A Wedge compression fracture of T11-T12 vertebra, initial encounter for closed fracture: Secondary | ICD-10-CM

## 2020-04-06 ENCOUNTER — Ambulatory Visit: Admission: RE | Admit: 2020-04-06 | Payer: Medicare PPO | Source: Ambulatory Visit

## 2020-04-08 ENCOUNTER — Ambulatory Visit
Admission: RE | Admit: 2020-04-08 | Discharge: 2020-04-08 | Disposition: A | Discharge status: Home / Self Care | Payer: Medicare PPO | Source: Ambulatory Visit | Attending: Family Medicine | Admitting: Family Medicine

## 2020-04-08 ENCOUNTER — Other Ambulatory Visit: Payer: Self-pay

## 2020-04-08 DIAGNOSIS — M545 Low back pain, unspecified: Secondary | ICD-10-CM

## 2020-04-08 DIAGNOSIS — S22080A Wedge compression fracture of T11-T12 vertebra, initial encounter for closed fracture: Secondary | ICD-10-CM | POA: Diagnosis present

## 2020-04-08 IMAGING — MR MR THORACIC SPINE W/O CM
6 series · 36 of 48 positions shown · non-contrast
Comparison: None.

CLINICAL DATA: Right mid and low back pain

EXAM:
MRI THORACIC SPINE WITHOUT CONTRAST
TECHNIQUE: Multiplanar, multisequence MR imaging of the thoracic spine was
performed. No intravenous contrast was administered.

[Series 18: T1 · sagittal · 6.0mm · 1.56mm/px · 3 of 9 slices shown (1 of 2)]
[im 1/9]
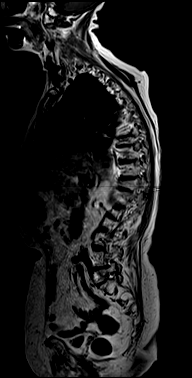
[im 5/9]
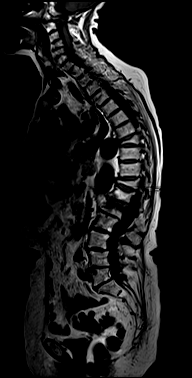
[im 9/9]
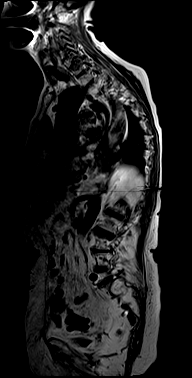

[Series 19: T2 · sagittal · 3.0mm · 1.33mm/px · 7 of 25 slices shown (1 of 2)]
[im 1/25]
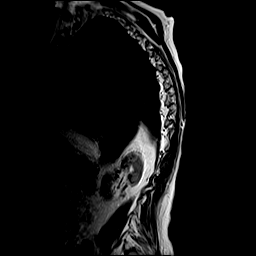
[im 5/25]
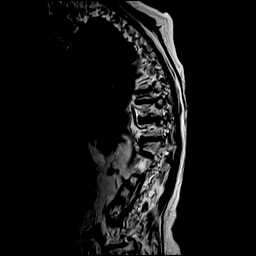
[im 9/25]
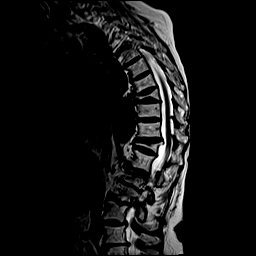
[im 13/25]
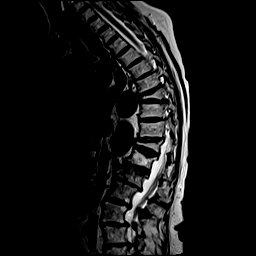
[im 17/25]
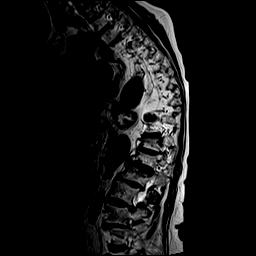
[im 21/25]
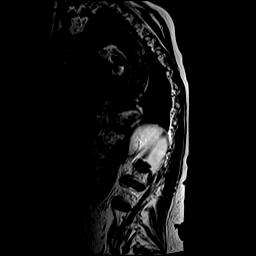
[im 25/25]
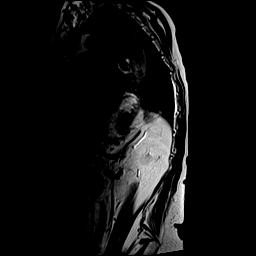

[Series 20: T1 · sagittal · 3.0mm · 1.33mm/px · 7 of 25 slices shown (2 of 2)]
[im 1/25]
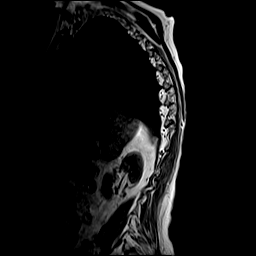
[im 5/25]
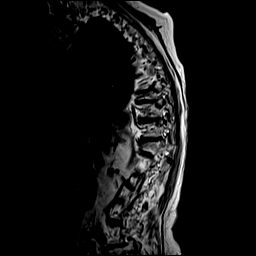
[im 9/25]
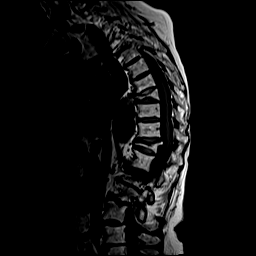
[im 13/25]
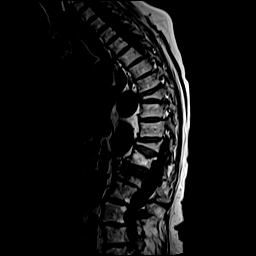
[im 17/25]
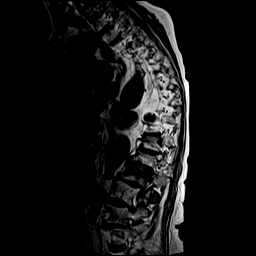
[im 21/25]
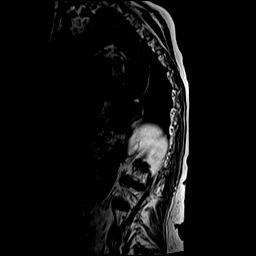
[im 25/25]
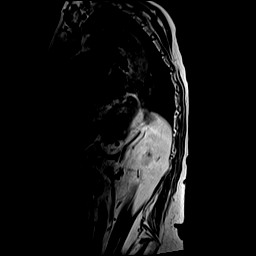

[Series 21: STIR · sagittal · 3.0mm · 0.66mm/px · 7 of 25 slices shown]
[im 1/25]
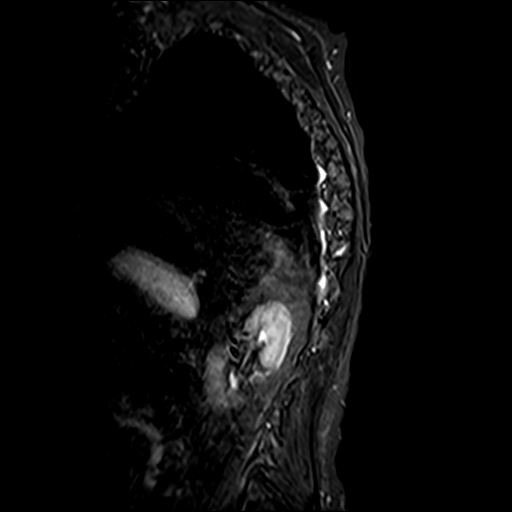
[im 5/25]
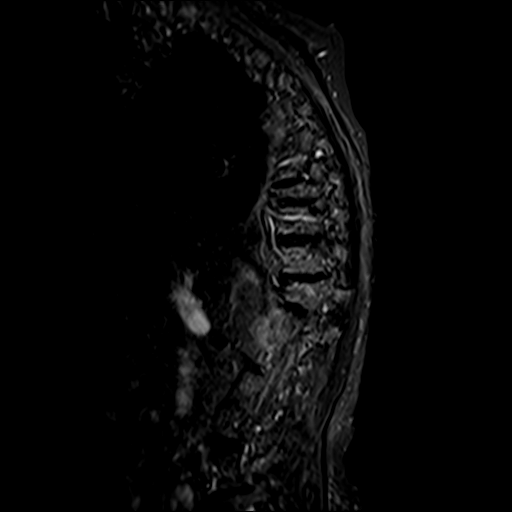
[im 9/25]
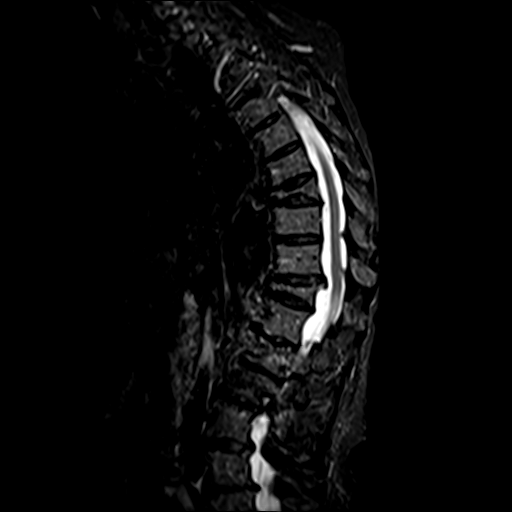
[im 13/25]
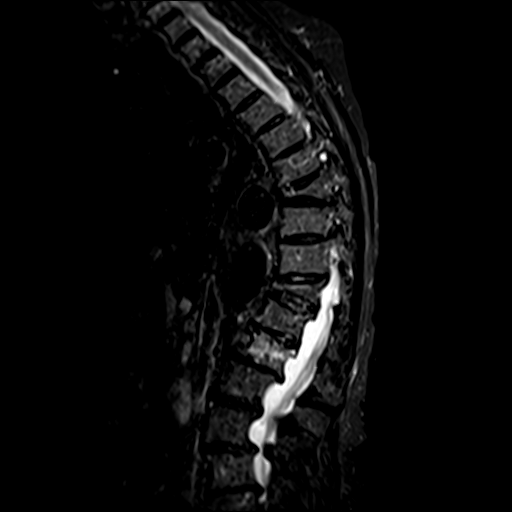
[im 17/25]
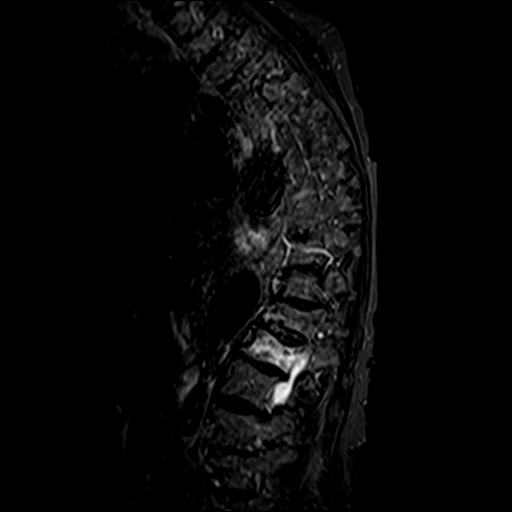
[im 21/25]
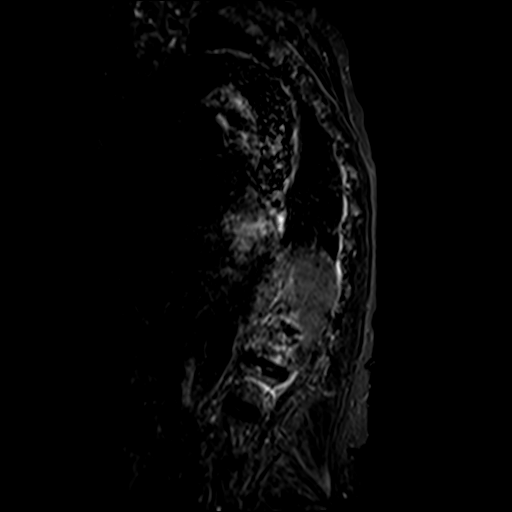
[im 25/25]
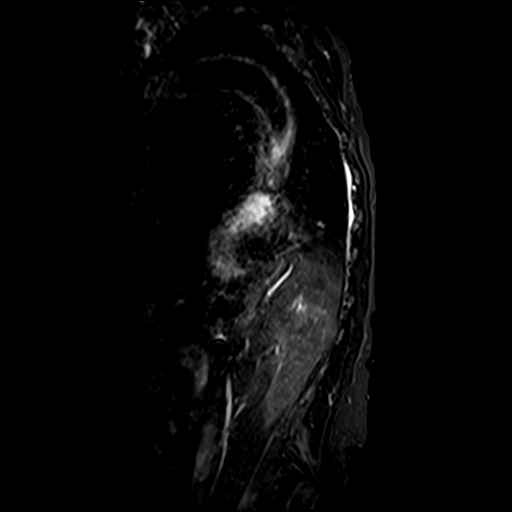

[Series 22: T2 · axial · 4.0mm · 0.59mm/px · z∈[-241,-102]mm · 9 of 39 slices shown (2 of 2)]
[im 1/39]
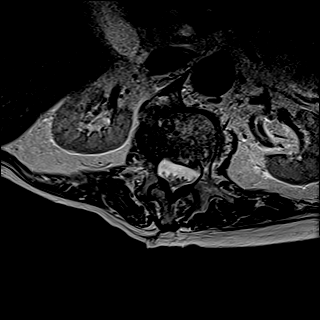
[im 7/39]
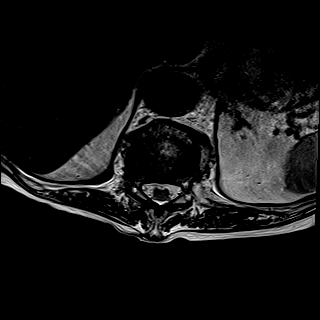
[im 11/39]
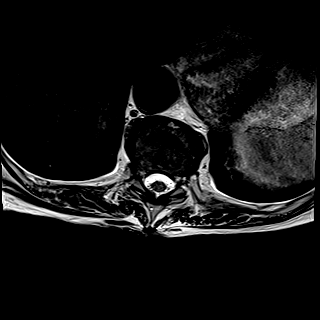
[im 18/39]
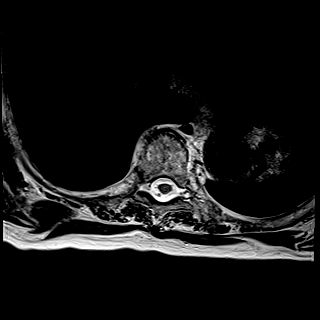
[im 21/39]
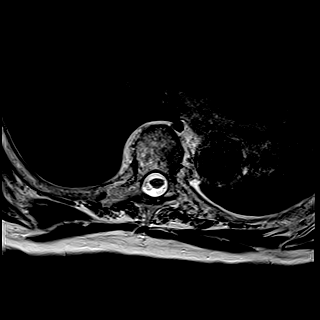
[im 28/39]
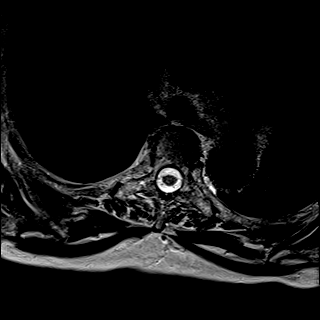
[im 32/39]
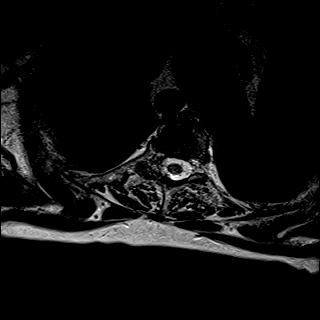
[im 35/39]
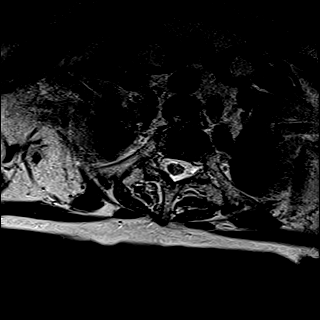
[im 39/39]
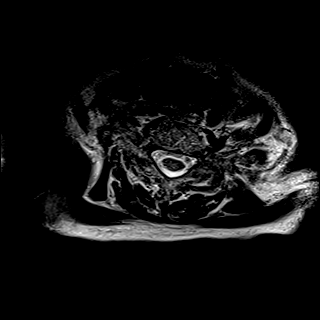

[Series 23: GRE · axial · 4.0mm · 0.37mm/px · z∈[-241,-199]mm · 3 of 39 slices shown]
[im 1/39]
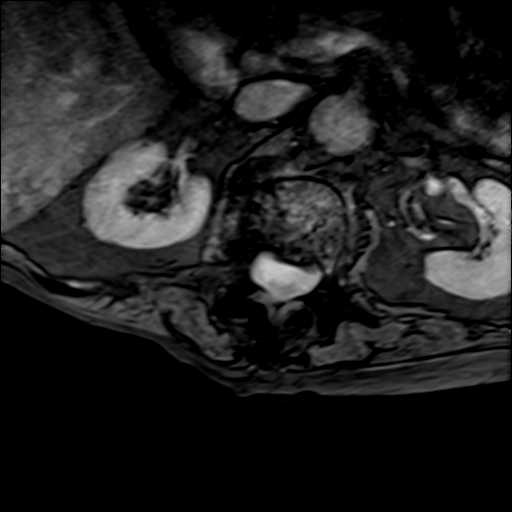
[im 7/39]
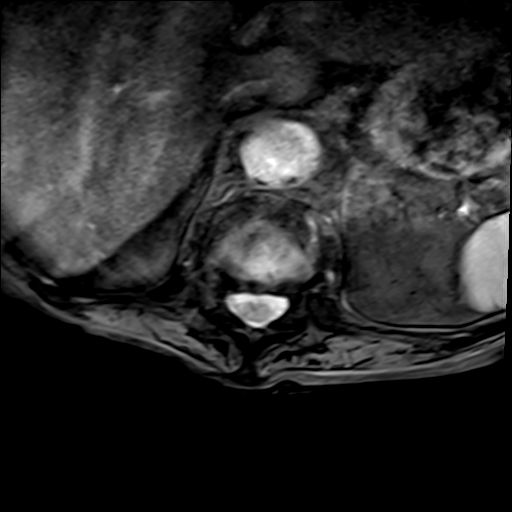
[im 11/39]
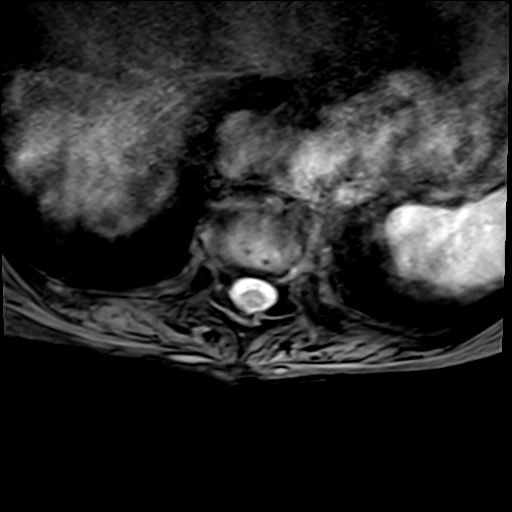

[36 of 48 positions shown; findings below may reference images not displayed]

FINDINGS: Alignment:  Accentuation of thoracic kyphosis.

Vertebrae: Severe chronic compression deformity of T8 and T11.
Moderate chronic compression deformity of T12. There is mild
compression deformity of L1 with less than 50% loss of height at the
superior endplate. Underlying fracture line and marrow edema are
present. No suspicious osseous lesion.

Cord:  No abnormal signal.

Paraspinal and other soft tissues: Unremarkable.

Disc levels:

Multilevel degenerative disc disease with disc space narrowing and
small disc bulges/protrusions. Endplate retropulsion associated with
compression deformities. Facet hypertrophy with ligamentum flavum
thickening. There is no high-grade canal or foraminal stenosis in
the thoracic spine.
IMPRESSION: Acute or subacute compression deformity of L1 with less than 50%
loss of height. Multiple chronic compression fractures.

Multilevel degenerative changes without high-grade stenosis.

## 2020-04-20 ENCOUNTER — Ambulatory Visit: Payer: Medicare PPO

## 2020-06-01 ENCOUNTER — Encounter: Payer: Self-pay | Admitting: Podiatry

## 2020-06-01 ENCOUNTER — Other Ambulatory Visit: Payer: Self-pay

## 2020-06-01 ENCOUNTER — Ambulatory Visit: Payer: Medicare PPO | Admitting: Podiatry

## 2020-06-01 DIAGNOSIS — M79675 Pain in left toe(s): Secondary | ICD-10-CM

## 2020-06-01 DIAGNOSIS — M79674 Pain in right toe(s): Secondary | ICD-10-CM | POA: Diagnosis not present

## 2020-06-01 DIAGNOSIS — B351 Tinea unguium: Secondary | ICD-10-CM

## 2020-06-01 NOTE — Progress Notes (Signed)
This patient returns to the office for evaluation and treatment of long thick painful nails .  This patient is unable to trim her own nails since the patient cannot reach the feet.  Patient says the nails are painful walking and wearing his shoes. She returns for preventive foot care services.  Patient is concerned about swelling in her feet.  General Appearance  Alert, conversant and in no acute stress.  Vascular  Dorsalis pedis and posterior tibial  pulses are weakly  palpable  bilaterally.  Capillary return is within normal limits  bilaterally. Temperature is within normal limits  bilaterally.  Neurologic  Senn-Weinstein monofilament wire test within normal limits  bilaterally. Muscle power within normal limits bilaterally.  Nails Thick disfigured discolored nails with subungual debris  from hallux to fifth toes bilaterally. No evidence of bacterial infection or drainage bilaterally.  Orthopedic  No limitations of motion  feet .  No crepitus or effusions noted.  No bony pathology or digital deformities noted.  Skin  normotropic skin with no porokeratosis noted bilaterally.  No signs of infections or ulcers noted.     Onychomycosis  Pain in toes right foot  Pain in toes left foot  Debridement  of nails  1-5  B/L with a nail nipper.  Nails were then filed using a dremel tool with no incidents.  Dispensed anklet and applied to right foot.   RTC 3 months    Gardiner Barefoot DPM

## 2020-06-02 NOTE — Addendum Note (Signed)
Addended by: Graceann Congress D on: 06/02/2020 09:00 AM   Modules accepted: Orders

## 2020-06-08 ENCOUNTER — Other Ambulatory Visit: Payer: Self-pay

## 2020-06-08 ENCOUNTER — Ambulatory Visit
Admission: RE | Admit: 2020-06-08 | Discharge: 2020-06-08 | Disposition: A | Discharge status: Home / Self Care | Payer: Medicare PPO | Source: Ambulatory Visit | Attending: Cardiology | Admitting: Cardiology

## 2020-06-08 ENCOUNTER — Other Ambulatory Visit: Payer: Self-pay | Admitting: Sports Medicine

## 2020-06-08 ENCOUNTER — Other Ambulatory Visit: Payer: Self-pay | Admitting: Cardiology

## 2020-06-08 DIAGNOSIS — R600 Localized edema: Secondary | ICD-10-CM

## 2020-06-08 DIAGNOSIS — M25561 Pain in right knee: Secondary | ICD-10-CM

## 2020-06-08 IMAGING — US US EXTREM LOW VENOUS
1 series · 13 of 24 positions shown · non-contrast
Comparison: None.

CLINICAL DATA: Bilateral lower extremity edema for the past 2
months



[Series 1: us extrem low venous · 0.07mm/px · 13 of 63 slices shown]
[im 1/63]
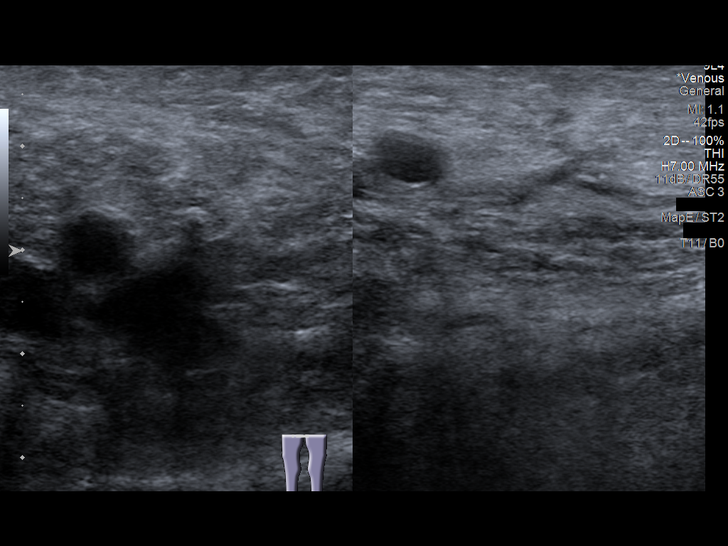
[im 6/63]
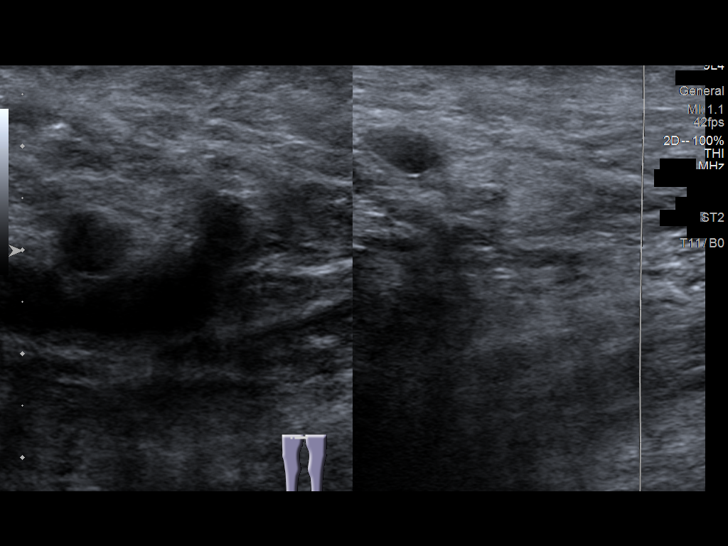
[im 11/63]
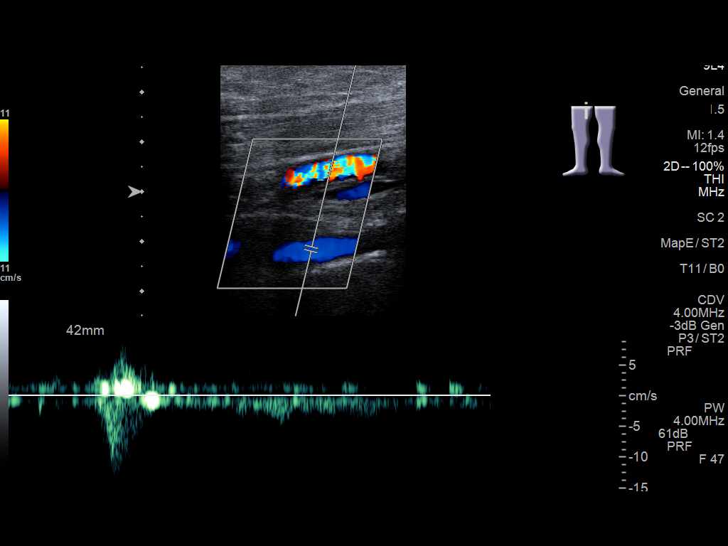
[im 17/63]
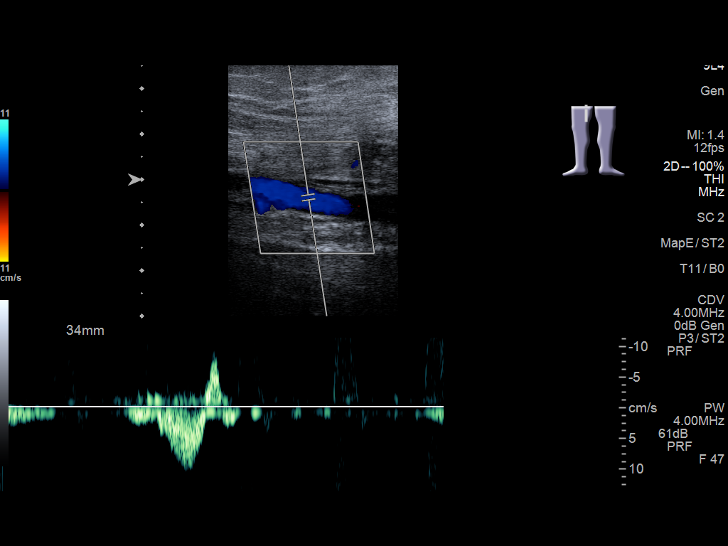
[im 22/63]
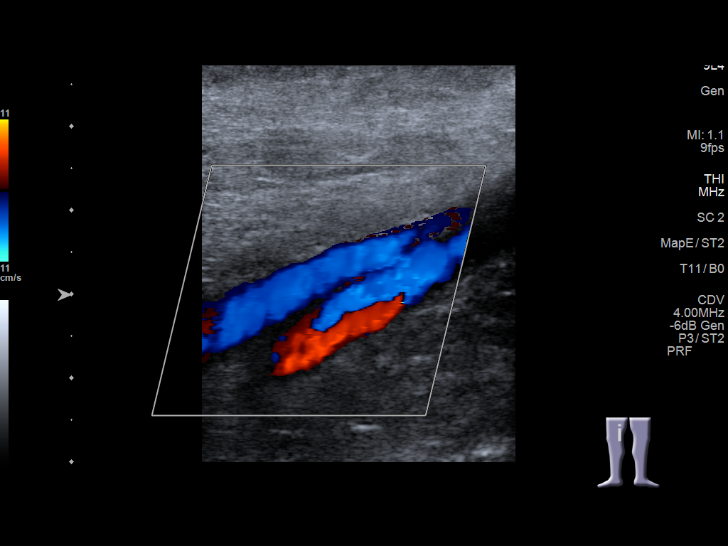
[im 27/63]
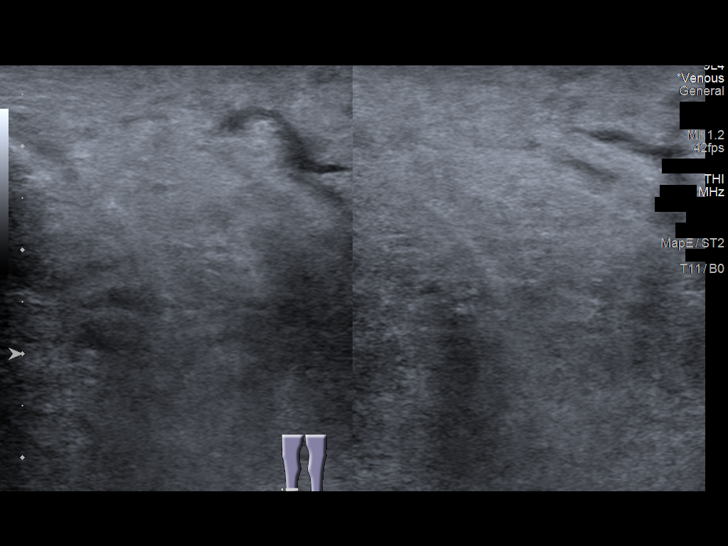
[im 33/63]
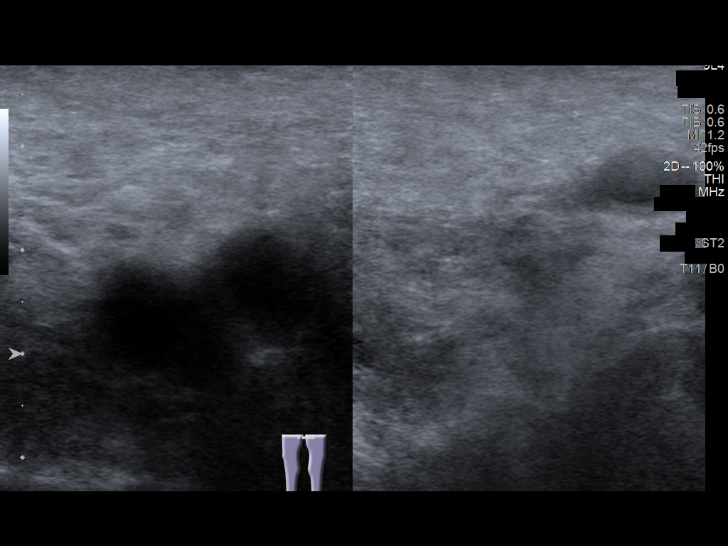
[im 36/63]
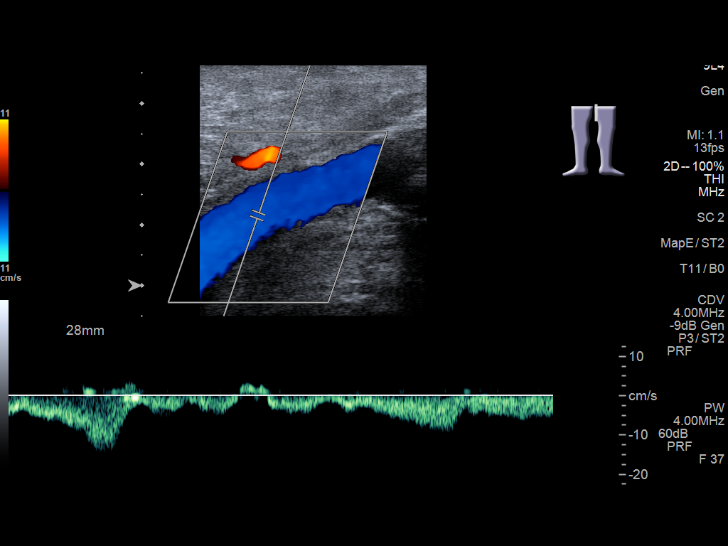
[im 41/63]
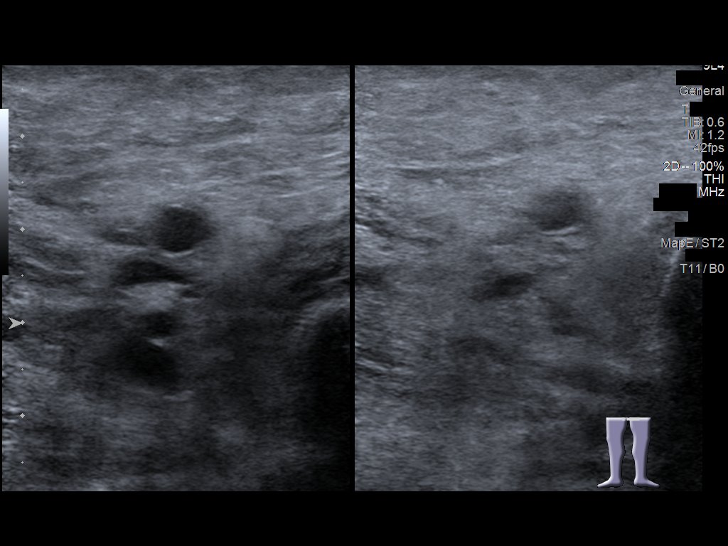
[im 46/63]
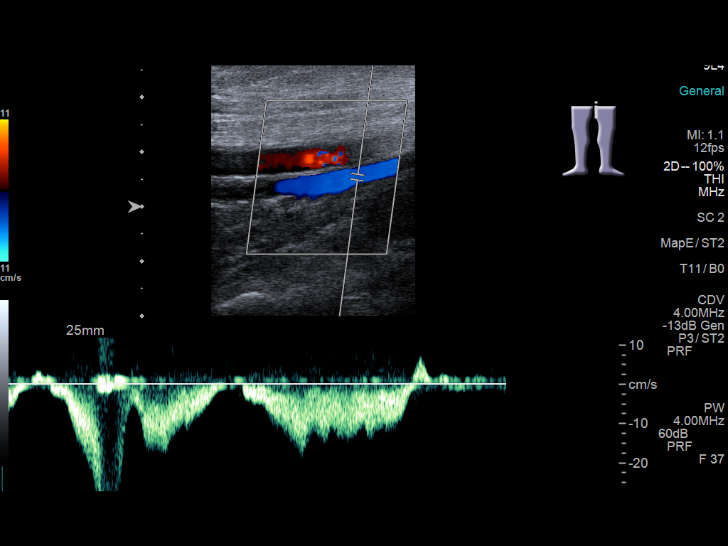
[im 52/63]
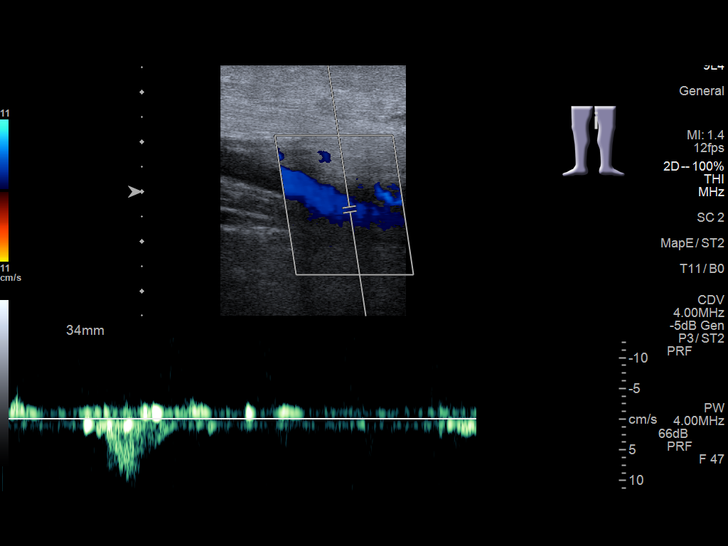
[im 57/63]
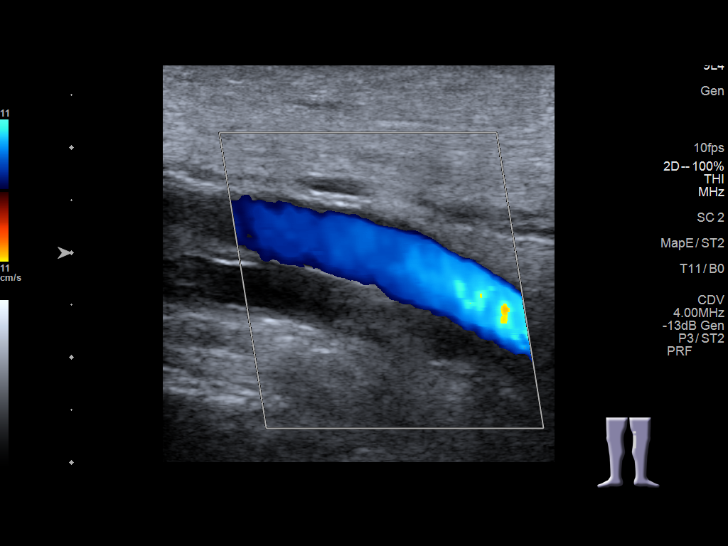
[im 63/63]
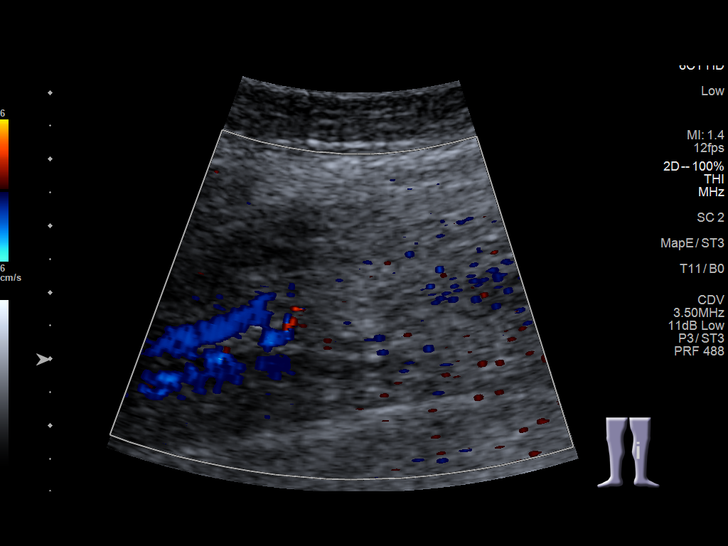

[13 of 24 positions shown; findings below may reference images not displayed]

FINDINGS: RIGHT LOWER EXTREMITY

Common Femoral Vein: No evidence of thrombus. Normal
compressibility, respiratory phasicity and response to augmentation.

Saphenofemoral Junction: No evidence of thrombus. Normal
compressibility and flow on color Doppler imaging.

Profunda Femoral Vein: No evidence of thrombus. Normal
compressibility and flow on color Doppler imaging.

Femoral Vein: No evidence of thrombus. Normal compressibility,
respiratory phasicity and response to augmentation.

Popliteal Vein: No evidence of thrombus. Normal compressibility,
respiratory phasicity and response to augmentation.

Calf Veins: No evidence of thrombus. Normal compressibility and flow
on color Doppler imaging.

Superficial Great Saphenous Vein: No evidence of thrombus. Normal
compressibility.

Venous Reflux:  None.

Other Findings:  None.

LEFT LOWER EXTREMITY

Common Femoral Vein: No evidence of thrombus. Normal
compressibility, respiratory phasicity and response to augmentation.

Saphenofemoral Junction: No evidence of thrombus. Normal
compressibility and flow on color Doppler imaging.

Profunda Femoral Vein: No evidence of thrombus. Normal
compressibility and flow on color Doppler imaging.

Femoral Vein: No evidence of thrombus. Normal compressibility,
respiratory phasicity and response to augmentation.

Popliteal Vein: No evidence of thrombus. Normal compressibility,
respiratory phasicity and response to augmentation.

Calf Veins: No evidence of thrombus. Normal compressibility and flow
on color Doppler imaging.

Superficial Great Saphenous Vein: No evidence of thrombus. Normal
compressibility.

Venous Reflux:  None.

Other Findings:  None.
IMPRESSION: No evidence of deep venous thrombosis.

## 2020-06-12 ENCOUNTER — Other Ambulatory Visit: Payer: Self-pay

## 2020-06-12 ENCOUNTER — Ambulatory Visit
Admission: RE | Admit: 2020-06-12 | Discharge: 2020-06-12 | Disposition: A | Discharge status: Home / Self Care | Payer: Medicare PPO | Source: Ambulatory Visit | Attending: Sports Medicine | Admitting: Sports Medicine

## 2020-06-12 DIAGNOSIS — M25561 Pain in right knee: Secondary | ICD-10-CM | POA: Insufficient documentation

## 2020-06-12 IMAGING — MR MR KNEE*R* W/O CM
7 series · 40 of 40 positions shown · non-contrast
Comparison: None.

CLINICAL DATA: Acute onset right knee pain 1 week ago. No known
injury.

EXAM:
MRI OF THE RIGHT KNEE WITHOUT CONTRAST
TECHNIQUE: Multiplanar, multisequence MR imaging of the knee was performed. No
intravenous contrast was administered.

[Series 8: T2 fat-sat · axial · right · 4.0mm · 0.50mm/px · z∈[-78,+47]mm · 5 of 26 slices shown (1 of 3)]
[im 1/26]
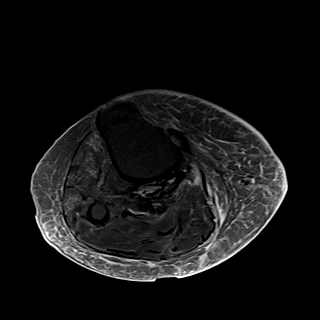
[im 7/26]
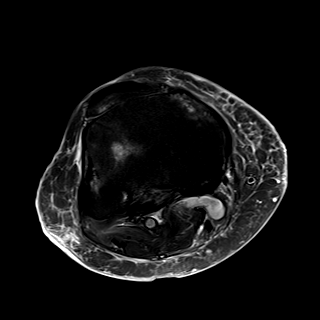
[im 13/26]
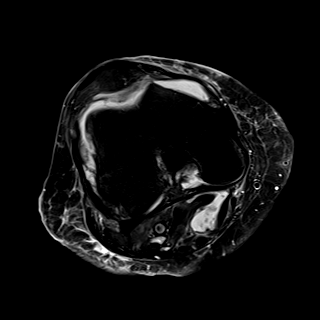
[im 19/26]
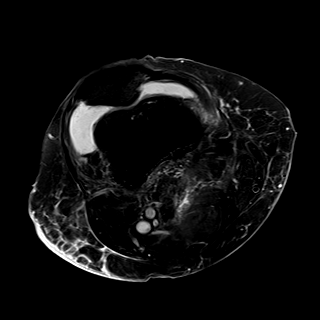
[im 26/26]
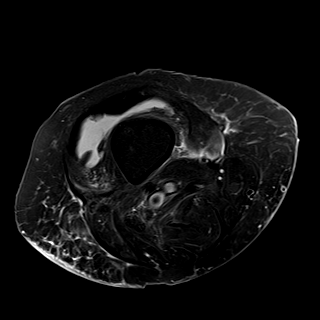

[Series 10: T1 · coronal · right · 4.0mm · 0.59mm/px · 6 of 29 slices shown]
[im 1/29]
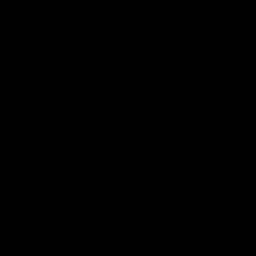
[im 6/29]
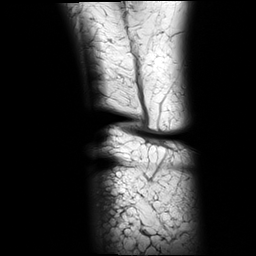
[im 12/29]
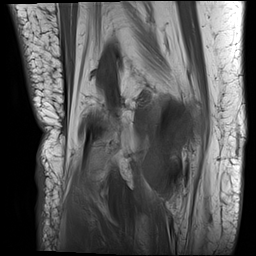
[im 17/29]
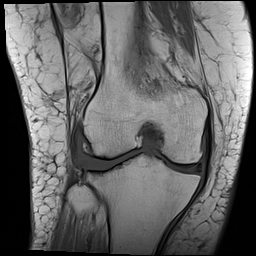
[im 23/29]
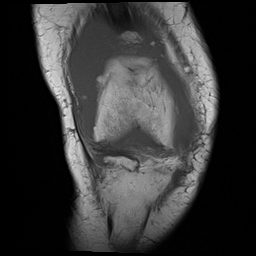
[im 29/29]
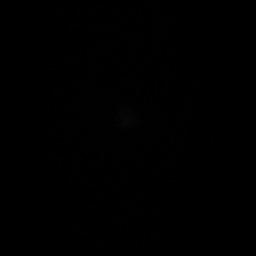

[Series 11: PD fat-sat · coronal · right · 4.0mm · 0.59mm/px · 6 of 29 slices shown (1 of 2)]
[im 1/29]
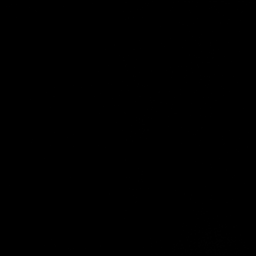
[im 6/29]
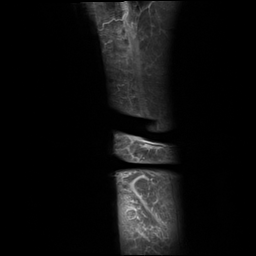
[im 12/29]
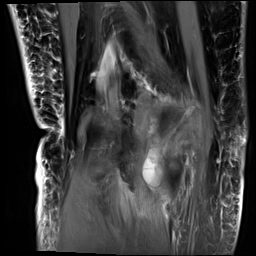
[im 17/29]
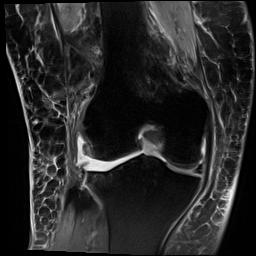
[im 23/29]
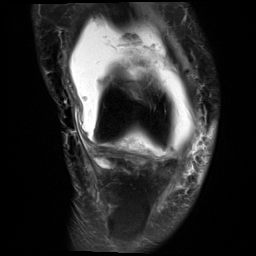
[im 29/29]
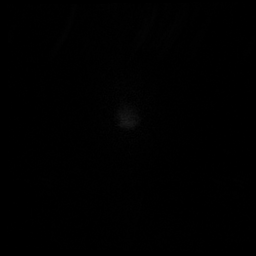

[Series 12: T2 fat-sat · coronal · right · 4.0mm · 0.59mm/px · 6 of 29 slices shown (2 of 3)]
[im 1/29]
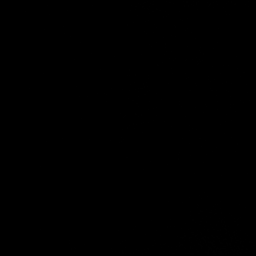
[im 6/29]
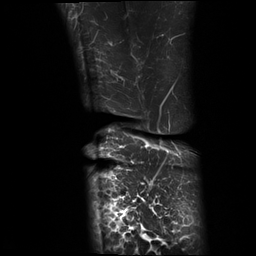
[im 12/29]
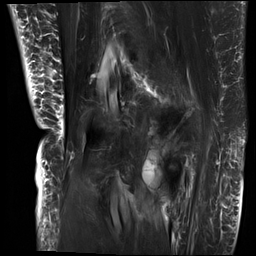
[im 17/29]
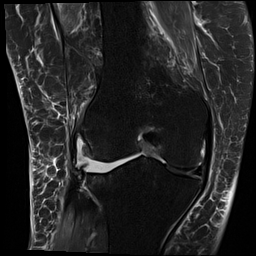
[im 23/29]
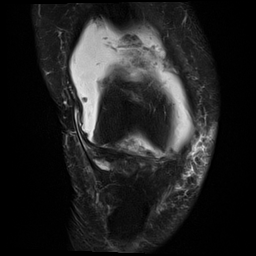
[im 29/29]
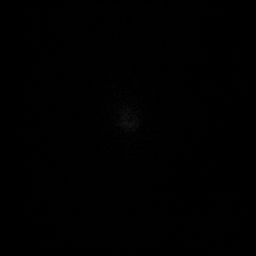

[Series 13: PD fat-sat · sagittal · right · 3.0mm · 0.59mm/px · 7 of 36 slices shown (2 of 2)]
[im 1/36]
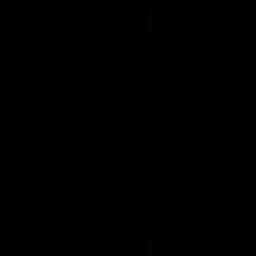
[im 6/36]
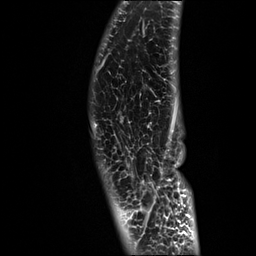
[im 12/36]
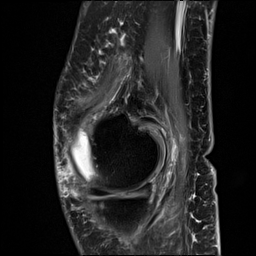
[im 18/36]
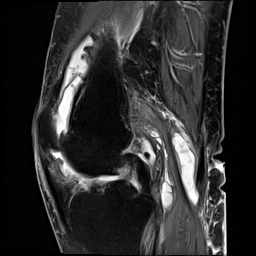
[im 24/36]
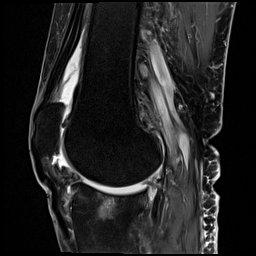
[im 30/36]
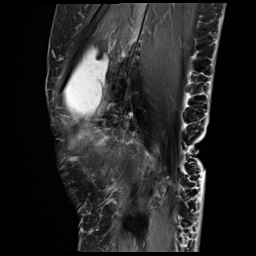
[im 36/36]
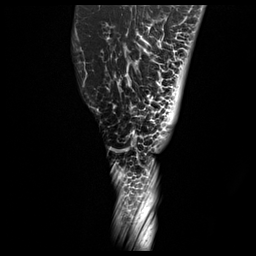

[Series 14: T2 fat-sat · sagittal · right · 3.0mm · 0.59mm/px · 7 of 36 slices shown (3 of 3)]
[im 1/36]
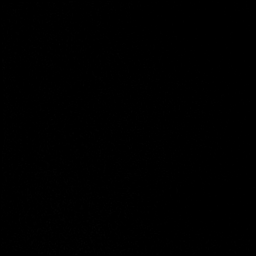
[im 6/36]
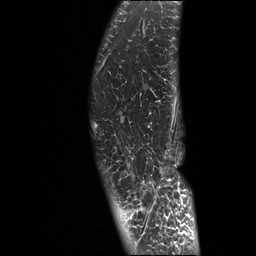
[im 12/36]
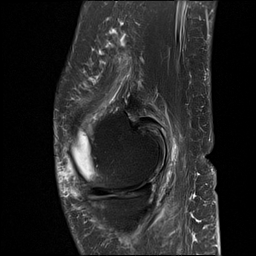
[im 18/36]
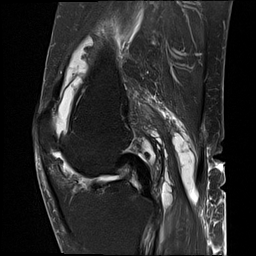
[im 24/36]
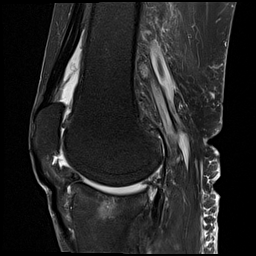
[im 30/36]
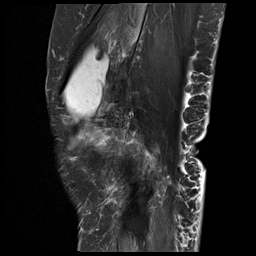
[im 36/36]
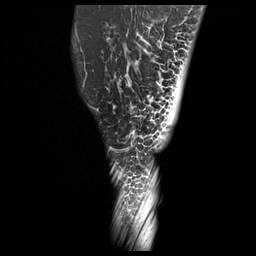

[Series 15: PD · coronal · right · 2.0mm · 0.47mm/px · 3 of 16 slices shown]
[im 1/16]
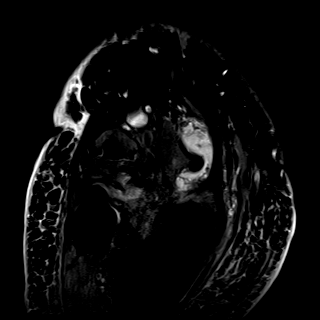
[im 8/16]
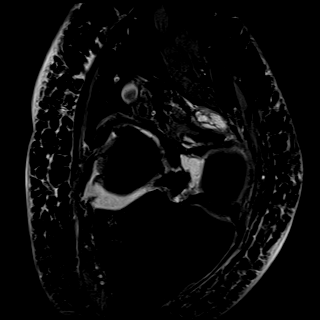
[im 16/16]
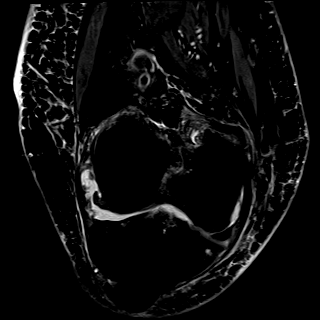

[40 of 40 positions shown; findings below may reference images not displayed]

FINDINGS: MENISCI

Medial meniscus: Horizontal tear in the posterior horn and body
reaches the meniscal undersurface. No displaced fragment.

Lateral meniscus: Virtually no normal-appearing meniscal tissue is
present consistent with diffuse degenerative maceration.

LIGAMENTS

Cruciates:  Chronic, complete ACL tear is seen.  The PCL is intact.

Collaterals:  Intact.

CARTILAGE

Patellofemoral:  Almost completely denuded.

Medial:  Moderately degenerated.

Lateral:  Completely denuded.

Joint:  Moderate joint effusion.

Popliteal Fossa: Debris containing Baker's cyst is approximately
cm craniocaudal by 2.5 cm transverse by 1 cm AP.

Extensor Mechanism:  Intact.

Bones: No fracture, contusion or worrisome lesion. There is some
subchondral edema about both the medial and lateral compartments.
Osteophytosis about the knee is worst along the lateral joint line.

Other: None.
IMPRESSION: No acute abnormality.

Severe osteoarthritis is worst laterally. Associated degenerative
maceration of the lateral meniscus is identified. There is also a
horizontal tear in the posterior horn and posterior body of the
medial meniscus.

Chronic, complete ACL tear.

Debris containing Baker's cyst.

## 2020-09-07 ENCOUNTER — Ambulatory Visit: Payer: Medicare PPO | Admitting: Podiatry

## 2020-09-07 ENCOUNTER — Other Ambulatory Visit: Payer: Self-pay

## 2020-09-07 ENCOUNTER — Encounter: Payer: Self-pay | Admitting: Podiatry

## 2020-09-07 DIAGNOSIS — M79674 Pain in right toe(s): Secondary | ICD-10-CM

## 2020-09-07 DIAGNOSIS — B351 Tinea unguium: Secondary | ICD-10-CM

## 2020-09-07 DIAGNOSIS — M79675 Pain in left toe(s): Secondary | ICD-10-CM

## 2020-09-07 NOTE — Progress Notes (Signed)
This patient returns to the office for evaluation and treatment of long thick painful nails .  This patient is unable to trim her own nails since the patient cannot reach the feet.  Patient says the nails are painful walking and wearing his shoes. She returns for preventive foot care services.  Patient is concerned about swelling in her feet.  General Appearance  Alert, conversant and in no acute stress.  Vascular  Dorsalis pedis and posterior tibial  pulses are weakly  palpable  bilaterally.  Capillary return is within normal limits  bilaterally. Temperature is within normal limits  bilaterally.  Neurologic  Senn-Weinstein monofilament wire test within normal limits  bilaterally. Muscle power within normal limits bilaterally.  Nails Thick disfigured discolored nails with subungual debris  from hallux to fifth toes bilaterally. No evidence of bacterial infection or drainage bilaterally.  Orthopedic  No limitations of motion  feet .  No crepitus or effusions noted.  No bony pathology or digital deformities noted.  Skin  normotropic skin with no porokeratosis noted bilaterally.  No signs of infections or ulcers noted.     Onychomycosis  Pain in toes right foot  Pain in toes left foot  Debridement  of nails  1-5  B/L with a nail nipper.  Nails were then filed using a dremel tool with no incidents.     RTC 3 months    Gardiner Barefoot DPM

## 2020-12-11 ENCOUNTER — Ambulatory Visit: Payer: Medicare PPO | Admitting: Podiatry

## 2020-12-11 ENCOUNTER — Encounter: Payer: Self-pay | Admitting: Podiatry

## 2020-12-11 ENCOUNTER — Other Ambulatory Visit: Payer: Self-pay

## 2020-12-11 DIAGNOSIS — B351 Tinea unguium: Secondary | ICD-10-CM

## 2020-12-11 DIAGNOSIS — M79675 Pain in left toe(s): Secondary | ICD-10-CM

## 2020-12-11 DIAGNOSIS — M79674 Pain in right toe(s): Secondary | ICD-10-CM | POA: Diagnosis not present

## 2020-12-11 NOTE — Progress Notes (Signed)
This patient returns to the office for evaluation and treatment of long thick painful nails .  This patient is unable to trim her own nails since the patient cannot reach the feet.  Patient says the nails are painful walking and wearing his shoes. She returns for preventive foot care services.  Patient is concerned about swelling in her feet.  General Appearance  Alert, conversant and in no acute stress.  Vascular  Dorsalis pedis and posterior tibial  pulses are weakly  palpable  bilaterally.  Capillary return is within normal limits  bilaterally. Cold feet  Bilaterally.  Absent digital feet  B/L.  Neurologic  Senn-Weinstein monofilament wire test within normal limits  bilaterally. Muscle power within normal limits bilaterally.  Nails Thick disfigured discolored nails with subungual debris  from hallux to fifth toes bilaterally. No evidence of bacterial infection or drainage bilaterally.  Orthopedic  No limitations of motion  feet .  No crepitus or effusions noted.  No bony pathology or digital deformities noted.  Skin  normotropic skin with no porokeratosis noted bilaterally.  No signs of infections or ulcers noted.     Onychomycosis  Pain in toes right foot  Pain in toes left foot  Debridement  of nails  1-5  B/L with a nail nipper.  Nails were then filed using a dremel tool with no incidents.     RTC 3 months    Gardiner Barefoot DPM

## 2021-03-12 ENCOUNTER — Other Ambulatory Visit: Payer: Self-pay

## 2021-03-12 ENCOUNTER — Ambulatory Visit: Payer: Medicare PPO | Admitting: Podiatry

## 2021-03-12 ENCOUNTER — Encounter: Payer: Self-pay | Admitting: Podiatry

## 2021-03-12 DIAGNOSIS — M79675 Pain in left toe(s): Secondary | ICD-10-CM | POA: Diagnosis not present

## 2021-03-12 DIAGNOSIS — M79674 Pain in right toe(s): Secondary | ICD-10-CM | POA: Diagnosis not present

## 2021-03-12 DIAGNOSIS — B351 Tinea unguium: Secondary | ICD-10-CM

## 2021-03-12 NOTE — Progress Notes (Signed)
This patient returns to the office for evaluation and treatment of long thick painful nails .  This patient is unable to trim her own nails since the patient cannot reach the feet.  Patient says the nails are painful walking and wearing his shoes. She returns for preventive foot care services.   General Appearance  Alert, conversant and in no acute stress.  Vascular  Dorsalis pedis and posterior tibial  pulses are weakly  palpable  bilaterally.  Capillary return is within normal limits  bilaterally. Cold feet  Bilaterally.  Absent digital feet  B/L.  Neurologic  Senn-Weinstein monofilament wire test within normal limits  bilaterally. Muscle power within normal limits bilaterally.  Nails Thick disfigured discolored nails with subungual debris  from hallux to fifth toes bilaterally. No evidence of bacterial infection or drainage bilaterally.  Orthopedic  No limitations of motion  feet .  No crepitus or effusions noted.  No bony pathology or digital deformities noted.  Skin  normotropic skin with no porokeratosis noted bilaterally.  No signs of infections or ulcers noted.     Onychomycosis  Pain in toes right foot  Pain in toes left foot  Debridement  of nails  1-5  B/L with a nail nipper.  Nails were then filed using a dremel tool with no incidents.     RTC 3 months    Gardiner Barefoot DPM

## 2021-06-12 DIAGNOSIS — N1831 Chronic kidney disease, stage 3a: Secondary | ICD-10-CM | POA: Diagnosis present

## 2021-07-11 ENCOUNTER — Other Ambulatory Visit (HOSPITAL_COMMUNITY): Payer: Self-pay | Admitting: Physician Assistant

## 2021-07-11 ENCOUNTER — Other Ambulatory Visit: Payer: Self-pay | Admitting: Physician Assistant

## 2021-07-11 DIAGNOSIS — S22080A Wedge compression fracture of T11-T12 vertebra, initial encounter for closed fracture: Secondary | ICD-10-CM

## 2021-07-24 ENCOUNTER — Ambulatory Visit
Admission: RE | Admit: 2021-07-24 | Discharge: 2021-07-24 | Disposition: A | Discharge status: Home / Self Care | Payer: Medicare PPO | Source: Ambulatory Visit | Attending: Physician Assistant | Admitting: Physician Assistant

## 2021-07-24 DIAGNOSIS — S22080A Wedge compression fracture of T11-T12 vertebra, initial encounter for closed fracture: Secondary | ICD-10-CM | POA: Diagnosis not present

## 2021-07-24 IMAGING — MR MR THORACIC SPINE W/O CM
6 series · 36 of 48 positions shown · non-contrast
Comparison: [DATE]

CLINICAL DATA: Right lower back pain and urinary frequency

EXAM:
MRI THORACIC SPINE WITHOUT CONTRAST
TECHNIQUE: Multiplanar, multisequence MR imaging of the thoracic spine was
performed. No intravenous contrast was administered.

[Series 18: T1 · sagittal · 6.0mm · 1.88mm/px · 3 of 9 slices shown (1 of 2)]
[im 1/9]
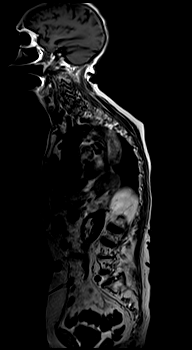
[im 5/9]
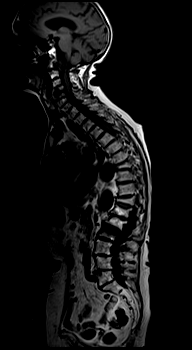
[im 9/9]
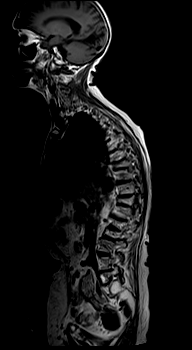

[Series 19: T2 · sagittal · 3.0mm · 1.33mm/px · 7 of 21 slices shown (1 of 2)]
[im 1/21]
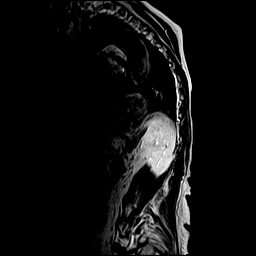
[im 4/21]
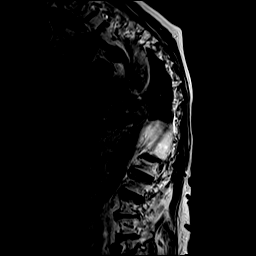
[im 7/21]
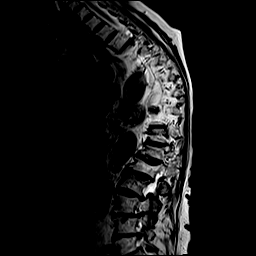
[im 11/21]
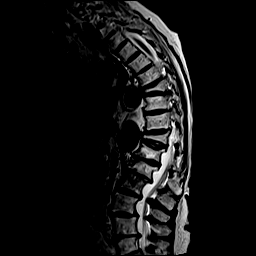
[im 14/21]
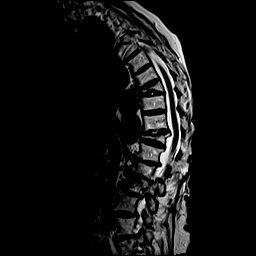
[im 17/21]
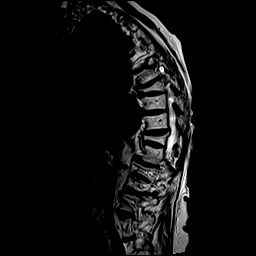
[im 21/21]
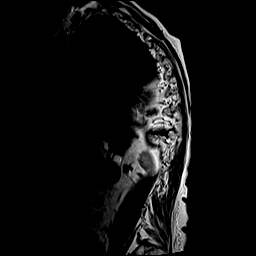

[Series 20: T1 · sagittal · 3.0mm · 1.33mm/px · 7 of 21 slices shown (2 of 2)]
[im 1/21]
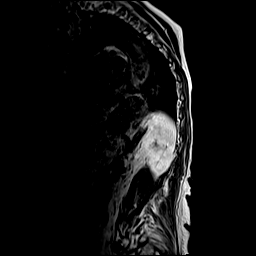
[im 4/21]
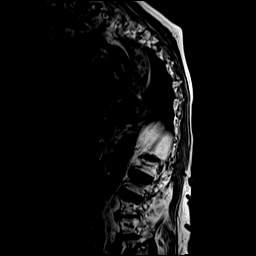
[im 7/21]
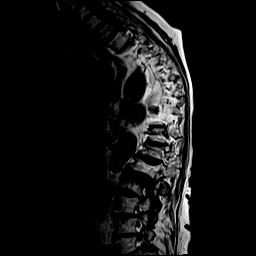
[im 11/21]
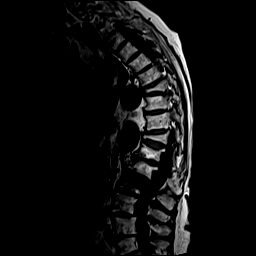
[im 14/21]
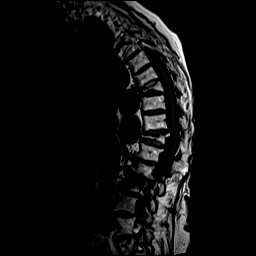
[im 17/21]
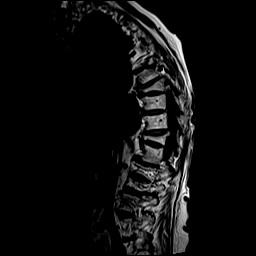
[im 21/21]
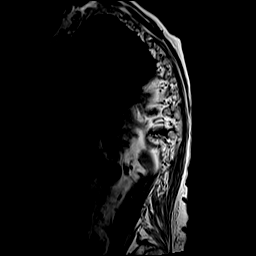

[Series 22: STIR · sagittal · 3.0mm · 0.66mm/px · 7 of 21 slices shown]
[im 1/21]
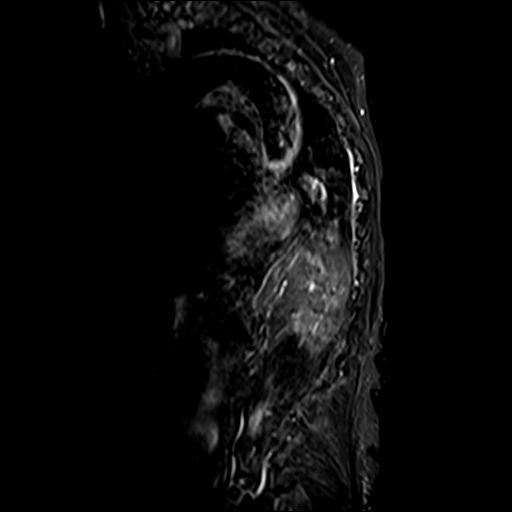
[im 4/21]
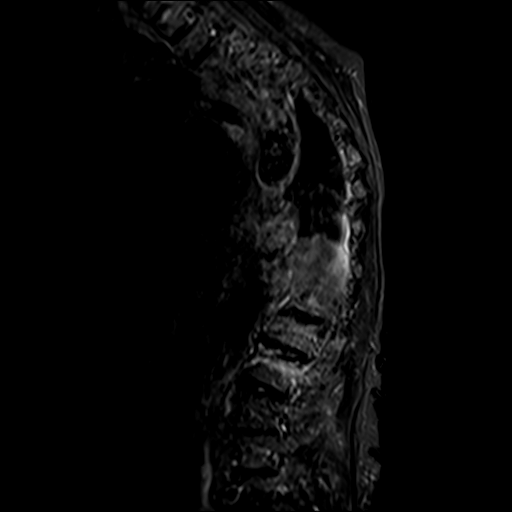
[im 7/21]
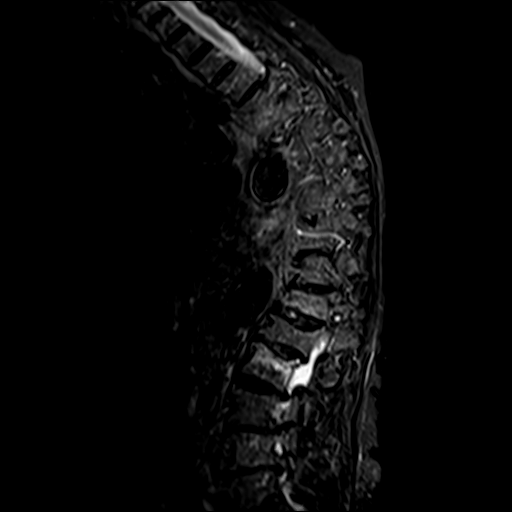
[im 11/21]
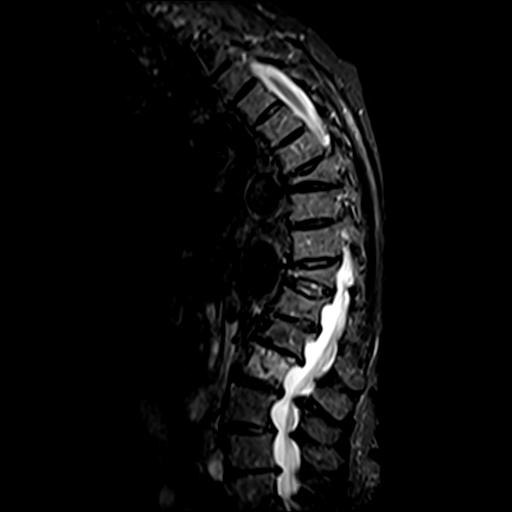
[im 14/21]
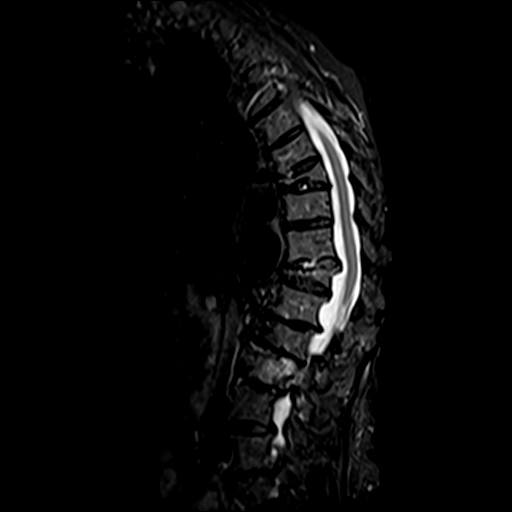
[im 17/21]
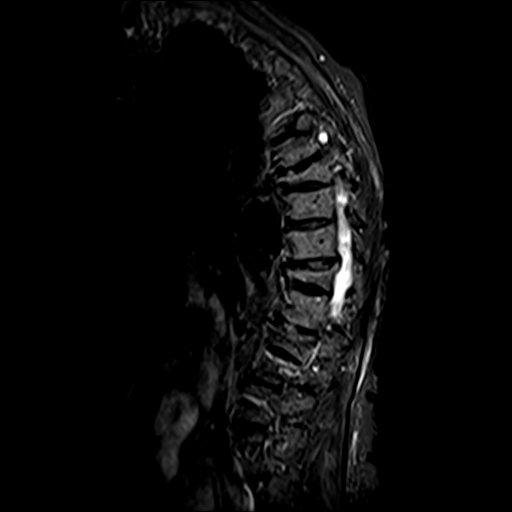
[im 21/21]
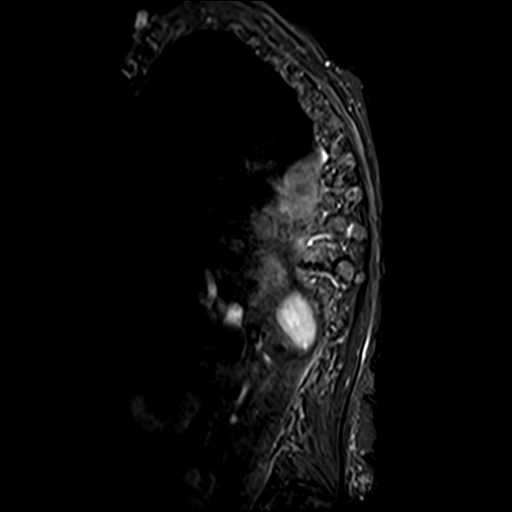

[Series 23: T2 · axial · 4.0mm · 0.59mm/px · z∈[-203,-62]mm · 9 of 39 slices shown (2 of 2)]
[im 1/39]
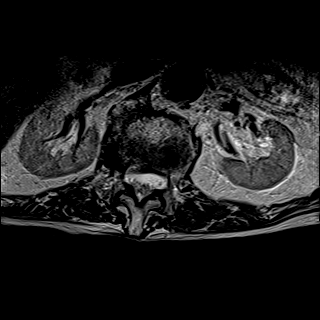
[im 7/39]
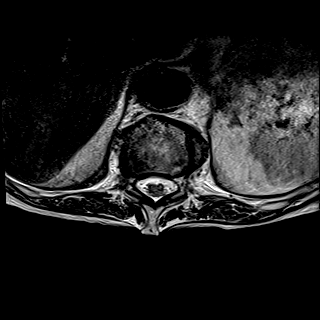
[im 11/39]
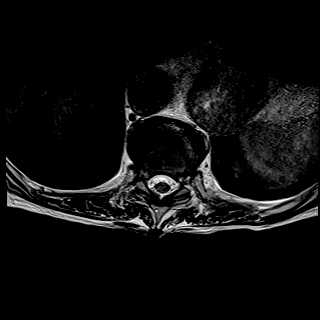
[im 18/39]
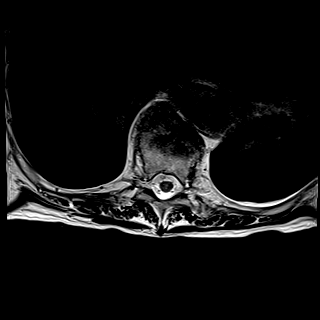
[im 21/39]
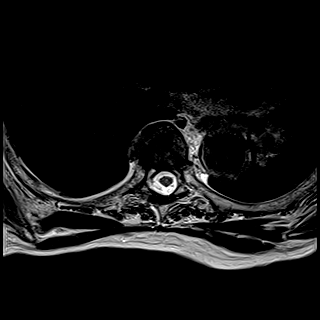
[im 28/39]
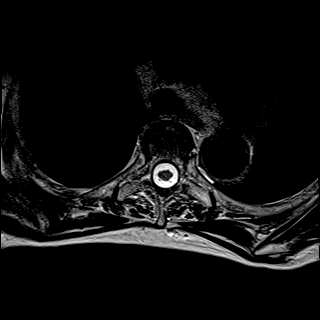
[im 32/39]
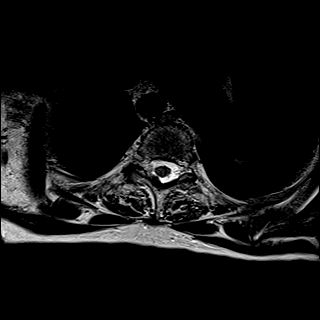
[im 35/39]
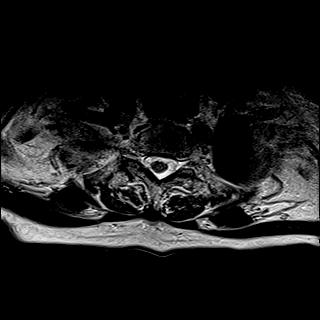
[im 39/39]
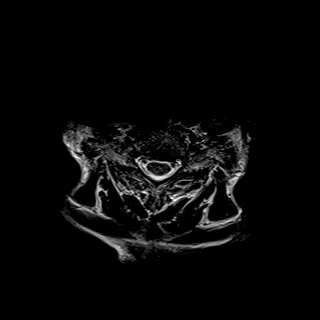

[Series 24: GRE · axial · 4.0mm · 0.37mm/px · z∈[-203,-151]mm · 3 of 39 slices shown]
[im 1/39]
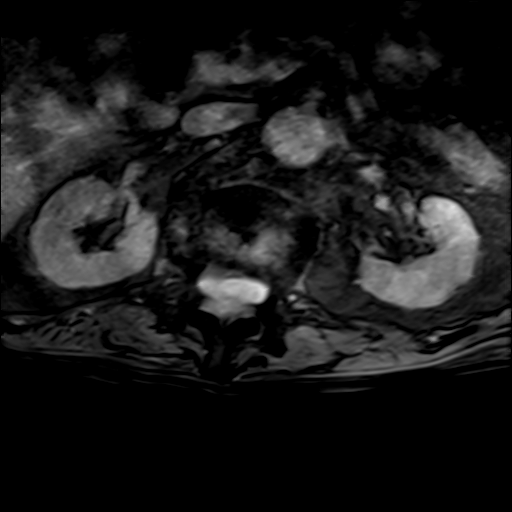
[im 7/39]
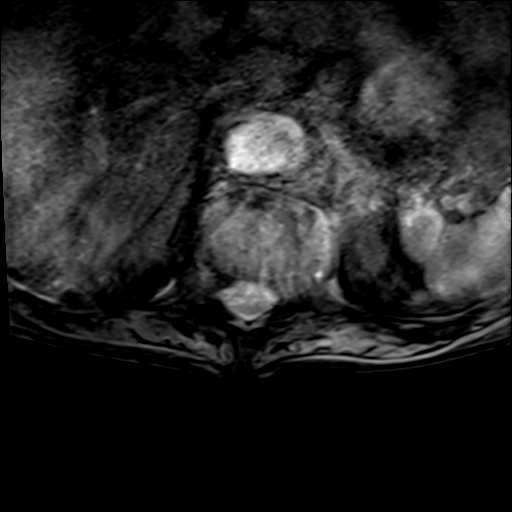
[im 11/39]
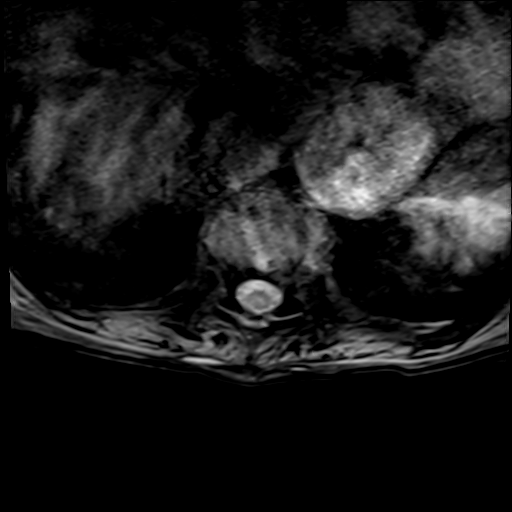

[36 of 48 positions shown; findings below may reference images not displayed]

FINDINGS: Alignment: S shaped curvature of the thoracolumbar spine.
Accentuation of thoracic kyphosis secondary to multiple compression
deformities.

Vertebrae: Redemonstrated severe chronic compression deformities of
T8 and T11, unchanged. Moderate chronic compression deformity of T12
and L1, with progression of the previously noted L1 compression
deformity, now approximately 30% height loss anteriorly, without
increased T2 signal within this vertebral body, likely chronic.
Increased T2 signal with in the L2 vertebral body, with mild
(approximately 10%) height loss. Coronal no suspicious osseous
lesion. No acute fracture.

Cord:  Normal signal and morphology.

Paraspinal and other soft tissues: Negative.

Disc levels:

Multilevel degenerative disease, with disc space narrowing and small
disc bulges. Unchanged retropulsion of the endplate associated with
the T11 compression fracture. No significant spinal canal stenosis.
Moderate left neural foraminal narrowing at T10-T11 and T11-T12.
IMPRESSION: 1. Mild height loss at the superior aspect of L2, with increased T2
signal, likely acute to subacute compression fracture.
2. Progression of previously noted L1 compression deformity, now
with approximately 30% height loss anteriorly. Absence of increased
T2 signal within this vertebral body suggests that this change is
remote.
3. Unchanged compression fractures of T8, T11, and T12.
4. No spinal canal stenosis. Moderate left neural foraminal
narrowing at T10-T11 and T11-T12.

## 2021-10-03 DIAGNOSIS — F02B0 Dementia in other diseases classified elsewhere, moderate, without behavioral disturbance, psychotic disturbance, mood disturbance, and anxiety: Secondary | ICD-10-CM | POA: Diagnosis present

## 2021-10-03 DIAGNOSIS — G301 Alzheimer's disease with late onset: Secondary | ICD-10-CM | POA: Diagnosis present

## 2022-02-14 ENCOUNTER — Emergency Department: Payer: Medicare PPO

## 2022-02-14 ENCOUNTER — Observation Stay
Admit: 2022-02-14 | Discharge: 2022-02-14 | Disposition: A | Discharge status: Home / Self Care | Payer: Medicare PPO | Attending: Neurology | Admitting: Neurology

## 2022-02-14 ENCOUNTER — Encounter: Payer: Self-pay | Admitting: Emergency Medicine

## 2022-02-14 ENCOUNTER — Observation Stay
Admission: EM | Admit: 2022-02-14 | Discharge: 2022-02-15 | Disposition: A | Discharge status: Skilled Nursing Facility | Payer: Medicare PPO | Attending: Internal Medicine | Admitting: Internal Medicine

## 2022-02-14 ENCOUNTER — Other Ambulatory Visit: Payer: Self-pay

## 2022-02-14 ENCOUNTER — Observation Stay: Payer: Medicare PPO

## 2022-02-14 DIAGNOSIS — F32A Depression, unspecified: Secondary | ICD-10-CM | POA: Diagnosis not present

## 2022-02-14 DIAGNOSIS — R42 Dizziness and giddiness: Secondary | ICD-10-CM | POA: Diagnosis present

## 2022-02-14 DIAGNOSIS — Z79899 Other long term (current) drug therapy: Secondary | ICD-10-CM | POA: Diagnosis not present

## 2022-02-14 DIAGNOSIS — M069 Rheumatoid arthritis, unspecified: Secondary | ICD-10-CM | POA: Insufficient documentation

## 2022-02-14 DIAGNOSIS — Z8673 Personal history of transient ischemic attack (TIA), and cerebral infarction without residual deficits: Secondary | ICD-10-CM | POA: Diagnosis not present

## 2022-02-14 DIAGNOSIS — I2510 Atherosclerotic heart disease of native coronary artery without angina pectoris: Secondary | ICD-10-CM | POA: Insufficient documentation

## 2022-02-14 DIAGNOSIS — I10 Essential (primary) hypertension: Secondary | ICD-10-CM | POA: Insufficient documentation

## 2022-02-14 DIAGNOSIS — F329 Major depressive disorder, single episode, unspecified: Secondary | ICD-10-CM | POA: Insufficient documentation

## 2022-02-14 DIAGNOSIS — F0280 Dementia in other diseases classified elsewhere without behavioral disturbance: Secondary | ICD-10-CM | POA: Insufficient documentation

## 2022-02-14 DIAGNOSIS — G309 Alzheimer's disease, unspecified: Secondary | ICD-10-CM | POA: Insufficient documentation

## 2022-02-14 DIAGNOSIS — I639 Cerebral infarction, unspecified: Secondary | ICD-10-CM | POA: Diagnosis not present

## 2022-02-14 DIAGNOSIS — K5790 Diverticulosis of intestine, part unspecified, without perforation or abscess without bleeding: Secondary | ICD-10-CM | POA: Insufficient documentation

## 2022-02-14 DIAGNOSIS — Z7982 Long term (current) use of aspirin: Secondary | ICD-10-CM | POA: Diagnosis not present

## 2022-02-14 LAB — COMPREHENSIVE METABOLIC PANEL
ALT: 7 U/L (ref 0–44)
AST: 15 U/L (ref 15–41)
Albumin: 3.5 g/dL (ref 3.5–5.0)
Alkaline Phosphatase: 92 U/L (ref 38–126)
Anion gap: 5 (ref 5–15)
BUN: 16 mg/dL (ref 8–23)
CO2: 23 mmol/L (ref 22–32)
Calcium: 8.7 mg/dL — ABNORMAL LOW (ref 8.9–10.3)
Chloride: 105 mmol/L (ref 98–111)
Creatinine, Ser: 1.03 mg/dL — ABNORMAL HIGH (ref 0.44–1.00)
GFR, Estimated: 50 mL/min — ABNORMAL LOW (ref >60)
Glucose, Bld: 140 mg/dL — ABNORMAL HIGH (ref 70–99)
Potassium: 4.2 mmol/L (ref 3.5–5.1)
Sodium: 133 mmol/L — ABNORMAL LOW (ref 135–145)
Total Bilirubin: 0.5 mg/dL (ref 0.3–1.2)
Total Protein: 6.2 g/dL — ABNORMAL LOW (ref 6.5–8.1)

## 2022-02-14 LAB — ECHOCARDIOGRAM COMPLETE BUBBLE STUDY
AR max vel: 1.24 cm2
AV Area VTI: 1.41 cm2
AV Area mean vel: 1.25 cm2
AV Mean grad: 13.7 mmHg
AV Peak grad: 24.5 mmHg
Ao pk vel: 2.47 m/s
Area-P 1/2: 11.85 cm2
Calc EF: 58.4 %
MV VTI: 3.13 cm2
P 1/2 time: 512 msec
S' Lateral: 2.8 cm
Single Plane A2C EF: 56.2 %
Single Plane A4C EF: 51.9 %

## 2022-02-14 LAB — HEMOGLOBIN A1C
Hgb A1c MFr Bld: 5.4 % (ref 4.8–5.6)
Mean Plasma Glucose: 108.28 mg/dL

## 2022-02-14 LAB — CBC
HCT: 33.7 % — ABNORMAL LOW (ref 36.0–46.0)
Hemoglobin: 10.4 g/dL — ABNORMAL LOW (ref 12.0–15.0)
MCH: 27.7 pg (ref 26.0–34.0)
MCHC: 30.9 g/dL (ref 30.0–36.0)
MCV: 89.9 fL (ref 80.0–100.0)
Platelets: 299 10*3/uL (ref 150–400)
RBC: 3.75 MIL/uL — ABNORMAL LOW (ref 3.87–5.11)
RDW: 13.1 % (ref 11.5–15.5)
WBC: 7.3 10*3/uL (ref 4.0–10.5)
nRBC: 0 % (ref 0.0–0.2)

## 2022-02-14 LAB — LIPASE, BLOOD: Lipase: 33 U/L (ref 11–51)

## 2022-02-14 LAB — TROPONIN I (HIGH SENSITIVITY): Troponin I (High Sensitivity): 7 ng/L (ref <18)

## 2022-02-14 IMAGING — MR MR MRA HEAD W/O CM
1 series · 17 of 48 positions shown · IV contrast (gadavist)
Comparison: Head CT [DATE]. Carotid Doppler ultrasound
[DATE].

CLINICAL DATA: Neuro deficit, acute, stroke suspected; History of
multiple aneurysms; Carotid artery stenosis screening, risk factors.
Altered mental status. Weakness and nausea.

EXAM:
MRI HEAD WITHOUT CONTRAST
MRA HEAD WITHOUT CONTRAST
MRA OF THE NECK WITHOUT AND WITH CONTRAST
TECHNIQUE: Multiplanar, multi-echo pulse sequences of the brain and surrounding
structures were acquired without intravenous contrast. Angiographic
images of the Circle of Willis were acquired using MRA technique
without intravenous contrast. Angiographic images of the neck were
acquired using MRA technique without and with intravenous contrast.
Carotid stenosis measurements (when applicable) are obtained
utilizing NASCET criteria, using the distal internal carotid
diameter as the denominator.
CONTRAST:  5mL GADAVIST GADOBUTROL 1 MMOL/ML IV SOLN

[Series 1: TOF · axial · 0.5mm · 0.41mm/px · z∈[-7,+91]mm · 17 of 205 slices shown]
[im 1/205]
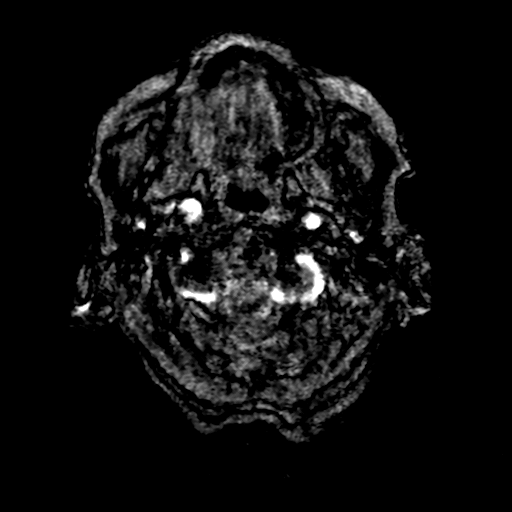
[im 5/205]
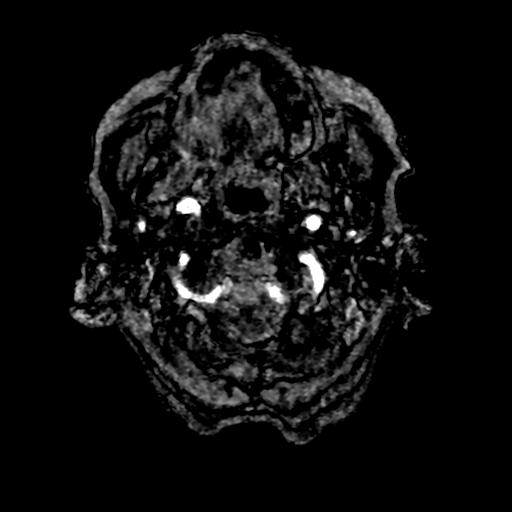
[im 9/205]
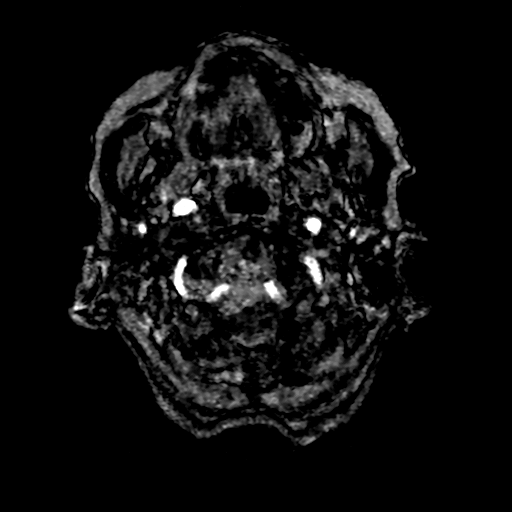
[im 14/205]
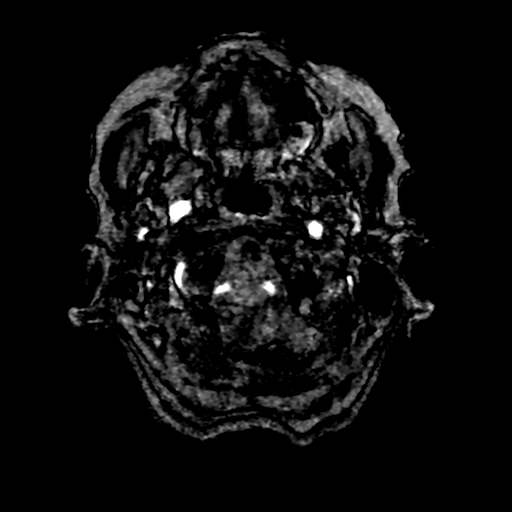
[im 18/205]
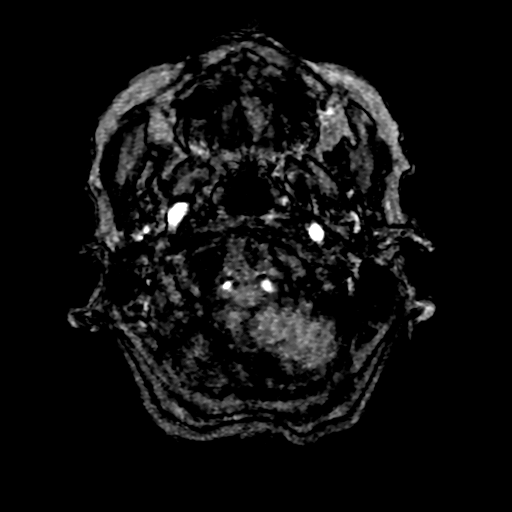
[im 22/205]
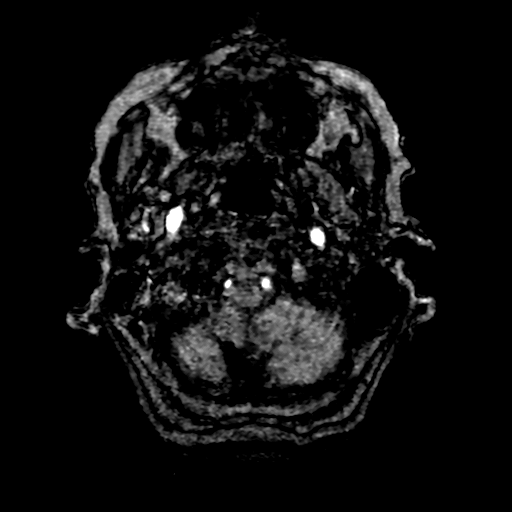
[im 27/205]
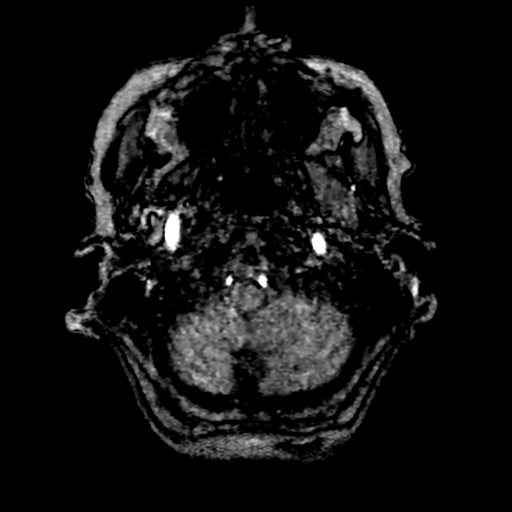
[im 35/205]
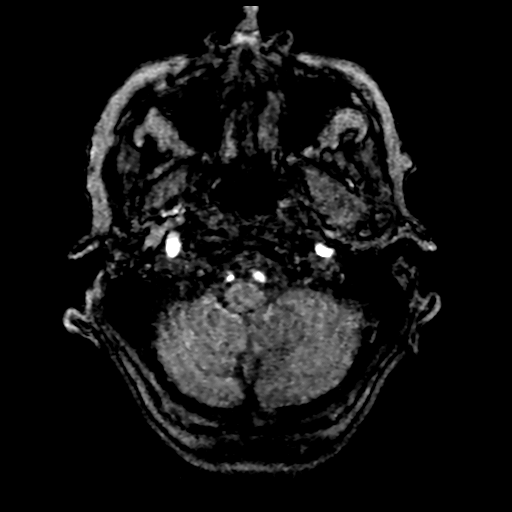
[im 40/205]
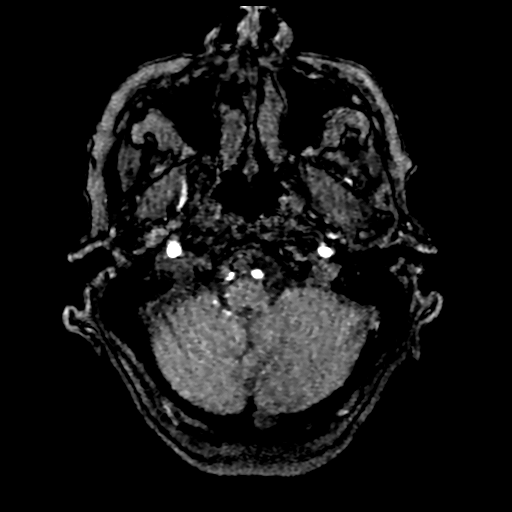
[im 66/205]
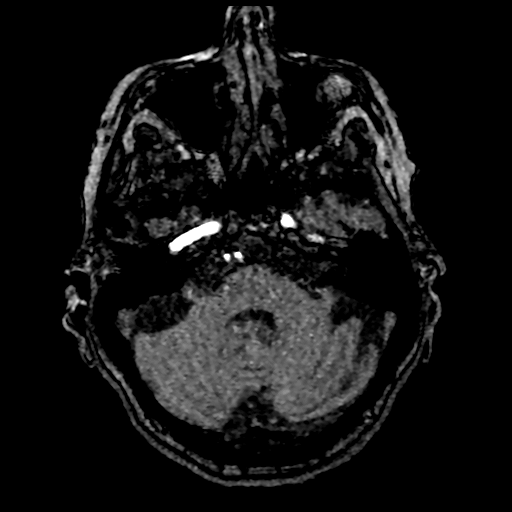
[im 92/205]
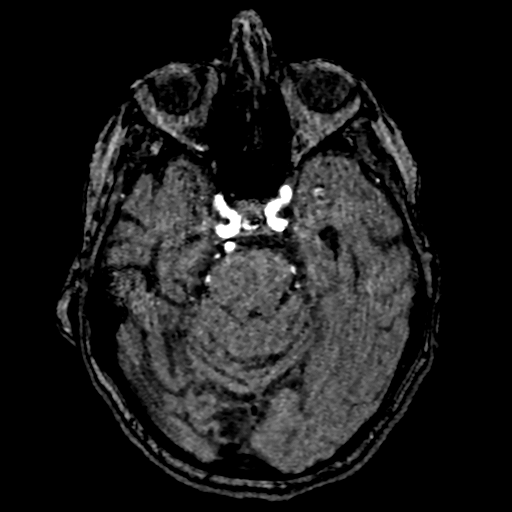
[im 105/205]
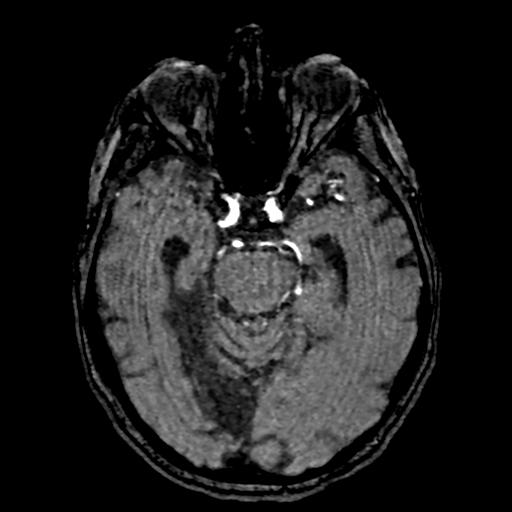
[im 118/205]
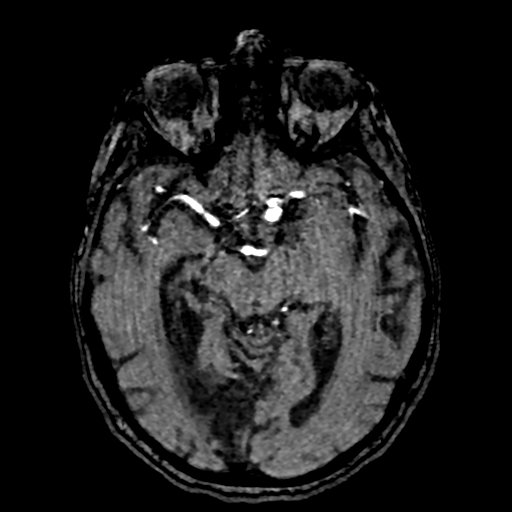
[im 144/205]
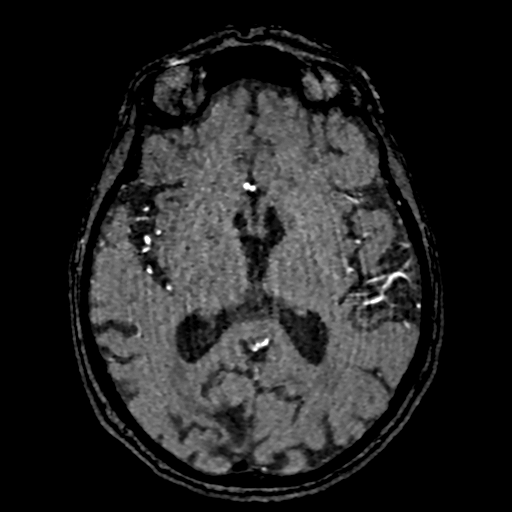
[im 170/205]
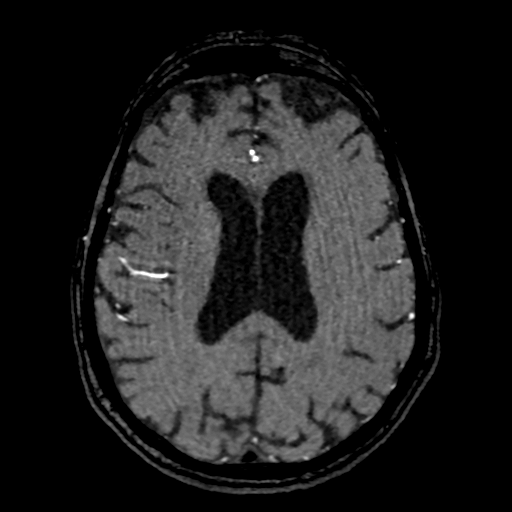
[im 174/205]
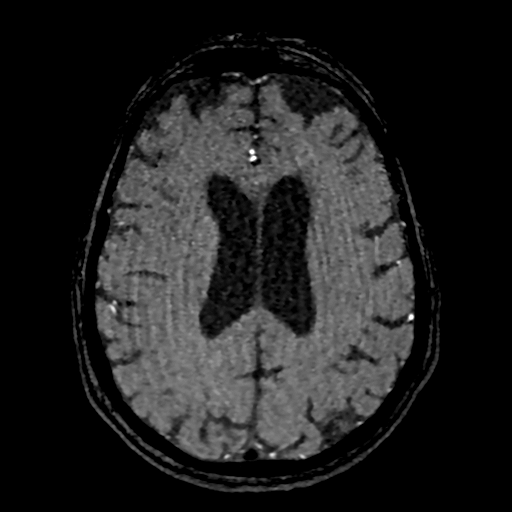
[im 196/205]
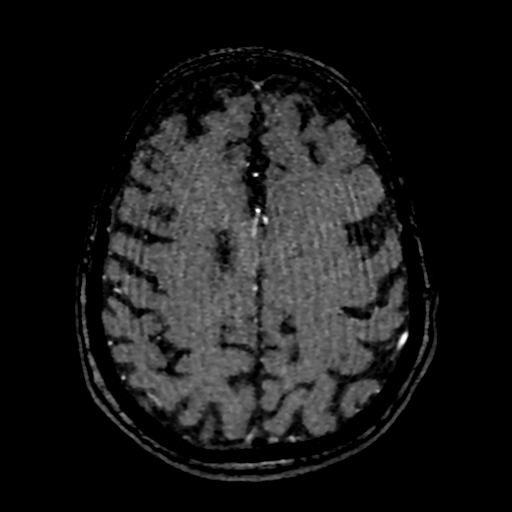

[17 of 48 positions shown; findings below may reference images not displayed]

FINDINGS: MR HEAD FINDINGS

Brain: A moderate-sized infarct inferomedially in the left
cerebellar hemisphere demonstrates mildly reduced ADC and
well-defined T2 hyperintensity, likely early subacute. There is a
similar subcentimeter infarct medially in the right cerebellar
hemisphere. Additional small chronic infarcts are noted in the right
cerebellar hemisphere, and there is also a large chronic right PCA
infarct with a small amount of chronic blood products. T2
hyperintensities elsewhere in the cerebral white matter bilaterally
are nonspecific but compatible with mild chronic small vessel
ischemic disease. There is moderate cerebral atrophy. A partially
empty sella is incidentally noted.

Vascular: Major intracranial vascular flow voids are preserved.
Bilateral supraclinoid ICA aneurysms, more fully evaluated below.

Skull and upper cervical spine: No suspicious marrow lesion.
Moderate to severe disc and facet degeneration in the cervical
spine.

Sinuses/Orbits: Left cataract extraction. Paranasal sinuses and
mastoid air cells are clear.

Other: None.

MRA HEAD FINDINGS

Anterior circulation: The internal carotid arteries are patent from
skull base to carotid termini without evidence of a significant
stenosis. A 6 mm aneurysm projects superiorly from the proximal
right supraclinoid ICA, and there is an approximately 10 x 8 mm
aneurysm projecting posteriorly from the left supraclinoid ICA. ACAs
and MCAs are patent proximally without evidence of a significant A1
or M1 stenosis. The proximal left M1 segment may be fenestrated,
however assessment is limited by motion artifact. There is an
irregular, attenuated appearance of multiple MCA branch vessels
bilaterally, most notably in the superior division of the left MCA
with questionable severe mid to distal M2 stenoses versus
artifactual signal loss bilaterally.

Posterior circulation: The intracranial vertebral arteries are
patent to the basilar with moderate stenosis at the left
vertebrobasilar junction. The right PICA is patent. The left PICA
appears to be occluded near its origin. Patent SCA origins are seen
bilaterally. The basilar artery is patent with a mild stenosis in
its midportion. The right PCA is occluded near the P1-P2 junction.
The left PCA is patent with mild proximal and severe mid P2 stenoses
noted. No aneurysm is identified.

Anatomic variants:None.

MRA NECK FINDINGS

Aortic arch: Standard 3 vessel aortic arch with widely patent arch
vessel origins.

Right carotid system: Patent without evidence of a significant
stenosis or dissection.

Left carotid system: Patent without evidence of a significant
stenosis or dissection.

Vertebral arteries: Patent with antegrade flow bilaterally and with
the left being mildly dominant. Severe left V3 segment stenosis. No
evidence of a significant stenosis on the right.
IMPRESSION: 1. Early subacute cerebellar infarcts, left larger than right.
2. Chronic right cerebellar and right PCA infarcts.
3. Occlusion of the left PICA near its origin.
4. Intracranial atherosclerosis with multiple anterior and posterior
circulation stenoses as detailed above.
5. 6 mm right and 10 mm left supraclinoid ICA aneurysms.
6. Severe left V3 stenosis.
7. No cervical carotid artery stenosis.

## 2022-02-14 IMAGING — MR MR HEAD W/O CM
12 series · 48 of 48 positions shown · IV contrast (gadavist)
Comparison: Head CT [DATE]. Carotid Doppler ultrasound
[DATE].

CLINICAL DATA: Neuro deficit, acute, stroke suspected; History of
multiple aneurysms; Carotid artery stenosis screening, risk factors.
Altered mental status. Weakness and nausea.

EXAM:
MRI HEAD WITHOUT CONTRAST
MRA HEAD WITHOUT CONTRAST
MRA OF THE NECK WITHOUT AND WITH CONTRAST
TECHNIQUE: Multiplanar, multi-echo pulse sequences of the brain and surrounding
structures were acquired without intravenous contrast. Angiographic
images of the Circle of Willis were acquired using MRA technique
without intravenous contrast. Angiographic images of the neck were
acquired using MRA technique without and with intravenous contrast.
Carotid stenosis measurements (when applicable) are obtained
utilizing NASCET criteria, using the distal internal carotid
diameter as the denominator.
CONTRAST:  5mL GADAVIST GADOBUTROL 1 MMOL/ML IV SOLN

[Series 5: ax dwi_tracew · axial · 3.0mm · 1.80mm/px · z∈[-10,+145]mm · 4 of 48 slices shown]
[im 1/48]
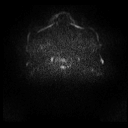
[im 16/48]
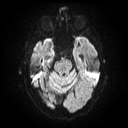
[im 32/48]
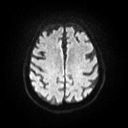
[im 48/48]
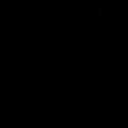

[Series 6: ax dwi_adc · axial · 3.0mm · 1.80mm/px · z∈[-10,+139]mm · 4 of 46 slices shown]
[im 1/46]
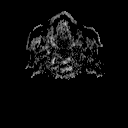
[im 16/46]
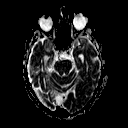
[im 31/46]
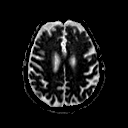
[im 46/46]
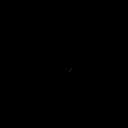

[Series 7: cor dwi_tracew · coronal · 5.0mm · 1.80mm/px · 3 of 38 slices shown]
[im 1/38]
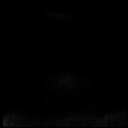
[im 19/38]
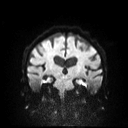
[im 38/38]
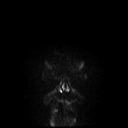

[Series 8: cor dwi_adc · coronal · 5.0mm · 1.80mm/px · 3 of 37 slices shown]
[im 1/37]
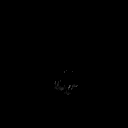
[im 19/37]
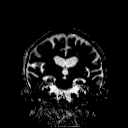
[im 37/37]
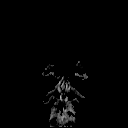

[Series 9: FLAIR · axial · 3.0mm · 0.69mm/px · z∈[-13,+148]mm · 4 of 55 slices shown]
[im 1/55]
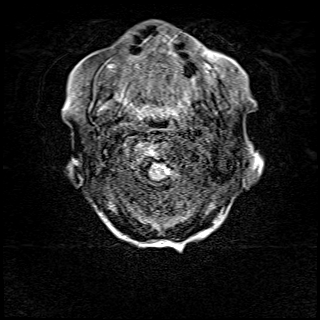
[im 19/55]
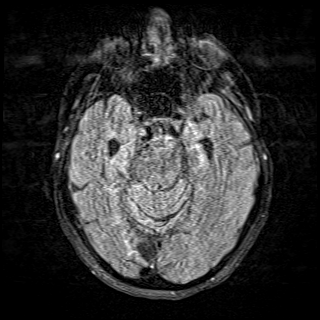
[im 37/55]
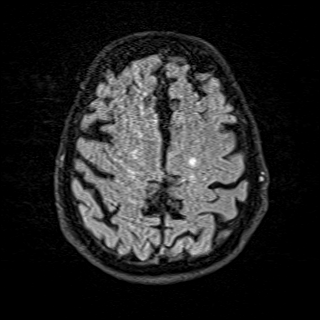
[im 55/55]
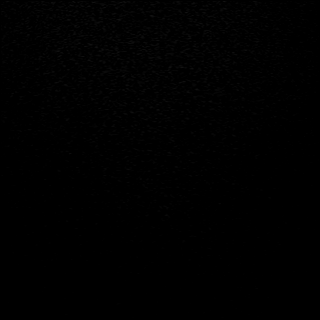

[Series 11: pha_images · axial · 3.0mm · 0.90mm/px · z∈[-9,+144]mm · 4 of 52 slices shown]
[im 1/52]
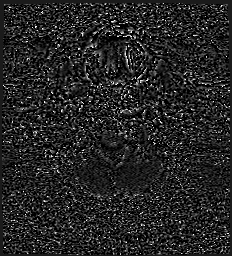
[im 18/52]
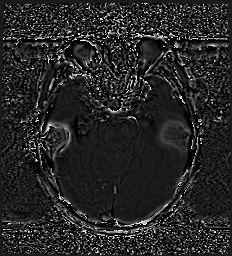
[im 35/52]
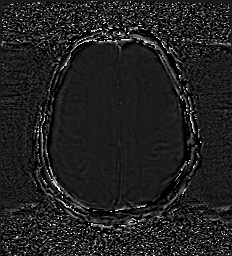
[im 52/52]
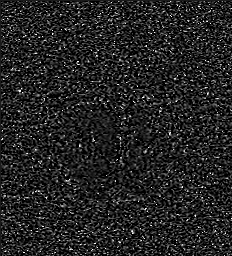

[Series 12: swi_images · axial · 3.0mm · 0.90mm/px · z∈[-9,+144]mm · 4 of 52 slices shown]
[im 1/52]
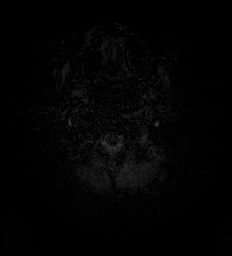
[im 18/52]
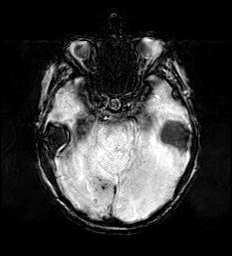
[im 35/52]
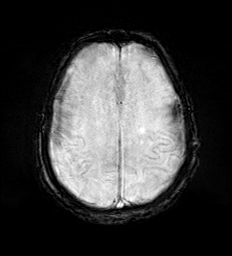
[im 52/52]
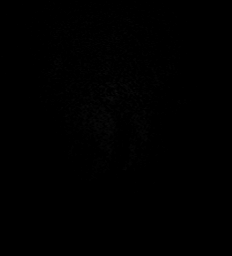

[Series 13: mip_images(sw) · axial · 24.0mm · 0.90mm/px · z∈[+1,+133]mm · 3 of 45 slices shown]
[im 1/45]
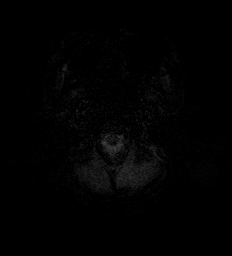
[im 23/45]
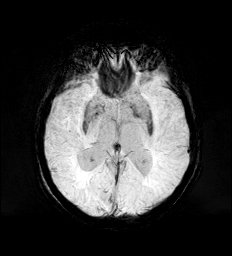
[im 45/45]
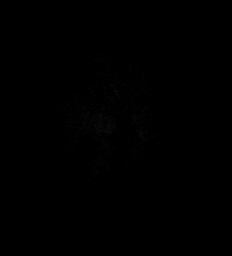

[Series 14: T1 · sagittal · 5.0mm · 0.62mm/px · 2 of 23 slices shown (1 of 2)]
[im 1/23]
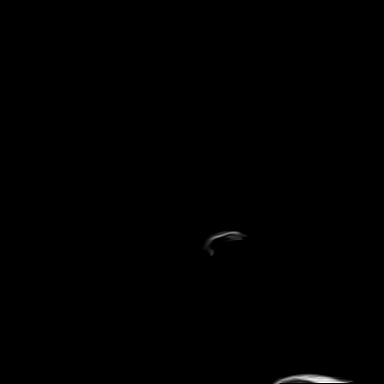
[im 23/23]
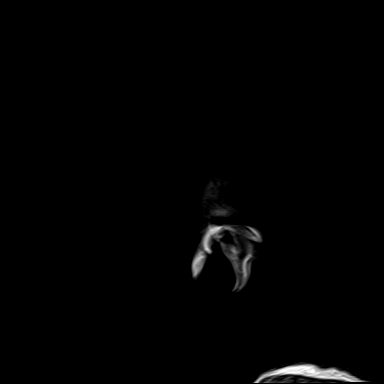

[Series 15: T2 · axial · 5.0mm · 0.86mm/px · z∈[-4,+139]mm · 2 of 25 slices shown (1 of 2)]
[im 1/25]
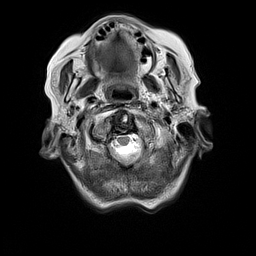
[im 25/25]
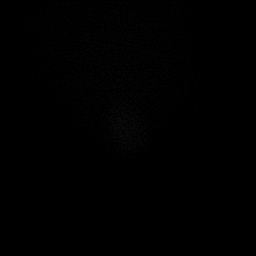

[Series 16: T1 · axial · 1.0mm · 0.98mm/px · z∈[-19,+155]mm · 13 of 175 slices shown (2 of 2)]
[im 1/175]
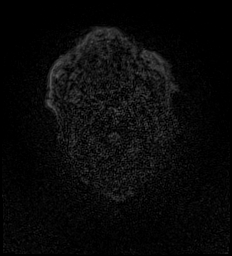
[im 15/175]
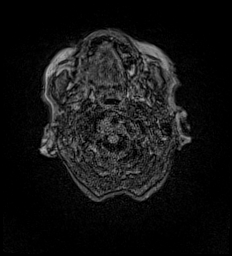
[im 30/175]
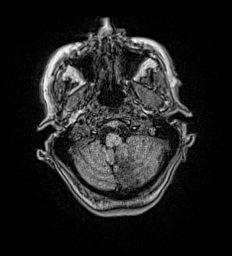
[im 44/175]
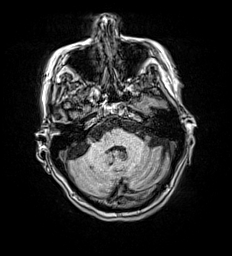
[im 59/175]
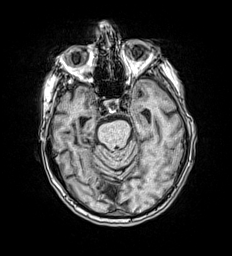
[im 73/175]
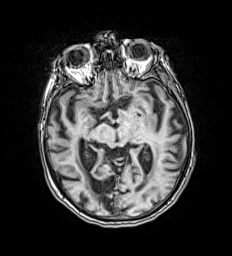
[im 88/175]
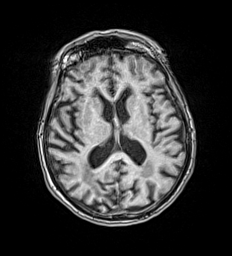
[im 102/175]
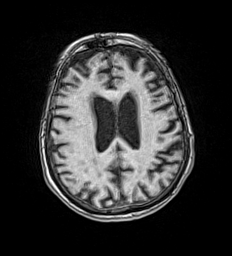
[im 117/175]
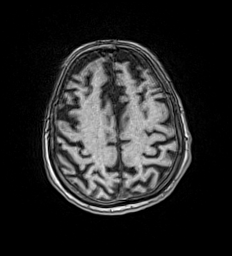
[im 131/175]
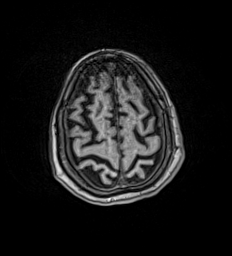
[im 146/175]
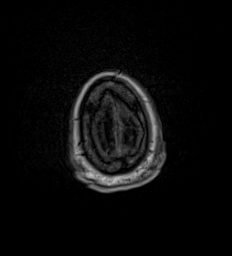
[im 160/175]
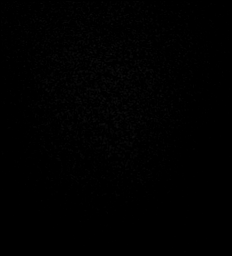
[im 175/175]
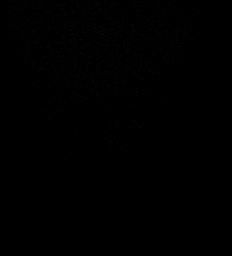

[Series 17: T2 · coronal · 5.0mm · 0.86mm/px · 2 of 29 slices shown (2 of 2)]
[im 1/29]
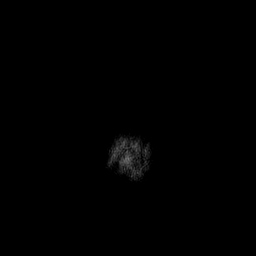
[im 29/29]
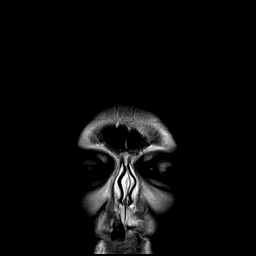

[48 of 48 positions shown; findings below may reference images not displayed]

FINDINGS: MR HEAD FINDINGS

Brain: A moderate-sized infarct inferomedially in the left
cerebellar hemisphere demonstrates mildly reduced ADC and
well-defined T2 hyperintensity, likely early subacute. There is a
similar subcentimeter infarct medially in the right cerebellar
hemisphere. Additional small chronic infarcts are noted in the right
cerebellar hemisphere, and there is also a large chronic right PCA
infarct with a small amount of chronic blood products. T2
hyperintensities elsewhere in the cerebral white matter bilaterally
are nonspecific but compatible with mild chronic small vessel
ischemic disease. There is moderate cerebral atrophy. A partially
empty sella is incidentally noted.

Vascular: Major intracranial vascular flow voids are preserved.
Bilateral supraclinoid ICA aneurysms, more fully evaluated below.

Skull and upper cervical spine: No suspicious marrow lesion.
Moderate to severe disc and facet degeneration in the cervical
spine.

Sinuses/Orbits: Left cataract extraction. Paranasal sinuses and
mastoid air cells are clear.

Other: None.

MRA HEAD FINDINGS

Anterior circulation: The internal carotid arteries are patent from
skull base to carotid termini without evidence of a significant
stenosis. A 6 mm aneurysm projects superiorly from the proximal
right supraclinoid ICA, and there is an approximately 10 x 8 mm
aneurysm projecting posteriorly from the left supraclinoid ICA. ACAs
and MCAs are patent proximally without evidence of a significant A1
or M1 stenosis. The proximal left M1 segment may be fenestrated,
however assessment is limited by motion artifact. There is an
irregular, attenuated appearance of multiple MCA branch vessels
bilaterally, most notably in the superior division of the left MCA
with questionable severe mid to distal M2 stenoses versus
artifactual signal loss bilaterally.

Posterior circulation: The intracranial vertebral arteries are
patent to the basilar with moderate stenosis at the left
vertebrobasilar junction. The right PICA is patent. The left PICA
appears to be occluded near its origin. Patent SCA origins are seen
bilaterally. The basilar artery is patent with a mild stenosis in
its midportion. The right PCA is occluded near the P1-P2 junction.
The left PCA is patent with mild proximal and severe mid P2 stenoses
noted. No aneurysm is identified.

Anatomic variants:None.

MRA NECK FINDINGS

Aortic arch: Standard 3 vessel aortic arch with widely patent arch
vessel origins.

Right carotid system: Patent without evidence of a significant
stenosis or dissection.

Left carotid system: Patent without evidence of a significant
stenosis or dissection.

Vertebral arteries: Patent with antegrade flow bilaterally and with
the left being mildly dominant. Severe left V3 segment stenosis. No
evidence of a significant stenosis on the right.
IMPRESSION: 1. Early subacute cerebellar infarcts, left larger than right.
2. Chronic right cerebellar and right PCA infarcts.
3. Occlusion of the left PICA near its origin.
4. Intracranial atherosclerosis with multiple anterior and posterior
circulation stenoses as detailed above.
5. 6 mm right and 10 mm left supraclinoid ICA aneurysms.
6. Severe left V3 stenosis.
7. No cervical carotid artery stenosis.

## 2022-02-14 IMAGING — CT CT HEAD W/O CM
4 series · 16 of 47 positions shown, 18 images · non-contrast
Comparison: CT [DATE].

CLINICAL DATA: Altered mental status, nontraumatic (Ped 0-17y)
weakness with nausea, eval for VG



[Series 2: head wo · axial · 0.44mm/px · z∈[-108,+7]mm · 7 of 31 slices shown, 9 images]
[im 4/31  brain]
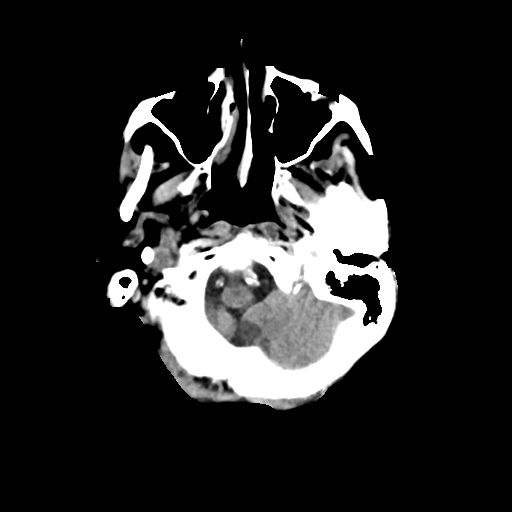
[im 4/31  bone]
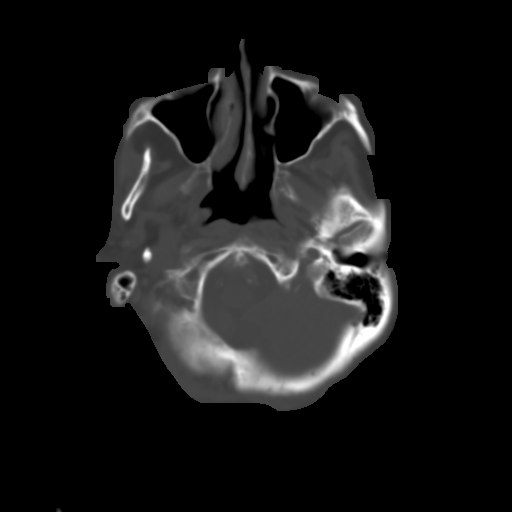
[im 8/31  brain]
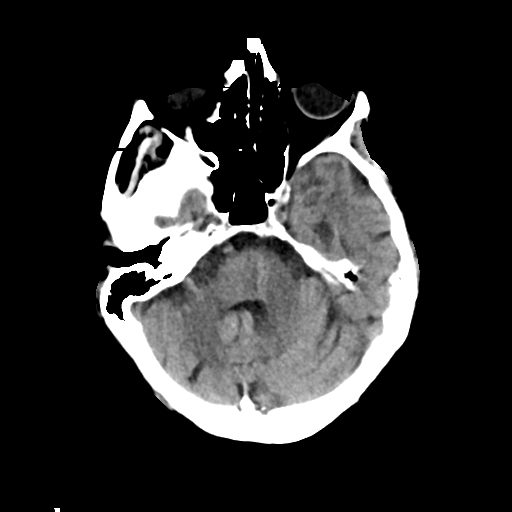
[im 12/31  brain]
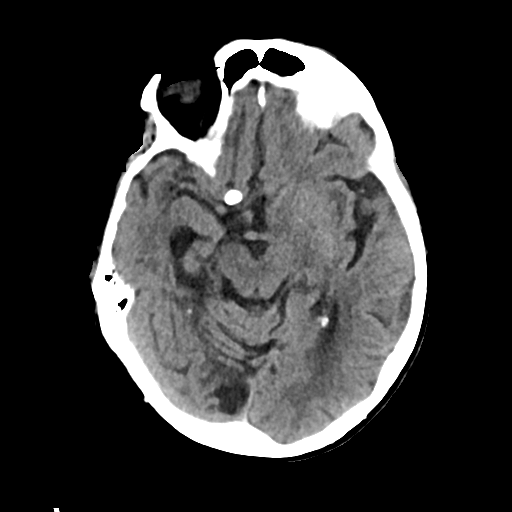
[im 16/31  brain]
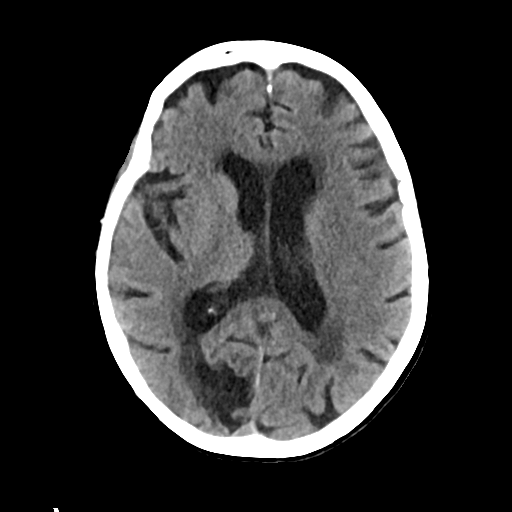
[im 19/31  brain]
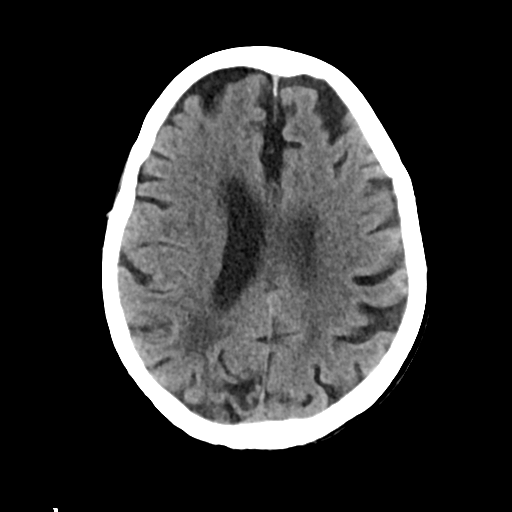
[im 19/31  bone]
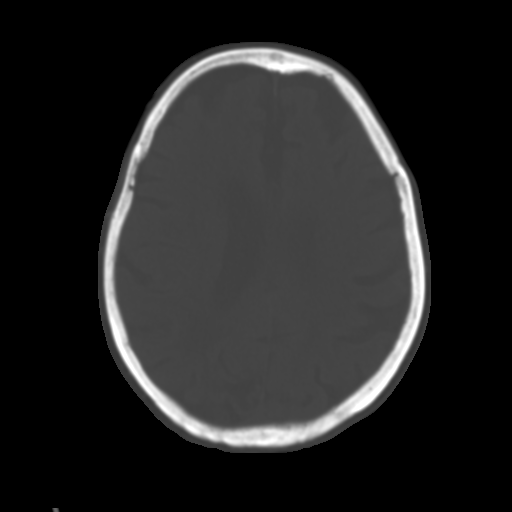
[im 23/31  brain]
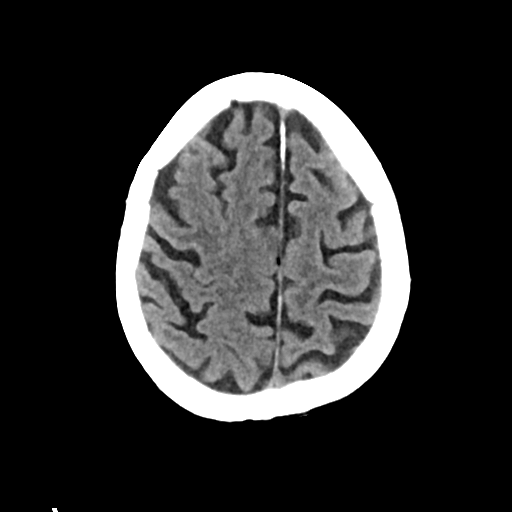
[im 27/31  brain]
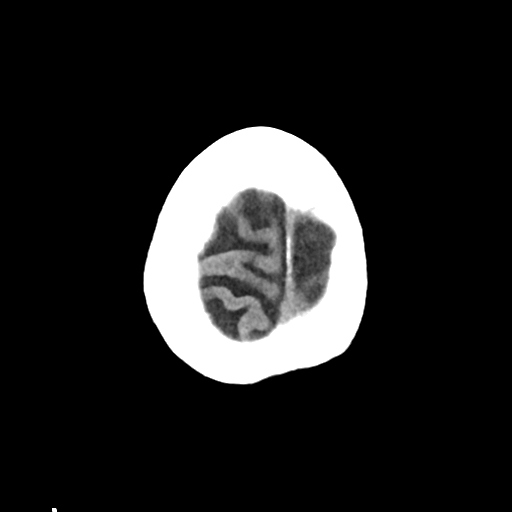

[Series 3: head bone · axial · 0.44mm/px · z∈[-109,-77]mm · 3 of 78 slices shown]
[im 8/78  bone]
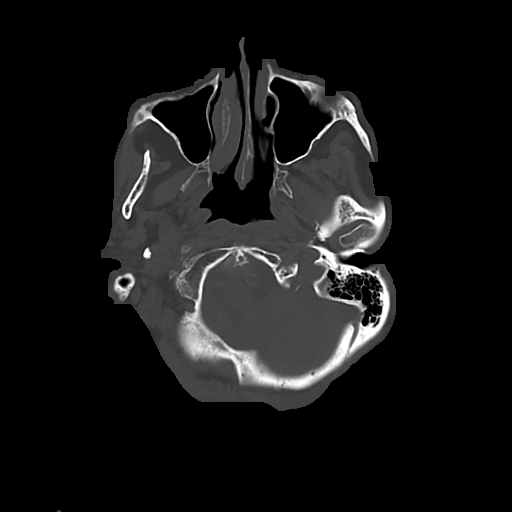
[im 16/78  bone]
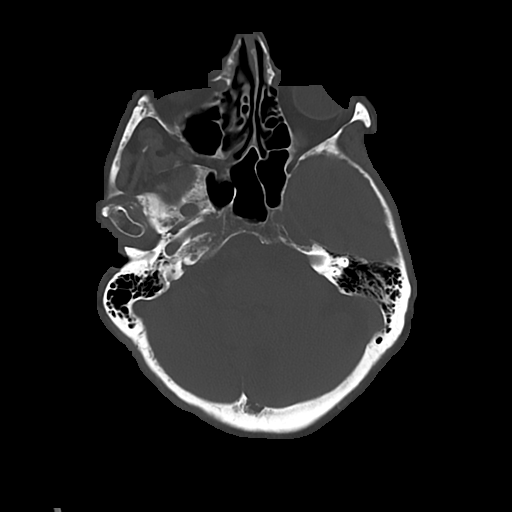
[im 24/78  bone]
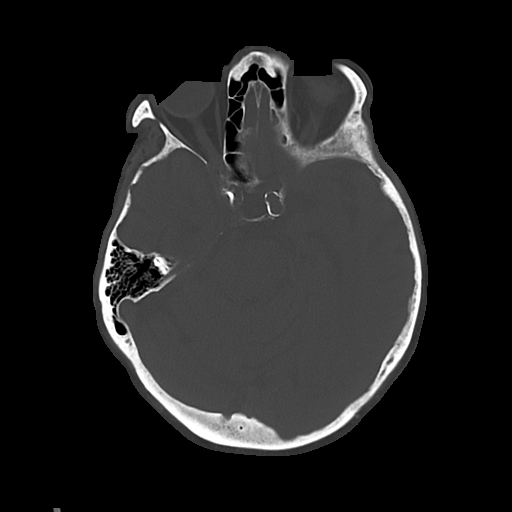

[Series 4: cor soft · coronal · 0.33mm/px · 3 of 66 slices shown]
[im 22/66  brain]
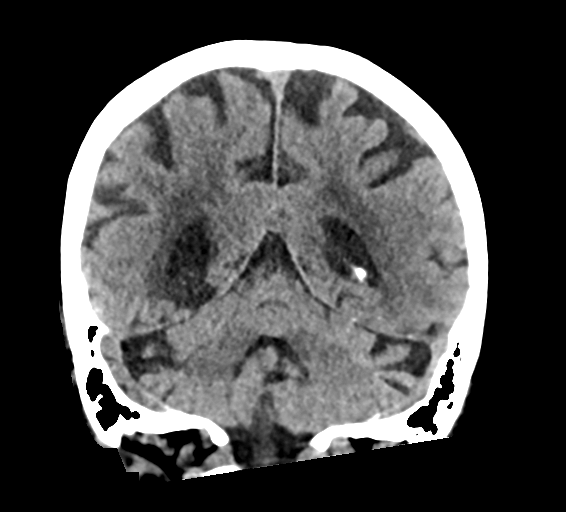
[im 29/66  brain]
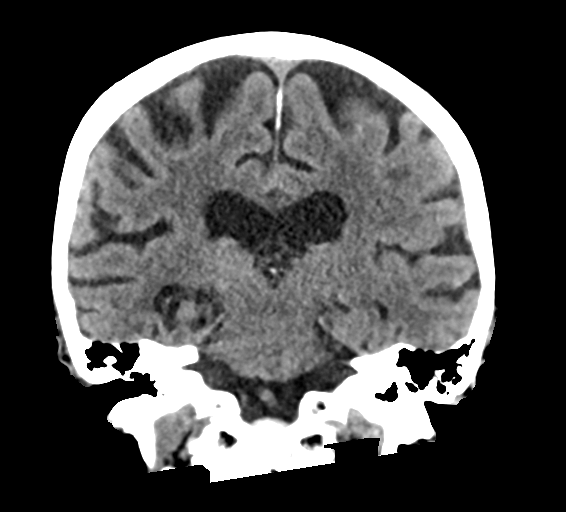
[im 37/66  brain]
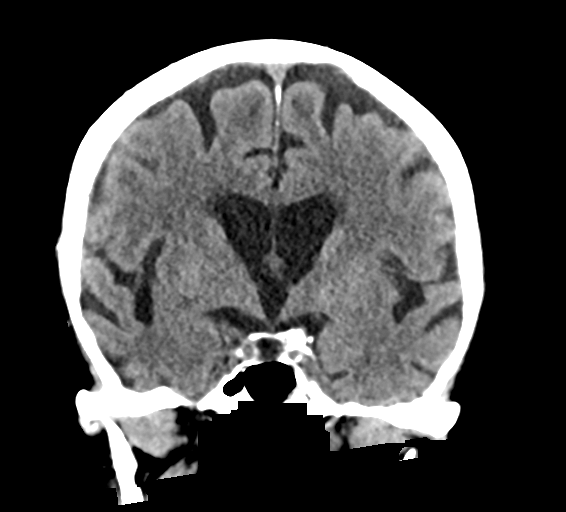

[Series 5: sag soft · sagittal · 0.34mm/px · 3 of 52 slices shown]
[im 19/52  brain]
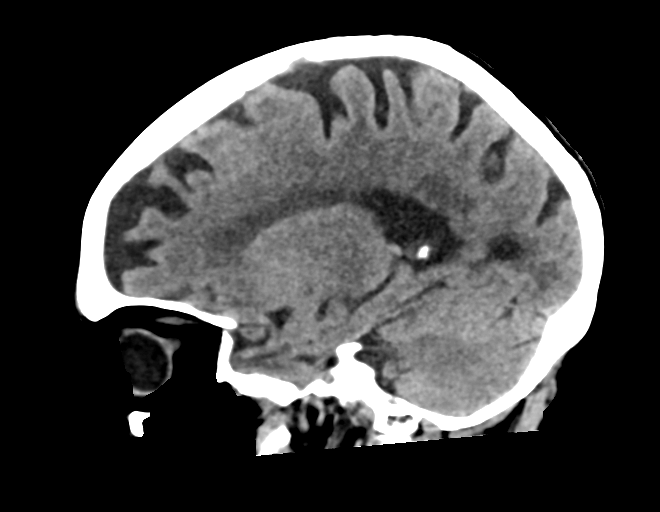
[im 26/52  brain]
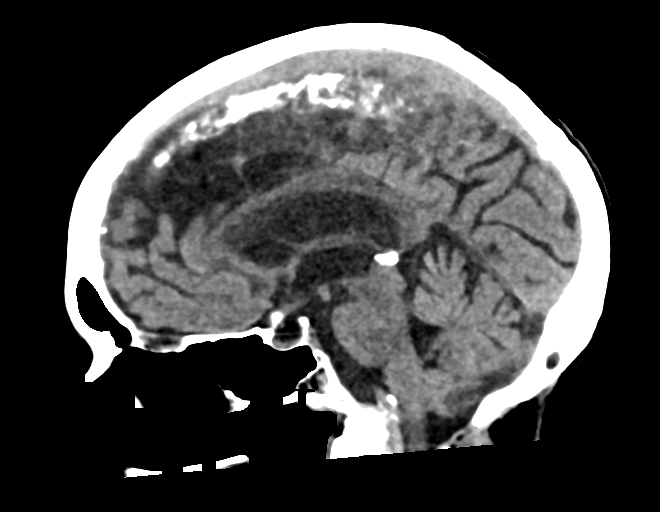
[im 33/52  brain]
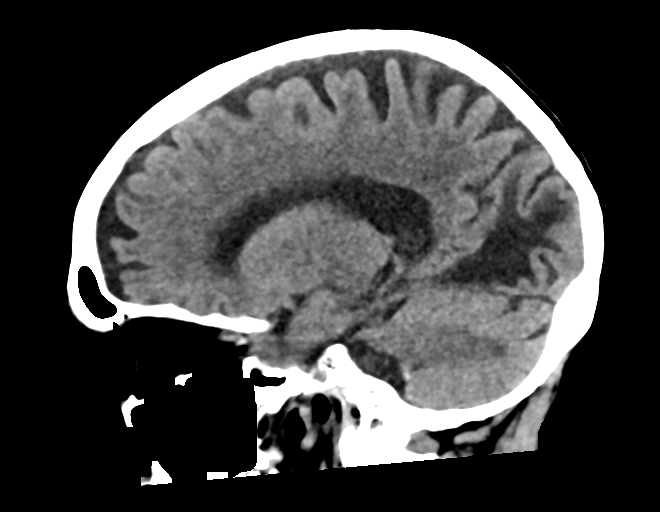

[16 of 47 positions shown; findings below may reference images not displayed]

FINDINGS: Brain: New area of hypoattenuation in the inferior medial left
cerebellum, concerning for acute or subacute infarct (for example
see series 2, image 5). Mild hyperdensity in this region may
represent petechial hemorrhage. No mass occupying acute hemorrhage.
Multiple remote infarcts in the right cerebellum. Remote right PCA
territory infarct. No midline shift. Basal cisterns are patent. No
hydrocephalus. Cerebral atrophy. Additional patchy white matter
hypoattenuation, nonspecific but compatible with chronic
microvascular ischemic disease.

Vascular: Calcific intracranial atherosclerosis. No hyperdense
vessel identified.

Skull: No acute fracture.

Sinuses/Orbits: Visualized sinuses are clear. No acute orbital
findings.

Other: No mastoid effusions.
IMPRESSION: 1. Findings concerning for acute or subacute infarct in the inferior
left cerebellum. Mild hyperdensity in this region may represent
petechial hemorrhage. No mass occupying acute hemorrhage. Recommend
MRI to further evaluate.
2. Multiple remote infarcts, detailed above.

Findings discussed with Dr. VG via telephone at [DATE].

## 2022-02-14 IMAGING — MR MR MRA NECK WO/W CM
4 of 5 series · 36 of 48 positions shown · IV contrast (5ml Gadavist)
Comparison: Head CT [DATE]. Carotid Doppler ultrasound
[DATE].

CLINICAL DATA: Neuro deficit, acute, stroke suspected; History of
multiple aneurysms; Carotid artery stenosis screening, risk factors.
Altered mental status. Weakness and nausea.

EXAM:
MRI HEAD WITHOUT CONTRAST
MRA HEAD WITHOUT CONTRAST
MRA OF THE NECK WITHOUT AND WITH CONTRAST
TECHNIQUE: Multiplanar, multi-echo pulse sequences of the brain and surrounding
structures were acquired without intravenous contrast. Angiographic
images of the Circle of Willis were acquired using MRA technique
without intravenous contrast. Angiographic images of the neck were
acquired using MRA technique without and with intravenous contrast.
Carotid stenosis measurements (when applicable) are obtained
utilizing NASCET criteria, using the distal internal carotid
diameter as the denominator.
CONTRAST:  5mL GADAVIST GADOBUTROL 1 MMOL/ML IV SOLN

[Series 9: angio_fl3d_cor_pre_ttc=2.0s · coronal · 0.9mm · 0.85mm/px · 9 of 96 slices shown]
[im 1/96]
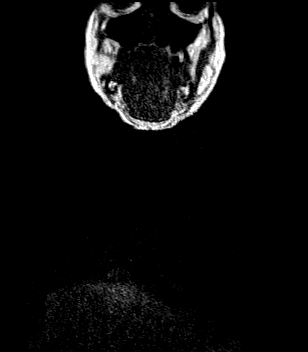
[im 12/96]
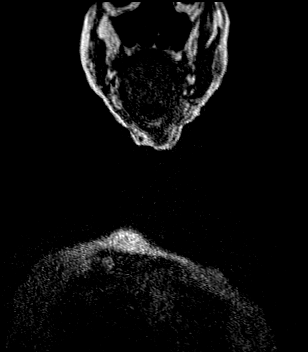
[im 24/96]
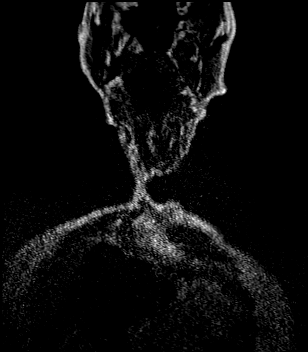
[im 36/96]
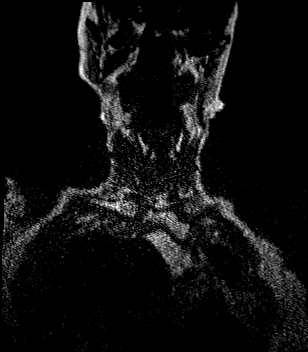
[im 48/96]
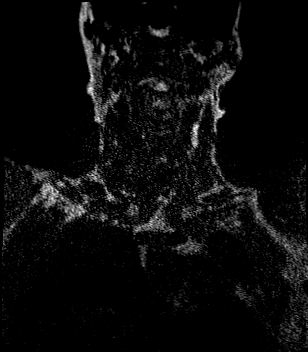
[im 60/96]
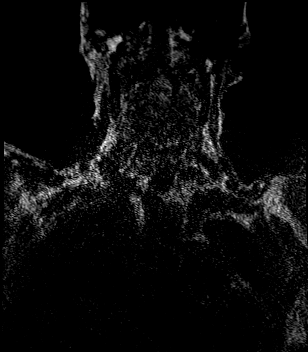
[im 72/96]
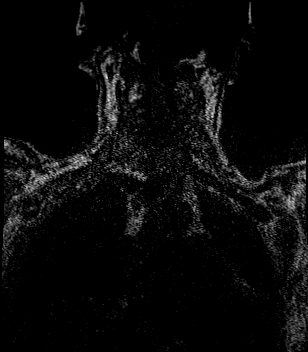
[im 84/96]
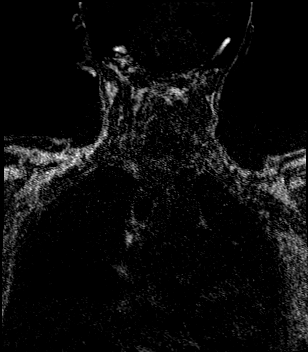
[im 96/96]
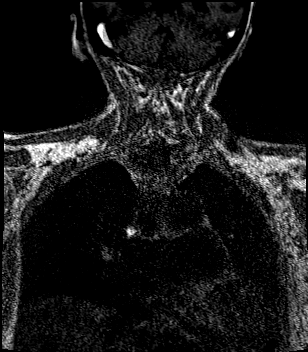

[Series 11: angio_fl3d_cor_post_ttc=2.0s · coronal · 0.9mm · 0.85mm/px · 9 of 96 slices shown]
[im 1/96]
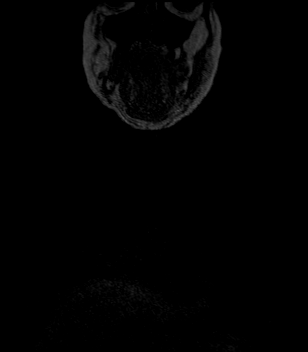
[im 12/96]
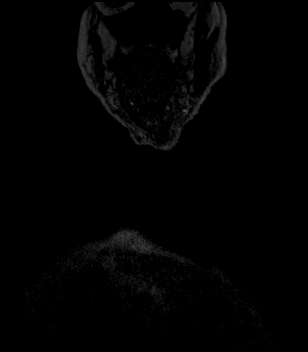
[im 24/96]
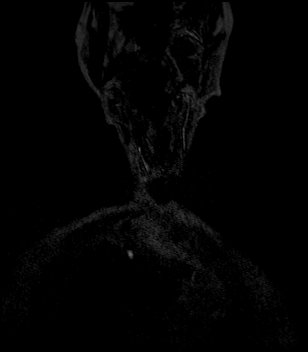
[im 36/96]
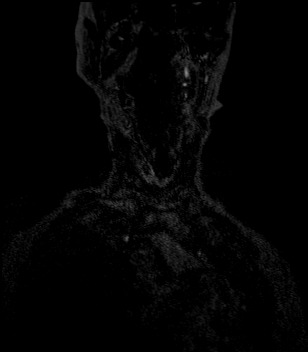
[im 48/96]
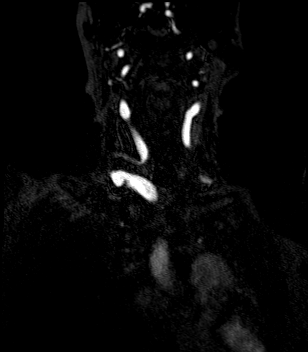
[im 60/96]
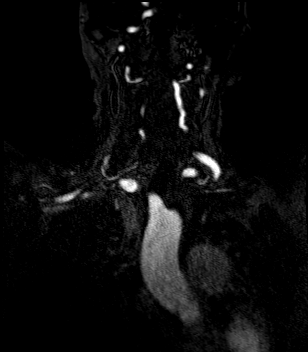
[im 72/96]
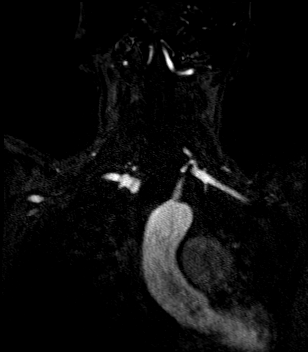
[im 84/96]
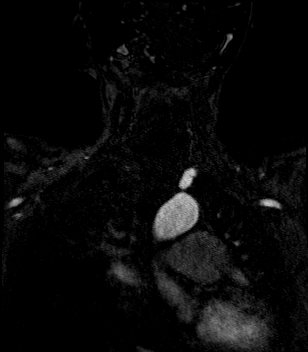
[im 96/96]
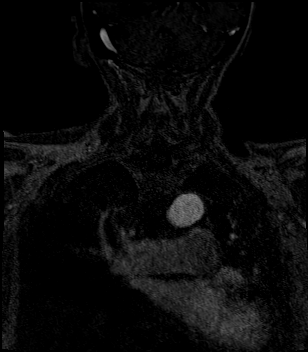

[Series 12: angio_fl3d_cor_post_ttc=2.0s_moco-adv · coronal · 0.9mm · 0.85mm/px · 9 of 96 slices shown]
[im 1/96]
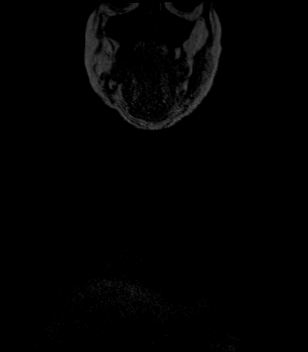
[im 12/96]
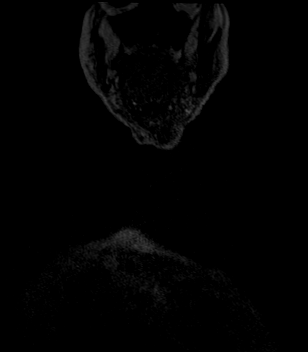
[im 24/96]
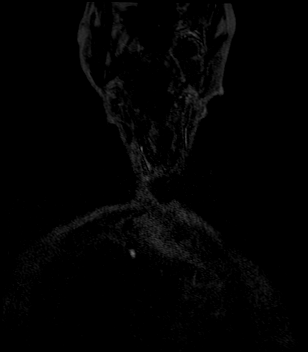
[im 36/96]
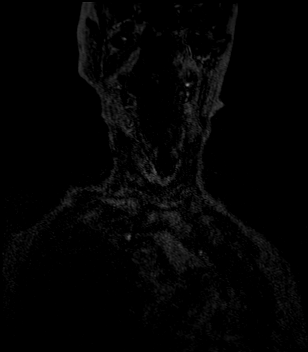
[im 48/96]
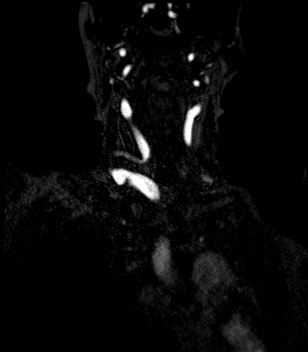
[im 60/96]
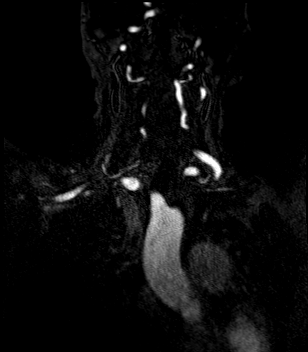
[im 72/96]
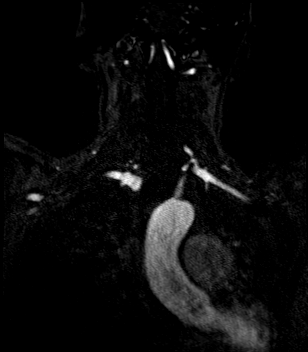
[im 84/96]
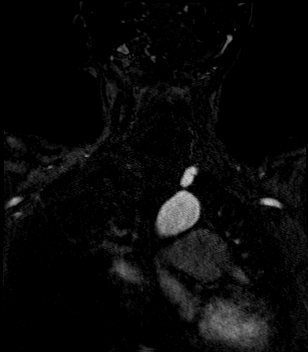
[im 96/96]
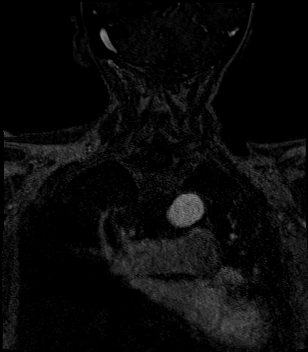

[Series 13: angio_fl3d_cor_post_ttc=2.0s_moco-adv_sub · coronal · 0.9mm · 0.85mm/px · 9 of 96 slices shown]
[im 1/96]
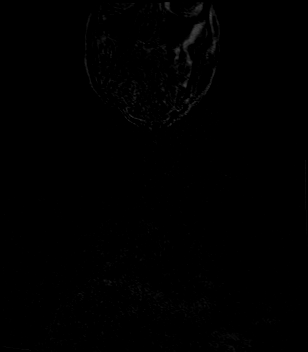
[im 12/96]
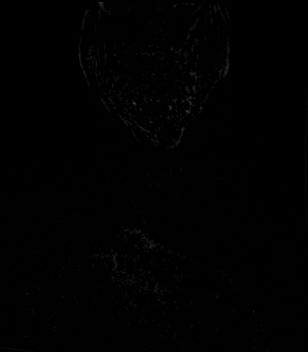
[im 24/96]
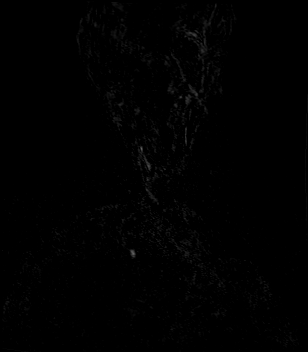
[im 36/96]
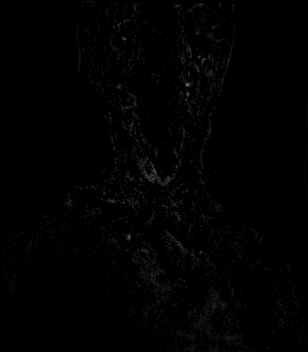
[im 48/96]
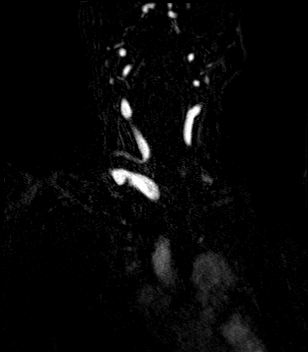
[im 60/96]
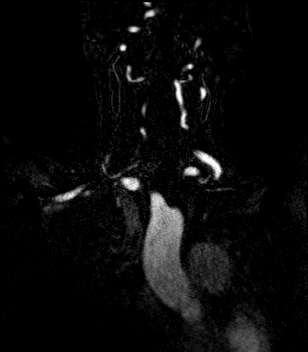
[im 72/96]
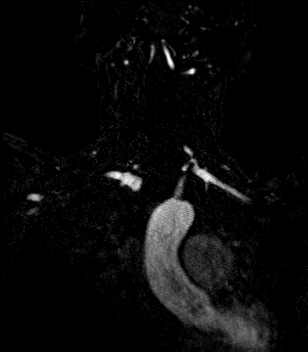
[im 84/96]
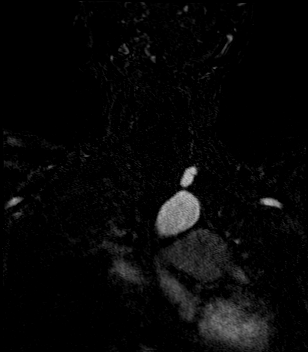
[im 96/96]
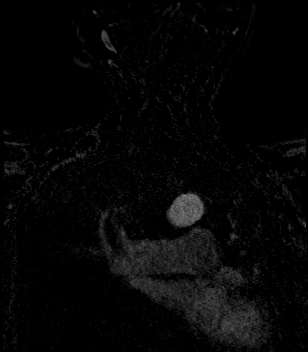

[36 of 48 positions shown; findings below may reference images not displayed]

FINDINGS: MR HEAD FINDINGS

Brain: A moderate-sized infarct inferomedially in the left
cerebellar hemisphere demonstrates mildly reduced ADC and
well-defined T2 hyperintensity, likely early subacute. There is a
similar subcentimeter infarct medially in the right cerebellar
hemisphere. Additional small chronic infarcts are noted in the right
cerebellar hemisphere, and there is also a large chronic right PCA
infarct with a small amount of chronic blood products. T2
hyperintensities elsewhere in the cerebral white matter bilaterally
are nonspecific but compatible with mild chronic small vessel
ischemic disease. There is moderate cerebral atrophy. A partially
empty sella is incidentally noted.

Vascular: Major intracranial vascular flow voids are preserved.
Bilateral supraclinoid ICA aneurysms, more fully evaluated below.

Skull and upper cervical spine: No suspicious marrow lesion.
Moderate to severe disc and facet degeneration in the cervical
spine.

Sinuses/Orbits: Left cataract extraction. Paranasal sinuses and
mastoid air cells are clear.

Other: None.

MRA HEAD FINDINGS

Anterior circulation: The internal carotid arteries are patent from
skull base to carotid termini without evidence of a significant
stenosis. A 6 mm aneurysm projects superiorly from the proximal
right supraclinoid ICA, and there is an approximately 10 x 8 mm
aneurysm projecting posteriorly from the left supraclinoid ICA. ACAs
and MCAs are patent proximally without evidence of a significant A1
or M1 stenosis. The proximal left M1 segment may be fenestrated,
however assessment is limited by motion artifact. There is an
irregular, attenuated appearance of multiple MCA branch vessels
bilaterally, most notably in the superior division of the left MCA
with questionable severe mid to distal M2 stenoses versus
artifactual signal loss bilaterally.

Posterior circulation: The intracranial vertebral arteries are
patent to the basilar with moderate stenosis at the left
vertebrobasilar junction. The right PICA is patent. The left PICA
appears to be occluded near its origin. Patent SCA origins are seen
bilaterally. The basilar artery is patent with a mild stenosis in
its midportion. The right PCA is occluded near the P1-P2 junction.
The left PCA is patent with mild proximal and severe mid P2 stenoses
noted. No aneurysm is identified.

Anatomic variants:None.

MRA NECK FINDINGS

Aortic arch: Standard 3 vessel aortic arch with widely patent arch
vessel origins.

Right carotid system: Patent without evidence of a significant
stenosis or dissection.

Left carotid system: Patent without evidence of a significant
stenosis or dissection.

Vertebral arteries: Patent with antegrade flow bilaterally and with
the left being mildly dominant. Severe left V3 segment stenosis. No
evidence of a significant stenosis on the right.
IMPRESSION: 1. Early subacute cerebellar infarcts, left larger than right.
2. Chronic right cerebellar and right PCA infarcts.
3. Occlusion of the left PICA near its origin.
4. Intracranial atherosclerosis with multiple anterior and posterior
circulation stenoses as detailed above.
5. 6 mm right and 10 mm left supraclinoid ICA aneurysms.
6. Severe left V3 stenosis.
7. No cervical carotid artery stenosis.

## 2022-02-14 IMAGING — CT CT ABD-PELV W/ CM
2 of 5 series · 16 of 46 positions shown, 18 images · IV contrast (APPLIED)
Comparison: [DATE] and previous

CLINICAL DATA: Nausea, vomiting

EXAM:
CT ABDOMEN AND PELVIS WITH CONTRAST
TECHNIQUE: Multidetector CT imaging of the abdomen and pelvis was performed
using the standard protocol following bolus administration of
intravenous contrast.

[Series 4: abdomen 5.0 · axial · 0.69mm/px · z∈[-668,-323]mm · 13 of 81 slices shown, 15 images]
[im 6/81  soft-tissue]
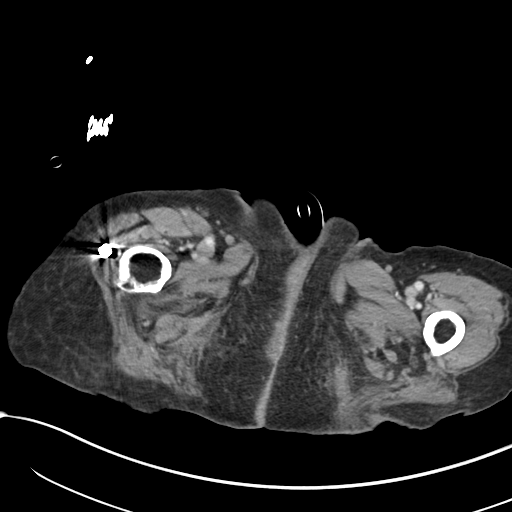
[im 6/81  bone]
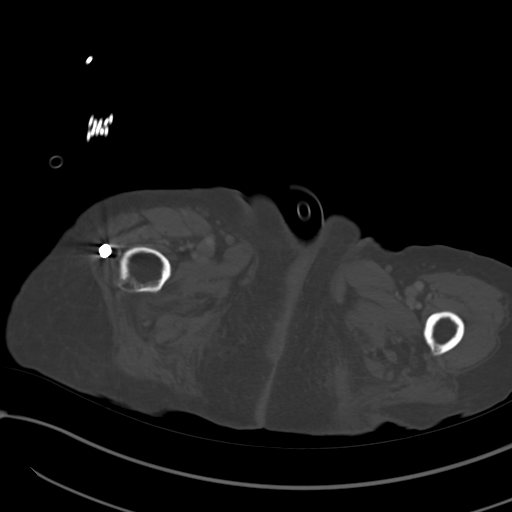
[im 12/81  soft-tissue]
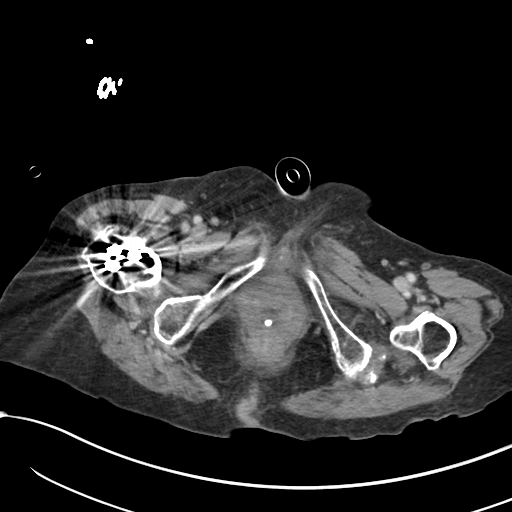
[im 18/81  soft-tissue]
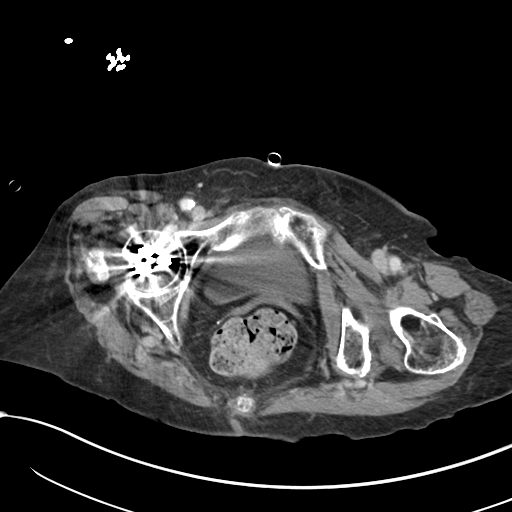
[im 23/81  soft-tissue]
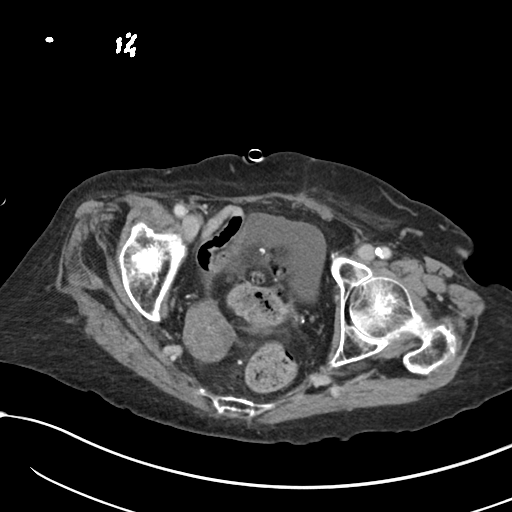
[im 29/81  soft-tissue]
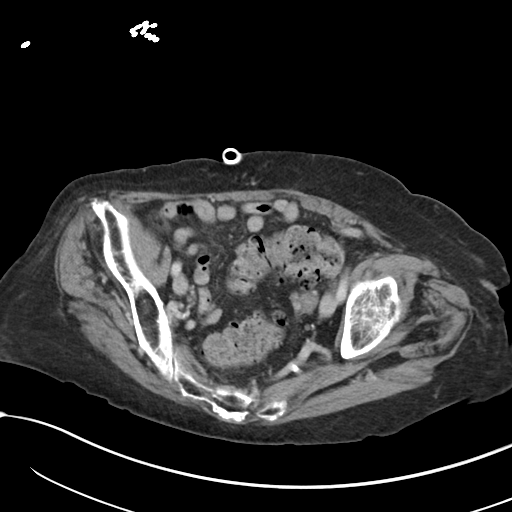
[im 35/81  soft-tissue]
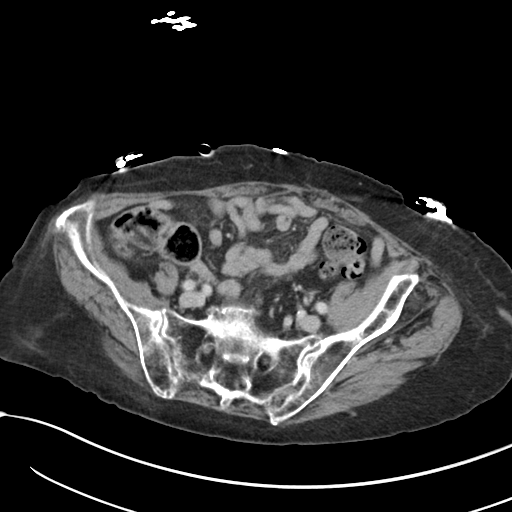
[im 41/81  soft-tissue]
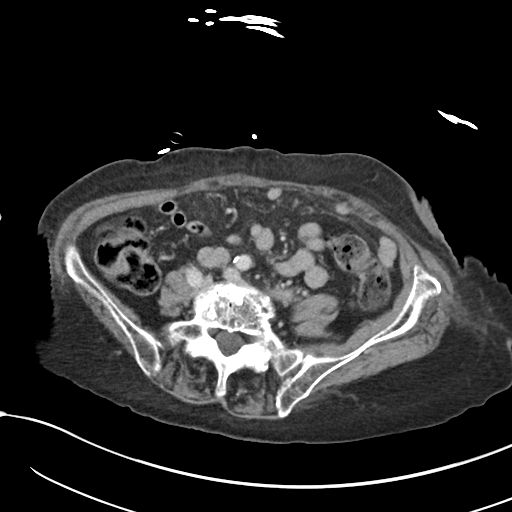
[im 46/81  soft-tissue]
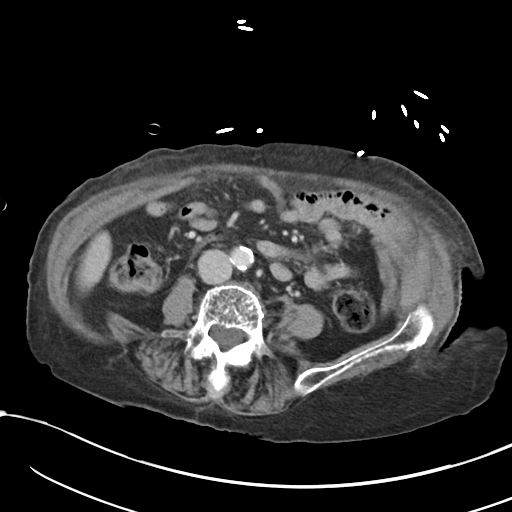
[im 52/81  soft-tissue]
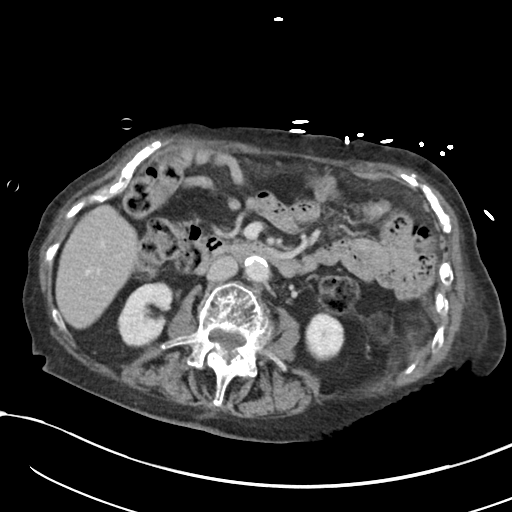
[im 52/81  bone]
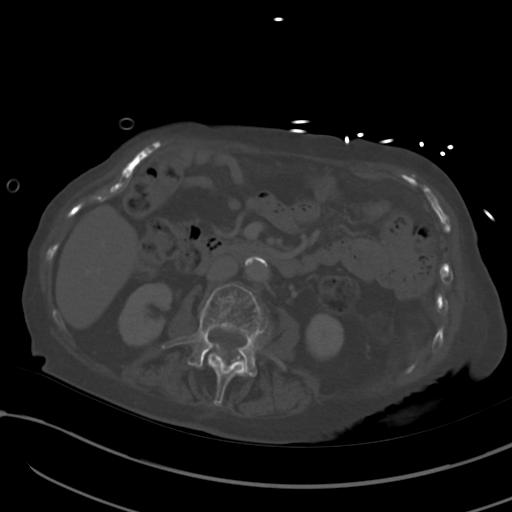
[im 58/81  soft-tissue]
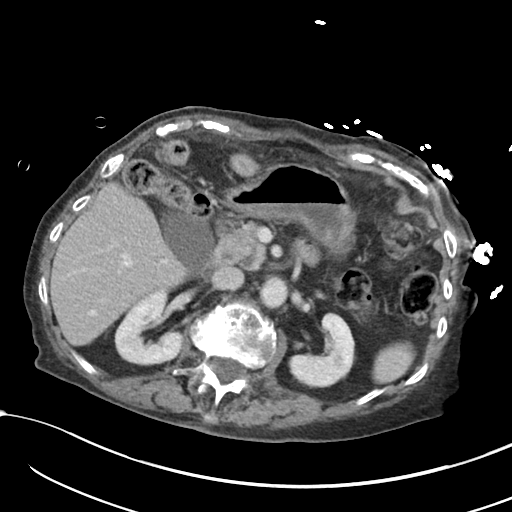
[im 63/81  soft-tissue]
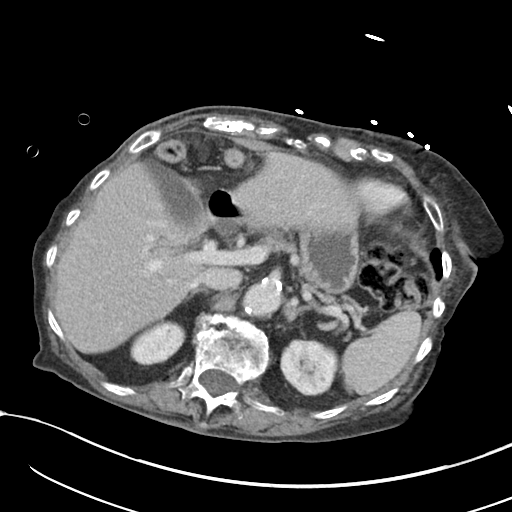
[im 69/81  soft-tissue]
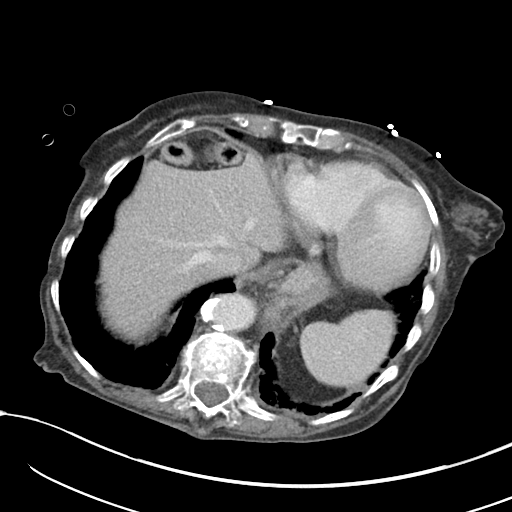
[im 75/81  soft-tissue]
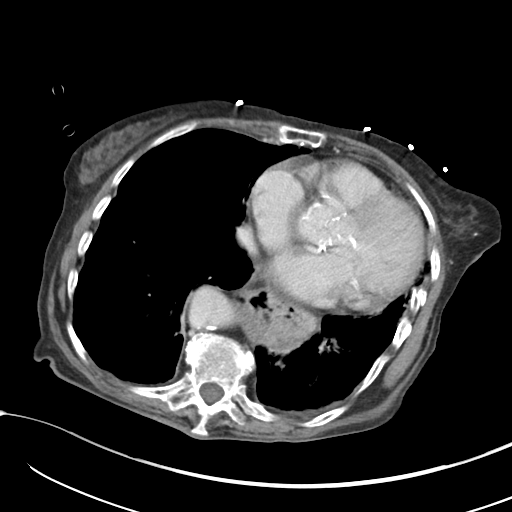

[Series 7: abdomen 3.0 mpr cor · coronal · 0.79mm/px · 3 of 82 slices shown]
[im 28/82  soft-tissue]
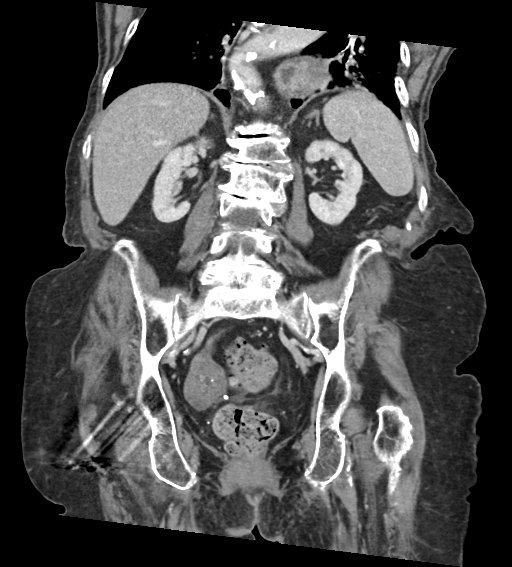
[im 37/82  soft-tissue]
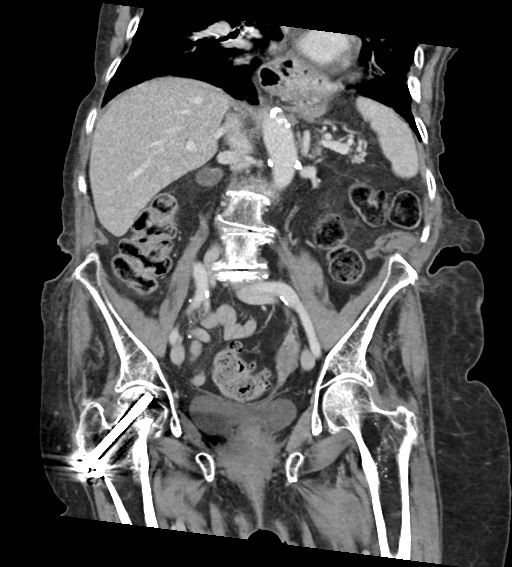
[im 46/82  soft-tissue]
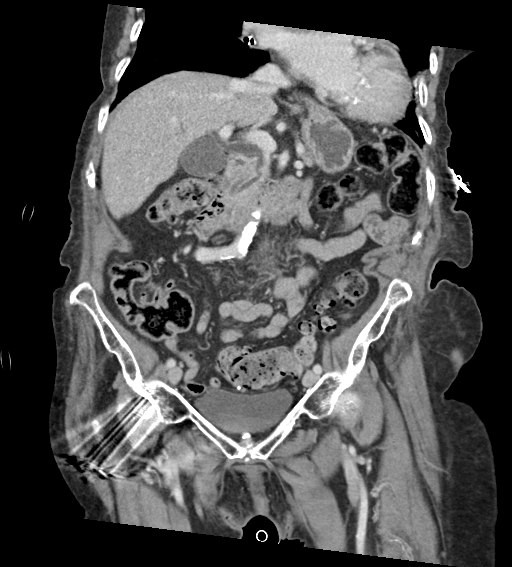

[16 of 46 positions shown; findings below may reference images not displayed]

RADIATION DOSE REDUCTION: This exam was performed according to the
departmental dose-optimization program which includes automated
exposure control, adjustment of the mA and/or kV according to
patient size and/or use of iterative reconstruction technique.

CONTRAST:  75mL OMNIPAQUE IOHEXOL 300 MG/ML  SOLN
FINDINGS: Lower chest: Coronary calcifications. No pleural or pericardial
effusion. Moderate hiatal hernia. Minimal dependent atelectasis in
the lung bases.

Hepatobiliary: No focal liver abnormality is seen. No gallstones,
gallbladder wall thickening, or biliary dilatation.

Pancreas: Unremarkable. No pancreatic ductal dilatation or
surrounding inflammatory changes.

Spleen: Normal in size without focal abnormality.

Adrenals/Urinary Tract: Adrenal glands are unremarkable. Kidneys are
normal, without renal calculi, focal lesion, or hydronephrosis.
Bladder is unremarkable.

Stomach/Bowel: Moderate hiatal hernia. The stomach is incompletely
distended. Small bowel decompressed. Normal appendix. The colon is
nondilated, with scattered descending and sigmoid diverticula
without adjacent inflammatory change.

Vascular/Lymphatic: Moderate calcified aortoiliac atheromatous
plaque without aneurysm or evident stenosis. No abdominal or pelvic
adenopathy.

Reproductive: Status post hysterectomy. 4.2 cm right ovary,
previously 4.6 on [DATE]. No adnexal masses.

Other: No ascites.  No free air.

Musculoskeletal: Orthopedic hardware across the right femoral neck.
Chronic compression deformities of T11, T12, L1, and L2. Mild lumbar
levoscoliosis with multilevel spondylitic change.
IMPRESSION: 1. No acute findings.
2. Coronary and Aortic Atherosclerosis ([4W]-170.0).
3. Colonic diverticulosis
4. Hiatal hernia
5. Stable thoracolumbar compression deformities

## 2022-02-14 MED ORDER — ONDANSETRON HCL 4 MG/2ML IJ SOLN
4.0000 mg | Freq: Once | INTRAMUSCULAR | Status: AC
  Administered 2022-02-14: 4 mg via INTRAVENOUS
  Filled 2022-02-14: qty 2

## 2022-02-14 MED ORDER — QUETIAPINE FUMARATE 25 MG PO TABS
25.0000 mg | ORAL_TABLET | Freq: Every day | ORAL | Status: DC
  Administered 2022-02-14: 25 mg via ORAL
  Filled 2022-02-14: qty 1

## 2022-02-14 MED ORDER — ASPIRIN 81 MG PO CHEW
81.0000 mg | CHEWABLE_TABLET | Freq: Every day | ORAL | Status: DC
  Administered 2022-02-14 – 2022-02-15 (×2): 81 mg via ORAL
  Filled 2022-02-14 (×2): qty 1

## 2022-02-14 MED ORDER — STROKE: EARLY STAGES OF RECOVERY BOOK: Freq: Once | Status: AC

## 2022-02-14 MED ORDER — ENOXAPARIN SODIUM 30 MG/0.3ML IJ SOSY
30.0000 mg | PREFILLED_SYRINGE | INTRAMUSCULAR | Status: DC
  Administered 2022-02-14: 30 mg via SUBCUTANEOUS
  Filled 2022-02-14: qty 0.3

## 2022-02-14 MED ORDER — SODIUM CHLORIDE 0.9 % IV BOLUS: 500.0000 mL | Freq: Once | INTRAVENOUS | Status: DC

## 2022-02-14 MED ORDER — HYDROCODONE-ACETAMINOPHEN 5-325 MG PO TABS
0.5000 | ORAL_TABLET | Freq: Four times a day (QID) | ORAL | Status: DC
  Administered 2022-02-14 – 2022-02-15 (×4): 0.5 via ORAL
  Filled 2022-02-14 (×4): qty 1

## 2022-02-14 MED ORDER — ONDANSETRON HCL 4 MG/2ML IJ SOLN
4.0000 mg | INTRAMUSCULAR | Status: AC
  Administered 2022-02-14: 4 mg via INTRAVENOUS
  Filled 2022-02-14: qty 2

## 2022-02-14 MED ORDER — SODIUM CHLORIDE 0.9 % IV BOLUS
500.0000 mL | Freq: Once | INTRAVENOUS | Status: AC
  Administered 2022-02-14: 500 mL via INTRAVENOUS

## 2022-02-14 MED ORDER — CLOPIDOGREL BISULFATE 75 MG PO TABS
75.0000 mg | ORAL_TABLET | Freq: Every day | ORAL | Status: DC
  Administered 2022-02-14 – 2022-02-15 (×2): 75 mg via ORAL
  Filled 2022-02-14 (×2): qty 1

## 2022-02-14 MED ORDER — ACETAMINOPHEN 160 MG/5ML PO SOLN
650.0000 mg | ORAL | Status: DC | PRN
  Filled 2022-02-14: qty 20.3

## 2022-02-14 MED ORDER — IOHEXOL 300 MG/ML  SOLN
100.0000 mL | Freq: Once | INTRAMUSCULAR | Status: AC | PRN
  Administered 2022-02-14: 75 mL via INTRAVENOUS

## 2022-02-14 MED ORDER — ACETAMINOPHEN 325 MG PO TABS: 650.0000 mg | ORAL_TABLET | ORAL | Status: DC | PRN

## 2022-02-14 MED ORDER — SERTRALINE HCL 50 MG PO TABS
25.0000 mg | ORAL_TABLET | Freq: Every day | ORAL | Status: DC
  Administered 2022-02-14: 25 mg via ORAL
  Filled 2022-02-14: qty 1

## 2022-02-14 MED ORDER — ACETAMINOPHEN 650 MG RE SUPP: 650.0000 mg | RECTAL | Status: DC | PRN

## 2022-02-14 MED ORDER — ALPRAZOLAM 0.25 MG PO TABS
0.2500 mg | ORAL_TABLET | Freq: Two times a day (BID) | ORAL | Status: DC | PRN
  Administered 2022-02-14: 0.25 mg via ORAL
  Filled 2022-02-14: qty 1

## 2022-02-14 MED ORDER — GADOBUTROL 1 MMOL/ML IV SOLN
5.0000 mL | Freq: Once | INTRAVENOUS | Status: AC | PRN
  Administered 2022-02-14: 5 mL via INTRAVENOUS
  Filled 2022-02-14: qty 6

## 2022-02-14 MED ORDER — BUSPIRONE HCL 10 MG PO TABS
10.0000 mg | ORAL_TABLET | Freq: Two times a day (BID) | ORAL | Status: DC
  Administered 2022-02-14 – 2022-02-15 (×3): 10 mg via ORAL
  Filled 2022-02-14: qty 1
  Filled 2022-02-14: qty 2
  Filled 2022-02-14: qty 1

## 2022-02-14 NOTE — ED Triage Notes (Signed)
Pt comes into the ED via [facility] from [facility] c/o Abd pain and N/V that started this morning.  Pt states that she took a new medication last night and thinks it may be related to that.  Medication is PreserVision chewables.  Pt in NAD at this time with even and unlabored respirations.  ?

## 2022-02-14 NOTE — Assessment & Plan Note (Signed)
Blood pressure is stable ?Allow for permissive hypertension ?

## 2022-02-14 NOTE — Assessment & Plan Note (Signed)
Patient with a prior history of CVA who presents to the ER for evaluation of weakness and nausea and is found to have findings concerning for acute or subacute infarct in the inferior ?left cerebellum. Mild hyperdensity in this region may represent ?petechial hemorrhage.  ?Awaiting results of MRI of the brain ?Place patient on aspirin 81 mg daily ?Start patient on atorvastatin ?Allow for permissive hypertension  ?We will request PT/OT/ST consult ?Obtain 2D echocardiogram to assess LVEF and rule out cardiac thrombus ?Consult neurology ?

## 2022-02-14 NOTE — ED Provider Notes (Signed)
? ?[facility] ?Provider Note ? ? ? Event Date/Time  ? First MD Initiated Contact with Patient 02/14/22 [phone]   ?  (approximate) ? ? ?History  ? ?Abdominal Pain and Emesis ? ? ?HPI ? ?Michele [name] is a 88 y.o. female who in accord with October 10, 2021 PCP note which I reviewed has a history of hypertension Alzheimer's dementia chronic kidney disease stage III ? ?Caretakers both of whom are with her and care to her 24/7 give history.  EM caveat Limited by dementia ? ?Patient was in her normal health yesterday evening, this morning got up and said she did not feel she could get out of bed and also felt very nauseated.  She awoke with the symptoms, but on further history caretakers also reports she spent majority of day in the bed felt cold and not quite herself throughout the day yesterday as well ? ?Patient denies any headache chest pain trouble breathing or abdominal pain.  Reports she feels nauseated as though she wants to vomit but has not. ?  ? ? ?Physical Exam  ? ?Triage Vital Signs: ?ED Triage Vitals  ?Enc Vitals Group  ?   BP 02/14/22 0718 (!) 165/62  ?   Pulse Rate 02/14/22 0718 64  ?   Resp 02/14/22 0718 20  ?   Temp 02/14/22 0718 98.1 ?F (36.7 ?C)  ?   Temp Source 02/14/22 0718 Oral  ?   SpO2 02/14/22 0718 97 %  ?   Weight 02/14/22 0716 121 lb 11.1 oz (55.2 kg)  ?   Height 02/14/22 0716 '5\' 2"'$  (1.575 m)  ?   Head Circumference --   ?   Peak Flow --   ?   Pain Score 02/14/22 0717 8  ?   Pain Loc --   ?   Pain Edu? --   ?   Excl. in [place]? --   ? ? ?Most recent vital signs: ?Vitals:  ? 02/14/22 0800 02/14/22 0845  ?BP: (!) 140/97 (!) 148/52  ?Pulse: 72 65  ?Resp: 20 20  ?Temp:    ?SpO2: 98% 95%  ? ? ? ?General: Awake, no distress.  She appears nauseated though holding emesis bag in front of her face but not actively vomiting ?CV:  Good peripheral perfusion.  Notable systolic ejection murmur moderate intensity.  Regular rate ?Resp:  Normal effort.  Clear lungs bilaterally with normal  effort ?Abd:  No distention.  Soft nontender nondistended.  Despite nausea she reports no pain or worsening with palpation of the abdomen ?Other:  Moves all extremities to command.  Generally weak with about 4+ strength in all extremities.  Purposely uses her arms and legs.  Able to lift emesis basin with both bags to her face.  Pupils equal round reactive to light.  No noted facial droop or cranial nerve deficits. ? ? ?ED Results / Procedures / Treatments  ? ?Labs ?(all labs ordered are listed, but only abnormal results are displayed) ?Labs Reviewed  ?COMPREHENSIVE METABOLIC PANEL - Abnormal; Notable for the following components:  ?    Result Value  ? Sodium 133 (*)   ? Glucose, Bld 140 (*)   ? Creatinine, Ser 1.03 (*)   ? Calcium 8.7 (*)   ? Total Protein 6.2 (*)   ? GFR, Estimated 50 (*)   ? All other components within normal limits  ?CBC - Abnormal; Notable for the following components:  ? RBC 3.75 (*)   ? Hemoglobin 10.4 (*)   ?  HCT 33.7 (*)   ? All other components within normal limits  ?LIPASE, BLOOD  ?URINALYSIS, ROUTINE W REFLEX MICROSCOPIC  ?TROPONIN I (HIGH SENSITIVITY)  ? ? ? ?EKG ? ?Reviewed inter by me at 810 ?Heart rate 70 ?QRS 160 ?QTc 500 ?Left bundle branch block without obvious evidence of ischemia ? ? ?RADIOLOGY ? ?Personally viewed and interpreted the patient's CT scan of the head for gross pathology, concern for possible lesion in the cerebellum is noted.  Defer to radiologist for more detailed evaluation.  I also discussed the patient's CT imaging of the head with our radiologist ? ?CT Head Wo Contrast ? ?Result Date: 02/14/2022 ?CLINICAL DATA:  Altered mental status, nontraumatic (Ped 0-17y) weakness with nausea, eval for ICH EXAM: CT HEAD WITHOUT CONTRAST TECHNIQUE: Contiguous axial images were obtained from the base of the skull through the vertex without intravenous contrast. RADIATION DOSE REDUCTION: This exam was performed according to the departmental dose-optimization program which  includes automated exposure control, adjustment of the mA and/or kV according to patient size and/or use of iterative reconstruction technique. COMPARISON:  CT 11/05/2017. FINDINGS: Brain: New area of hypoattenuation in the inferior medial left cerebellum, concerning for acute or subacute infarct (for example see series 2, image 5). Mild hyperdensity in this region may represent petechial hemorrhage. No mass occupying acute hemorrhage. Multiple remote infarcts in the right cerebellum. Remote right PCA territory infarct. No midline shift. Basal cisterns are patent. No hydrocephalus. Cerebral atrophy. Additional patchy white matter hypoattenuation, nonspecific but compatible with chronic microvascular ischemic disease. Vascular: Calcific intracranial atherosclerosis. No hyperdense vessel identified. Skull: No acute fracture. Sinuses/Orbits: Visualized sinuses are clear. No acute orbital findings. Other: No mastoid effusions. IMPRESSION: 1. Findings concerning for acute or subacute infarct in the inferior left cerebellum. Mild hyperdensity in this region may represent petechial hemorrhage. No mass occupying acute hemorrhage. Recommend MRI to further evaluate. 2. Multiple remote infarcts, detailed above. Findings discussed with Dr. Jacqualine Code via telephone at 8:47 AM. Electronically Signed   By: Margaretha Sheffield M.D.   On: 02/14/2022 08:51  ? ?CT ABDOMEN PELVIS W CONTRAST ? ?Result Date: 02/14/2022 ?CLINICAL DATA:  Nausea, vomiting EXAM: CT ABDOMEN AND PELVIS WITH CONTRAST TECHNIQUE: Multidetector CT imaging of the abdomen and pelvis was performed using the standard protocol following bolus administration of intravenous contrast. RADIATION DOSE REDUCTION: This exam was performed according to the departmental dose-optimization program which includes automated exposure control, adjustment of the mA and/or kV according to patient size and/or use of iterative reconstruction technique. CONTRAST:  58m OMNIPAQUE IOHEXOL 300 MG/ML   SOLN COMPARISON:  07/24/2021 and previous FINDINGS: Lower chest: Coronary calcifications. No pleural or pericardial effusion. Moderate hiatal hernia. Minimal dependent atelectasis in the lung bases. Hepatobiliary: No focal liver abnormality is seen. No gallstones, gallbladder wall thickening, or biliary dilatation. Pancreas: Unremarkable. No pancreatic ductal dilatation or surrounding inflammatory changes. Spleen: Normal in size without focal abnormality. Adrenals/Urinary Tract: Adrenal glands are unremarkable. Kidneys are normal, without renal calculi, focal lesion, or hydronephrosis. Bladder is unremarkable. Stomach/Bowel: Moderate hiatal hernia. The stomach is incompletely distended. Small bowel decompressed. Normal appendix. The colon is nondilated, with scattered descending and sigmoid diverticula without adjacent inflammatory change. Vascular/Lymphatic: Moderate calcified aortoiliac atheromatous plaque without aneurysm or evident stenosis. No abdominal or pelvic adenopathy. Reproductive: Status post hysterectomy. 4.2 cm right ovary, previously 4.6 on 07/08/2018. No adnexal masses. Other: No ascites.  No free air. Musculoskeletal: Orthopedic hardware across the right femoral neck. Chronic compression deformities of T11, T12, L1, and L2. Mild  lumbar levoscoliosis with multilevel spondylitic change. IMPRESSION: 1. No acute findings. 2. Coronary and Aortic Atherosclerosis (ICD10-170.0). 3. Colonic diverticulosis 4. Hiatal hernia 5. Stable thoracolumbar compression deformities Electronically Signed   By: Lucrezia Europe M.D.   On: 02/14/2022 08:52  ? ?DG Chest Portable 1 View ? ?Result Date: 02/14/2022 ?CLINICAL DATA:  Nausea, vomiting and abdominal pain. EXAM: PORTABLE CHEST 1 VIEW COMPARISON:  07/08/2008 FINDINGS: Stable top-normal heart size and atherosclerotic calcifications of the thoracic aorta. Low bilateral lung volumes. No overt edema or airspace consolidation. No pneumothorax or visualized pleural fluid.  Osteopenia and probable scoliosis of the thoracic spine. Underlying thoracic compression fractures suspected. IMPRESSION: 1. Bilateral lung volumes. 2. Osteopenia with probable thoracic compression fractures. Electron

## 2022-02-14 NOTE — Progress Notes (Signed)
Cross Cover ?Dose of zofran ordered for nausea ? ?

## 2022-02-14 NOTE — Progress Notes (Signed)
*  PRELIMINARY RESULTS* ?Echocardiogram ?2D Echocardiogram has been performed. ? ?Chikita Dogan, [place] ?02/14/2022, 11:49 AM ?

## 2022-02-14 NOTE — ED Notes (Signed)
This RN called lab at this time and confirmed they could run A1c on blood already down there.  ?

## 2022-02-14 NOTE — Assessment & Plan Note (Signed)
Stable ?Continue alprazolam, buspirone, Seroquel and sertraline ?

## 2022-02-14 NOTE — Consult Note (Addendum)
NEUROLOGY CONSULTATION NOTE  ? ?Date of service: February 14, 2022 ?Patient Name: Michele [name] ?MRN:  [id number] ?DOB:  04-Feb-1924 ?Reason for consult: stroke ?Requesting physician: Dr. Royce Macadamia Agbata ?_ _ _   _ __   _ __ _ _  __ __   _ __   __ _ ? ?History of Present Illness  ? ?88 yo woman with Alzheimer's dementia (requiring 24 hr care), CAD, stroke, hx aneurysm f/b [facility] until 2018 then lost to f/u presented from [facility] with vertigo and nausea. Ambulates with RW at baseline but was unable to do that today. LKW 1800 yesterday. She feels like room is moving when it's not and is nauseated but has not vomited. Oriented to self, family at bedside, and hospital (family states this is baseline). No focal neurologic deficits. ? ?MRI brain ?MRA H&N ? ?1. Early subacute cerebellar infarcts, left larger than right. ?2. 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 ?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 ?4. Intracranial atherosclerosis with multiple anterior and posterior ?circulation stenoses as detailed above. ?5. 6 mm right and 10 mm left supraclinoid ICA aneurysms. ?6. Severe left V3 stenosis. ?7. No cervical carotid artery stenosis. ? ?Stroke Labs ? ?No results found for: CHOL, TRIG, HDL, CHOLHDL, VLDL, LDLCALC, LDLDIRECT, LABVLDL ? ?Lab Results  ?Component Value Date/Time  ? HGBA1C 5.4 02/14/2022 07:19 AM  ? ? ? ?CNS imaging personally reviewed; I agree with above interpretations ?  ?ROS  ? ?UTA 2/2 baseline dementia ? ?Past History  ? ?I have reviewed the following: ? ?Past Medical History:  ?Diagnosis Date  ? LBBB (left bundle branch block)   ? MI (myocardial infarction) ([place])   ? Stroke [facility])   ? ?Past Surgical History:  ?Procedure Laterality Date  ? ABDOMINAL HYSTERECTOMY    ? APPENDECTOMY    ? HIP PINNING,CANNULATED Right 07/08/2018  ? Procedure: CANNULATED HIP PINNING;  Surgeon: Leim Fabry, MD;  Location: [facility] ORS;  Service: Orthopedics;  Laterality: Right;  ? ROTATOR CUFF REPAIR Left   ? ?History  reviewed. No pertinent family history. ?Social History  ? ?Socioeconomic History  ? Marital status: Widowed  ?  Spouse name: Not on file  ? Number of children: Not on file  ? Years of education: Not on file  ? Highest education level: Not on file  ?Occupational History  ? Not on file  ?Tobacco Use  ? Smoking status: Never  ? Smokeless tobacco: Never  ?Vaping Use  ? Vaping Use: Never used  ?Substance and Sexual Activity  ? Alcohol use: No  ? Drug use: Not on file  ? Sexual activity: Not on file  ?Other Topics Concern  ? Not on file  ?Social History Narrative  ? Not on file  ? ?Social Determinants of Health  ? ?Financial Resource Strain: Not on file  ?Food Insecurity: Not on file  ?Transportation Needs: Not on file  ?Physical Activity: Not on file  ?Stress: Not on file  ?Social Connections: Not on file  ? ?Allergies  ?Allergen Reactions  ? Penicillins Rash  ?  Has patient had a PCN reaction causing immediate rash, facial/tongue/throat swelling, SOB or lightheadedness with hypotension: Yes ?Has patient had a PCN reaction causing severe rash involving mucus membranes or skin necrosis: No ?Has patient had a PCN reaction that required hospitalization: No ?Has patient had a PCN reaction occurring within the last 10 years: No ?If all of the above answers are "NO", then may proceed with Cephalosporin  use.  ? ? ?Medications  ? ?Medications Prior to Admission  ?Medication Sig Dispense Refill Last Dose  ? ALPRAZolam (XANAX) 0.25 MG tablet Take 0.25 mg by mouth 2 (two) times daily as needed.   02/13/2022  ? amLODipine (NORVASC) 2.5 MG tablet Take 2.5 mg by mouth daily.   02/13/2022  ? aspirin 325 MG tablet Take 325 mg by mouth daily.   02/13/2022  ? busPIRone (BUSPAR) 10 MG tablet Take 10 mg by mouth 2 (two) times daily.   02/13/2022  ? HYDROcodone-acetaminophen (NORCO/VICODIN) 5-325 MG tablet Take 1 tablet by mouth 4 (four) times daily.   02/13/2022  ? metoprolol succinate (TOPROL-XL) 25 MG 24 hr tablet Take 1 tablet by mouth daily.    02/13/2022  ? QUEtiapine (SEROQUEL) 50 MG tablet Take 25 mg by mouth at bedtime.   02/13/2022  ? ramipril (ALTACE) 5 MG capsule Take 5 mg by mouth daily.   02/13/2022  ? sertraline (ZOLOFT) 25 MG tablet Take 25 mg by mouth at bedtime.   02/13/2022  ? spironolactone (ALDACTONE) 25 MG tablet Take 12.5 mg by mouth daily.   02/13/2022  ? ciprofloxacin (CIPRO) 500 MG tablet Take 250 mg by mouth 2 (two) times daily. (Patient not taking: Reported on 02/14/2022)   Completed Course  ? diclofenac sodium (VOLTAREN) 1 % GEL Apply 4 g topically 4 (four) times daily as needed (pain).   UNK  ? ofloxacin (FLOXIN) 0.3 % OTIC solution SMARTSIG:In Ear(s) (Patient not taking: Reported on 02/14/2022)   Completed Course  ? potassium chloride (KLOR-CON) 10 MEQ tablet Take 1 tablet by mouth daily. (Patient not taking: Reported on 02/14/2022)   Not Taking  ? sennosides-docusate sodium (SENOKOT-S) 8.6-50 MG tablet Take 2 tablets by mouth at bedtime. (Patient not taking: Reported on 02/14/2022)   Not Taking  ?  ? ? ?Current Facility-Administered Medications:  ?  acetaminophen (TYLENOL) tablet 650 mg, 650 mg, Oral, Q4H PRN **OR** acetaminophen (TYLENOL) 160 MG/5ML solution 650 mg, 650 mg, Per Tube, Q4H PRN **OR** acetaminophen (TYLENOL) suppository 650 mg, 650 mg, Rectal, Q4H PRN, Agbata, Tochukwu, MD ?  ALPRAZolam (XANAX) tablet 0.25 mg, 0.25 mg, Oral, BID PRN, Agbata, Tochukwu, MD ?  aspirin chewable tablet 81 mg, 81 mg, Oral, Daily, Derek Jack, MD, 81 mg at 02/14/22 1029 ?  busPIRone (BUSPAR) tablet 10 mg, 10 mg, Oral, BID, Agbata, Tochukwu, MD, 10 mg at 02/14/22 1251 ?  enoxaparin (LOVENOX) injection 30 mg, 30 mg, Subcutaneous, Q24H, Agbata, Tochukwu, MD ?  HYDROcodone-acetaminophen (NORCO/VICODIN) 5-325 MG per tablet 0.5 tablet, 0.5 tablet, Oral, QID, Agbata, Tochukwu, MD, 0.5 tablet at 02/14/22 1711 ?  QUEtiapine (SEROQUEL) tablet 25 mg, 25 mg, Oral, QHS, Agbata, Tochukwu, MD ?  sertraline (ZOLOFT) tablet 25 mg, 25 mg, Oral, QHS, Agbata,  Tochukwu, MD ? ?Vitals  ? ?Vitals:  ? 02/14/22 1100 02/14/22 1414 02/14/22 1516 02/14/22 1631  ?BP: (!) 152/63 (!) 148/70 (!) 145/69 (!) 157/65  ?Pulse: 76 (!) 107 70 67  ?Resp: '16 20 18 18  '$ ?Temp:  98.7 ?F (37.1 ?C) 98.1 ?F (36.7 ?C) 98.4 ?F (36.9 ?C)  ?TempSrc:  Oral    ?SpO2: 95% 95% 93% 97%  ?Weight:      ?Height:      ?  ? ?Body mass index is 22.26 kg/m?. ? ?Physical Exam  ? ?Physical Exam ?Gen: alert, oriented to self, family at bedside, and hospital ?HEENT: Atraumatic, normocephalic;mucous membranes moist; oropharynx clear, tongue without atrophy or fasciculations. ?Neck: Supple, trachea midline. ?Resp:  CTAB, no w/r/r ?CV: RRR, no m/g/r; nml S1 and S2. 2+ symmetric peripheral pulses. ?Abd: soft/NT/ND; nabs x 4 quad ?Extrem: Nml bulk; no cyanosis, clubbing, or edema. ? ?Neuro: ?*MS: alert, oriented to self, family at bedside, and hospital. Follows simple commands ?*Speech: fluid, nondysarthric, able to name and repeat ?*CN:  ?  I: Deferred ?  II,III: PERRLA, blinks to threat bilat, optic discs unable to be visualized 2/2 pupillary constriction ?  III,IV,VI: EOMI w/o nystagmus, no ptosis ?  V: Sensation intact from V1 to V3 to LT ?  VII: Eyelid closure was full.  Mild L facial droop ?  VIII: Hearing intact to voice ?  IX,X: Voice normal, palate elevates symmetrically  ?  XI: SCM/trap 5/5 bilat   ?XII: Tongue protrudes midline, no atrophy or fasciculations  ? ?*Motor:   Normal bulk.  No tremor, rigidity or bradykinesia. 4/5 strength diffusely in all extremities with drift but not to bed. ?*Sensory: SILT ?*Coordination:  dysmetria on FNF bilat without frank ataxia ?*Reflexes:  2+ and symmetric throughout without clonus; toes down-going bilat ?*Gait: deferred ? ?NIHSS ? ?1a Level of Conscious.: 0 ?1b LOC Questions: 1 ?1c LOC Commands: 0 ?2 Best Gaze: 0 ?3 Visual: 0 ?4 Facial Palsy: 1 ?5a Motor Arm - left: 1 ?5b Motor Arm - Right: 1 ?6a Motor Leg - Left: 1 ?6b Motor Leg - Right: 1 ?7 Limb Ataxia: 0 ?8 Sensory: 0 ?9  Best Language: 0 ?10 Dysarthria: 0 ?11 Extinct. and Inatten.: 0 ? ?TOTAL: 6 ? ? ?Premorbid mRS = 4 ? ? ?Labs  ? ?CBC:  ?Recent Labs  ?Lab 02/14/22 ?0719  ?WBC 7.3  ?HGB 10.4*  ?HCT 33.7*  ?MCV 89.9

## 2022-02-14 NOTE — ED Notes (Signed)
Lab called at this time to confirm they could run Troponin on samples already in lab ?

## 2022-02-14 NOTE — H&P (Signed)
?History and Physical  ? ? ?Patient: Michele [name] ZOX:[id number] DOB: 05/06/1924 ?DOA: 02/14/2022 ?DOS: the patient was seen and examined on 02/14/2022 ?PCP: Baxter Hire, MD  ?Patient coming from: Home ? ?Chief Complaint:  ?Chief Complaint  ?Patient presents with  ? Abdominal Pain  ? Emesis  ? ?HPI: Michele [name] is a 88 y.o. female with medical history significant for Alzheimer's dementia (she has 24-hour care), coronary artery disease and stroke who presents to the ER from [facility] where she resides for evaluation of feeling unwell which is described as weakness and nausea. ?At baseline patient ambulates with a rolling walker and is usually able to get out of bed with assistance and go to the bathroom but was unable to do that this morning.  She complains of nausea but has had no vomiting.  She also complains about pain in her right ear and has had extensive work-up for same. ?Her granddaughter who was at the bedside states that she saw her about 6 PM last night and that she had no complaints. ?She denies feeling dizzy or lightheaded, denies having any chest pain, no shortness of breath, no leg swelling, no headache, no difficulty swallowing or leg swelling. ? ?Review of Systems: As mentioned in the history of present illness. All other systems reviewed and are negative. ?Past Medical History:  ?Diagnosis Date  ? LBBB (left bundle branch block)   ? MI (myocardial infarction) ([place])   ? Stroke [facility])   ? ?Past Surgical History:  ?Procedure Laterality Date  ? ABDOMINAL HYSTERECTOMY    ? APPENDECTOMY    ? HIP PINNING,CANNULATED Right 07/08/2018  ? Procedure: CANNULATED HIP PINNING;  Surgeon: Leim Fabry, MD;  Location: [facility] ORS;  Service: Orthopedics;  Laterality: Right;  ? ROTATOR CUFF REPAIR Left   ? ?Social History:  reports that she has never smoked. She has never used smokeless tobacco. She reports that she does not drink alcohol. No history on file for drug use. ? ?Allergies  ?Allergen Reactions  ?  Penicillins Rash  ?  Has patient had a PCN reaction causing immediate rash, facial/tongue/throat swelling, SOB or lightheadedness with hypotension: Yes ?Has patient had a PCN reaction causing severe rash involving mucus membranes or skin necrosis: No ?Has patient had a PCN reaction that required hospitalization: No ?Has patient had a PCN reaction occurring within the last 10 years: No ?If all of the above answers are "NO", then may proceed with Cephalosporin use.  ? ? ?History reviewed. No pertinent family history. ? ?Prior to Admission medications   ?Medication Sig Start Date End Date Taking? Authorizing Provider  ?ALPRAZolam (XANAX) 0.25 MG tablet Take 0.25 mg by mouth 2 (two) times daily as needed. 05/23/20  Yes [provider]  ?amLODipine (NORVASC) 2.5 MG tablet Take 2.5 mg by mouth daily.   Yes [provider]  ?aspirin 325 MG tablet Take 325 mg by mouth daily.   Yes [provider]  ?busPIRone (BUSPAR) 10 MG tablet Take 10 mg by mouth 2 (two) times daily. 06/26/20  Yes [provider]  ?HYDROcodone-acetaminophen (NORCO/VICODIN) 5-325 MG tablet Take 1 tablet by mouth 4 (four) times daily. 08/29/18  Yes [provider]  ?metoprolol succinate (TOPROL-XL) 25 MG 24 hr tablet Take 1 tablet by mouth daily. 08/16/20  Yes [provider]  ?QUEtiapine (SEROQUEL) 50 MG tablet Take 25 mg by mouth at bedtime. 08/30/20  Yes [provider]  ?ramipril (ALTACE) 5 MG capsule Take 5 mg by mouth  daily. 07/18/20  Yes [provider]  ?sertraline (ZOLOFT) 25 MG tablet Take 25 mg by mouth at bedtime. 07/14/20  Yes [provider]  ?spironolactone (ALDACTONE) 25 MG tablet Take 12.5 mg by mouth daily. 01/01/21  Yes [provider]  ?ciprofloxacin (CIPRO) 500 MG tablet Take 250 mg by mouth 2 (two) times daily. ?Patient not taking: Reported on 02/14/2022 11/09/20   [provider]  ?diclofenac sodium (VOLTAREN) 1 % GEL Apply 4 g topically 4  (four) times daily as needed (pain).    [provider]  ?ofloxacin (FLOXIN) 0.3 % OTIC solution SMARTSIG:In Ear(s) ?Patient not taking: Reported on 02/14/2022 08/18/20   [provider]  ?potassium chloride (KLOR-CON) 10 MEQ tablet Take 1 tablet by mouth daily. ?Patient not taking: Reported on 02/14/2022 06/22/20   [provider]  ?sennosides-docusate sodium (SENOKOT-S) 8.6-50 MG tablet Take 2 tablets by mouth at bedtime. ?Patient not taking: Reported on 02/14/2022    [provider]  ? ? ?Physical Exam: ?Vitals:  ? 02/14/22 0845 02/14/22 0930 02/14/22 1000 02/14/22 1030  ?BP: (!) 148/52 (!) 140/92 (!) 151/61   ?Pulse: 65 71 78 75  ?Resp: '20 16  14  '$ ?Temp:      ?TempSrc:      ?SpO2: 95% 99% 93% 97%  ?Weight:      ?Height:      ? ?Physical Exam ?Vitals and nursing note reviewed.  ?Constitutional:   ?   Appearance: She is well-developed.  ?   Comments: Oriented to person and place.  ?HENT:  ?   Head: Normocephalic and atraumatic.  ?Cardiovascular:  ?   Rate and Rhythm: Normal rate and regular rhythm.  ?Pulmonary:  ?   Effort: Pulmonary effort is normal.  ?   Breath sounds: Normal breath sounds.  ?Abdominal:  ?   General: Abdomen is flat. Bowel sounds are normal.  ?   Palpations: Abdomen is soft.  ?Skin: ?   General: Skin is warm and dry.  ?Neurological:  ?   General: No focal deficit present.  ?   Mental Status: She is alert.  ?   Comments: Able to move all extremities.  Oriented to person and place  ?Psychiatric:     ?   Mood and Affect: Mood normal.     ?   Behavior: Behavior normal.  ? ? ? ?Data Reviewed: ?Relevant notes from primary care and specialist visits, past discharge summaries as available in EHR, including Care Everywhere. ?Prior diagnostic testing as pertinent to current admission diagnoses ?Updated medications and problem lists for reconciliation ?ED course, including vitals, labs, imaging, treatment and response to treatment ?Triage notes, nursing and pharmacy notes  and ED provider's notes ?Notable results as noted in HPI ?Labs reviewed.  Sodium 133, potassium 4.2, chloride 105, bicarb 23, glucose 140, BUN 16, creatinine 1.03, calcium 8.7, white count 7.3, hemoglobin 10.4, hematocrit 33.7, platelet count 299, lipase 33 ?Chest x-ray reviewed by me shows bilateral lung volumes. Osteopenia with probable thoracic compression fractures. ?CT scan of the head without contrast showed findings concerning for acute or subacute infarct in the inferior ?left cerebellum. Mild hyperdensity in this region may represent petechial hemorrhage. No mass occupying acute hemorrhage. Recommend MRI to further evaluate. Multiple remote infarcts, detailed above. ?CT scan of abdomen and pelvis shows no acute findings. Coronary and Aortic Atherosclerosis. ?Colonic diverticulosis. Hiatal hernia. Stable thoracolumbar compression deformities ?Twelve-lead EKG reviewed by me shows sinus rhythm with a left bundle branch block ([place]) ?There are no  new results to review at this time. ? ?Assessment and Plan: ?* CVA (cerebral vascular accident) ([place]) ?Patient with a prior history of CVA who presents to the ER for evaluation of weakness and nausea and is found to have findings concerning for acute or subacute infarct in the inferior ?left cerebellum. Mild hyperdensity in this region may represent ?petechial hemorrhage.  ?Awaiting results of MRI of the brain ?Place patient on aspirin 81 mg daily ?Start patient on atorvastatin ?Allow for permissive hypertension  ?We will request PT/OT/ST consult ?Obtain 2D echocardiogram to assess LVEF and rule out cardiac thrombus ?Consult neurology ? ?Essential hypertension, benign ?Blood pressure is stable ?Allow for permissive hypertension ? ?Rheumatoid arthritis, unspecified ([place]) ?Continue low-dose hydrocodone for pain control ? ?Depression ?Stable ?Continue alprazolam, buspirone, Seroquel and sertraline ? ? ? ? ? Advance Care Planning:   Code Status: DNR  ? ?Consults:  Neurology ? ?Family Communication: Greater than 50% of time was spent discussing plan of care with patient's granddaughter at the bedside.  All questions and concerns have been addressed.  She verbalizes understanding

## 2022-02-14 NOTE — Evaluation (Signed)
Physical Therapy Evaluation ?Patient Details ?Name: Michele [name] ?MRN: [id number] ?DOB: 31-May-1924 ?Today's Date: 02/14/2022 ? ?History of Present Illness ? 88 y.o. female with medical history significant for Alzheimer's dementia (she has 24-hour care), coronary artery disease and stroke who presents to the ER from [facility] where she resides for evaluation of feeling unwell which is described as weakness and nausea. ?  ?Clinical Impression ? Pt admitted with above diagnosis. Pt received sitting upright at EoB with OT at bedside. Confirmed PLOF and home living from OT and caregiver at bedside. Per OT pt lives at [facility] with 24/7 care and ambulating short distances with rollator at baseline. To date, pt able to stand with supervision and amb ~48' with minguard and minA on RW temporarily for directional changes and keeping body within BOS of RW for improved stability. Pt displaying normal UE/LE RAMPS, heel to shin, and finger to nose. Pt returned to supine with minA at LE's. Pt able to scoot up towards [facility] independently with all needs in reach. Pt close to baseline function with deficits in balance, safe use of DME, and muscle strength placing pt at risk of falls thus pt will continue to follow during acute admission to optimize/maintain current level of function. PT planning for no additional PT f/u at discharge. Pt currently with functional limitations due to the deficits listed below (see PT Problem List).     ?   ? ?Recommendations for follow up therapy are one component of a multi-disciplinary discharge planning process, led by the attending physician.  Recommendations may be updated based on patient status, additional functional criteria and insurance authorization. ? ?Follow Up Recommendations No PT follow up ? ?  ?Assistance Recommended at Discharge Frequent or constant Supervision/Assistance  ?Patient can return home with the following ? A little help with walking and/or transfers;A little help  with bathing/dressing/bathroom;Assist for transportation;Direct supervision/assist for medications management;Help with stairs or ramp for entrance ? ?  ?Equipment Recommendations None recommended by PT  ?Recommendations for Other Services ?    ?  ?Functional Status Assessment Patient has had a recent decline in their functional status and demonstrates the ability to make significant improvements in function in a reasonable and predictable amount of time.  ? ?  ?Precautions / Restrictions Precautions ?Precautions: Fall ?Restrictions ?Weight Bearing Restrictions: No  ? ?  ? ?Mobility ? Bed Mobility ?Overal bed mobility: Modified Independent, Needs Assistance ?Bed Mobility: Sit to Supine ?  ?  ?  ?Sit to supine: Min assist ?  ?General bed mobility comments: no physical assist to exit the bed. Pt needing cuing for technique. MinA at LE's to return to supine ?Patient Response: Cooperative ? ?Transfers ?Overall transfer level: Needs assistance ?Equipment used: Rolling walker (2 wheels) ?Transfers: Sit to/from Stand ?Sit to Stand: Supervision ?  ?  ?  ?  ?  ?General transfer comment: to RW ?  ? ?Ambulation/Gait ?Ambulation/Gait assistance: Min guard ?Gait Distance (Feet): 48 Feet ?Assistive device: Rolling walker (2 wheels) ?Gait Pattern/deviations: Step-to pattern, Drifts right/left, Trunk flexed ?  ?  ?  ?General Gait Details: Requiring minA for RW management and VC's for maintaining body closer to BOS of RW ? ?Stairs ?  ?  ?  ?  ?  ? ?Wheelchair Mobility ?  ? ?Modified Rankin (Stroke Patients Only) ?  ? ?  ? ?Balance Overall balance assessment: Needs assistance ?Sitting-balance support: Feet supported, Single extremity supported ?Sitting balance-Leahy Scale: Fair ?  ?  ?Standing balance support: During  functional activity, Reliant on assistive device for balance ?Standing balance-Leahy Scale: Fair ?  ?  ?  ?  ?  ?  ?  ?  ?  ?  ?  ?  ?   ? ? ? ?Pertinent Vitals/Pain Pain Assessment ?Pain Assessment: No/denies pain   ? ? ?Home Living Family/patient expects to be discharged to:: Assisted living ?  ?  ?  ?  ?  ?  ?  ?  ?Home Equipment: Cane - single Barista (2 wheels);Wheelchair - manual ?   ?  ?Prior Function Prior Level of Function : Needs assist ?  ?  ?  ?  ?  ?  ?Mobility Comments: Pt with 24/7 supervision overall for all functional mobility with RW ?ADLs Comments: Pt able to perform sink bath with supervision and showers with assistance. She can dress herself. ?  ? ? ?Hand Dominance  ? Dominant Hand: Right ? ?  ?Extremity/Trunk Assessment  ? Upper Extremity Assessment ?Upper Extremity Assessment: Overall WFL for tasks assessed ?  ? ?Lower Extremity Assessment ?Lower Extremity Assessment: Overall WFL for tasks assessed ?  ? ?   ?Communication  ? Communication: HOH  ?Cognition Arousal/Alertness: Awake/alert ?Behavior During Therapy: [facility] for tasks assessed/performed ?Overall Cognitive Status: History of cognitive impairments - at baseline ?  ?  ?  ?  ?  ?  ?  ?  ?  ?  ?  ?  ?  ?  ?  ?  ?General Comments: Pt is pleasant and cooperative ?  ?  ? ?  ?General Comments   ? ?  ?Exercises Other Exercises ?Other Exercises: Role of PT in acute setting, safe use of DME  ? ?Assessment/Plan  ?  ?PT Assessment Patient needs continued PT services  ?PT Problem List Decreased strength;Decreased mobility;Decreased safety awareness;Decreased cognition;Decreased balance ? ?   ?  ?PT Treatment Interventions DME instruction;Therapeutic exercise;Balance training;Gait training;Neuromuscular re-education;Functional mobility training;Therapeutic activities;Patient/family education   ? ?PT Goals (Current goals can be found in the Care Plan section)  ?Acute Rehab PT Goals ?PT Goal Formulation: Patient unable to participate in goal setting ? ?  ?Frequency Min 2X/week ?  ? ? ?Co-evaluation   ?  ?  ?  ?  ? ? ?  ?AM-PAC PT "6 Clicks" Mobility  ?Outcome Measure Help needed turning from your back to your side while in a flat bed without using  bedrails?: None ?Help needed moving from lying on your back to sitting on the side of a flat bed without using bedrails?: A Little ?Help needed moving to and from a bed to a chair (including a wheelchair)?: A Little ?Help needed standing up from a chair using your arms (e.g., wheelchair or bedside chair)?: A Little ?Help needed to walk in hospital room?: A Little ?Help needed climbing 3-5 steps with a railing? : A Lot ?6 Click Score: 18 ? ?  ?End of Session Equipment Utilized During Treatment: Gait belt ?Activity Tolerance: Patient tolerated treatment well ?Patient left: in bed;with family/visitor present ?Nurse Communication: Mobility status ?PT Visit Diagnosis: Unsteadiness on feet (R26.81);Other abnormalities of gait and mobility (R26.89);Muscle weakness (generalized) (M62.81) ?  ? ?Time: 4259-5638 ?PT Time Calculation (min) (ACUTE ONLY): 19 min ? ? ?Charges:   PT Evaluation ?$PT Eval Moderate Complexity: 1 Mod ?  ?  ?   ? ?[name]. Fairly IV, PT, DPT ?Physical Therapist- [place]  ?[facility]  ?02/14/2022, 3:40 PM ? ?

## 2022-02-14 NOTE — ED Notes (Signed)
Pt taken to CT at this time.

## 2022-02-14 NOTE — Evaluation (Signed)
Occupational Therapy Evaluation ?Patient Details ?Name: Michele [name] ?MRN: [id number] ?DOB: [birth year] ?Today's Date: 02/14/2022 ? ? ?History of Present Illness 88 y.o. female with medical history significant for Alzheimer's dementia (she has 24-hour care), coronary artery disease and stroke who presents to the ER from [facility] where she resides for evaluation of feeling unwell which is described as weakness and nausea.  ? ?Clinical Impression ?  ?Patient presenting with decreased Ind in self care, balance, functional mobility/transfers, endurance, and safety awareness. Patient's caregiver present to confirm baseline. Pt lives at [facility] and has 24/7 caregivers at baseline. She is able to ambulate with RW short distances and can perform toileting and bathing from sink without physical assistance. Caregivers do assist for energy conservation or if pt is getting into shower. Caregiver present during assessment reports pt is close to baseline at this time. Pt performing ambulation and toileting needs with RW and min guard. Patient will benefit from acute OT to increase overall independence in the areas of ADLs, functional mobility,and safety awareness in order to safely discharge home with assist.  ?   ? ?Recommendations for follow up therapy are one component of a multi-disciplinary discharge planning process, led by the attending physician.  Recommendations may be updated based on patient status, additional functional criteria and insurance authorization.  ? ?Follow Up Recommendations ? No OT follow up  ?  ?Assistance Recommended at Discharge Intermittent Supervision/Assistance  ?Patient can return home with the following A little help with walking and/or transfers;A little help with bathing/dressing/bathroom;Direct supervision/assist for medications management;Direct supervision/assist for financial management;Assistance with cooking/housework;Assist for transportation ? ?  ?Functional  Status Assessment ? Patient has had a recent decline in their functional status and demonstrates the ability to make significant improvements in function in a reasonable and predictable amount of time.  ?Equipment Recommendations ? None recommended by OT  ?  ?   ?Precautions / Restrictions Precautions ?Precautions: Fall  ? ?  ? ?Mobility Bed Mobility ?Overal bed mobility: Modified Independent ?  ?  ?  ?  ?  ?  ?General bed mobility comments: no physical assist to exit the bed. Pt needing cuing for technique ?  ? ?Transfers ?Overall transfer level: Needs assistance ?Equipment used: Rolling walker (2 wheels) ?  ?  ?  ?  ?  ?  ?  ?  ?  ? ?  ?Balance Overall balance assessment: Needs assistance ?Sitting-balance support: Feet supported, Single extremity supported ?Sitting balance-Leahy Scale: Fair ?  ?  ?Standing balance support: During functional activity, Reliant on assistive device for balance ?Standing balance-Leahy Scale: Fair ?  ?  ?  ?  ?  ?  ?  ?  ?  ?  ?  ?  ?   ? ?ADL either performed or assessed with clinical judgement  ? ?ADL Overall ADL's : Needs assistance/impaired ?  ?  ?Grooming: Wash/dry hands;Standing;Supervision/safety ?  ?  ?  ?  ?  ?  ?  ?  ?  ?Toilet Transfer: Rolling walker (2 wheels);Min guard ?  ?Toileting- Water quality scientist and Hygiene: Min guard;Sit to/from stand ?  ?  ?  ?Functional mobility during ADLs: Min guard;Supervision/safety;Rolling walker (2 wheels) ?   ? ? ? ?Vision Patient Visual Report: No change from baseline ?   ?   ?   ?   ? ?Pertinent Vitals/Pain Pain Assessment ?Pain Assessment: No/denies pain  ? ? ? ?Hand Dominance Right ?  ?Extremity/Trunk Assessment Upper Extremity Assessment ?Upper  Extremity Assessment: Overall WFL for tasks assessed ?  ?Lower Extremity Assessment ?Lower Extremity Assessment: Overall WFL for tasks assessed ?  ?  ?  ?Communication Communication ?Communication: HOH ?  ?Cognition Arousal/Alertness: Awake/alert ?Behavior During Therapy: [facility] for tasks  assessed/performed ?Overall Cognitive Status: History of cognitive impairments - at baseline ?  ?  ?  ?  ?  ?  ?  ?  ?  ?  ?  ?  ?  ?  ?  ?  ?General Comments: Pt is pleasant and cooperative ?  ?  ?   ?   ?   ? ? ?Home Living Family/patient expects to be discharged to:: Assisted living ?  ?  ?  ?  ?  ?  ?  ?  ?  ?  ?  ?  ?  ?  ?Home Equipment: Cane - single Barista (2 wheels);Wheelchair - manual ?  ?  ?  ? ?  ?Prior Functioning/Environment Prior Level of Function : Needs assist ?  ?  ?  ?  ?  ?  ?Mobility Comments: Pt with 24/7 supervision overall for all functional mobility with RW ?ADLs Comments: Pt able to perform sink bath with supervision and showers with assistance. She can dress herself. ?  ? ?  ?  ?OT Problem List: Decreased strength;Decreased activity tolerance;Impaired balance (sitting and/or standing);Decreased safety awareness ?  ?   ?OT Treatment/Interventions: Self-care/ADL training;Therapeutic exercise;Manual therapy;Patient/family education;Balance training;Energy conservation;Therapeutic activities;DME and/or AE instruction;Cognitive remediation/compensation  ?  ?OT Goals(Current goals can be found in the care plan section) Acute Rehab OT Goals ?Patient Stated Goal: to go home ?OT Goal Formulation: With patient ?Time For Goal Achievement: 02/28/22 ?Potential to Achieve Goals: Good ?ADL Goals ?Pt Will Perform Grooming: with modified independence;standing ?Pt Will Perform Lower Body Dressing: with supervision;sit to/from stand ?Pt Will Transfer to Toilet: with supervision;ambulating ?Pt Will Perform Toileting - Clothing Manipulation and hygiene: with supervision;sit to/from stand  ?OT Frequency: Min 2X/week ?  ? ?   ?AM-PAC OT "6 Clicks" Daily Activity     ?Outcome Measure Help from another person eating meals?: None ?Help from another person taking care of personal grooming?: A Little ?Help from another person toileting, which includes using toliet, bedpan, or urinal?: A Little ?Help  from another person bathing (including washing, rinsing, drying)?: A Little ?Help from another person to put on and taking off regular upper body clothing?: None ?Help from another person to put on and taking off regular lower body clothing?: A Little ?6 Click Score: 20 ?  ?End of Session Equipment Utilized During Treatment: Rolling walker (2 wheels) ?Nurse Communication: Mobility status ? ?Activity Tolerance: Patient tolerated treatment well ?Patient left: in bed;with call bell/phone within reach;Other (comment) (PT present) ? ?OT Visit Diagnosis: Unsteadiness on feet (R26.81);Muscle weakness (generalized) (M62.81)  ?              ?Time: 8280-0349 ?OT Time Calculation (min): 22 min ?Charges:  OT Evaluation ?$OT Eval Moderate Complexity: 1 Mod ? ?Darleen Crocker, [place], OTR/L , [place] ?ascom [phone]  ?02/14/22, 3:06 PM  ? ?

## 2022-02-14 NOTE — Assessment & Plan Note (Signed)
Continue low-dose hydrocodone for pain control ?

## 2022-02-15 DIAGNOSIS — I639 Cerebral infarction, unspecified: Secondary | ICD-10-CM | POA: Diagnosis not present

## 2022-02-15 LAB — LIPID PANEL
Cholesterol: 158 mg/dL (ref 0–200)
HDL: 54 mg/dL (ref >40)
LDL Cholesterol: 95 mg/dL (ref 0–99)
Total CHOL/HDL Ratio: 2.9 RATIO
Triglycerides: 47 mg/dL (ref <150)
VLDL: 9 mg/dL (ref 0–40)

## 2022-02-15 MED ORDER — CLOPIDOGREL BISULFATE 75 MG PO TABS: 75.0000 mg | ORAL_TABLET | Freq: Every day | ORAL | Status: DC

## 2022-02-15 MED ORDER — ASPIRIN 81 MG PO CHEW: 81.0000 mg | CHEWABLE_TABLET | Freq: Every day | ORAL | 0 refills | Status: DC

## 2022-02-15 MED ORDER — CLOPIDOGREL BISULFATE 75 MG PO TABS: 75.0000 mg | ORAL_TABLET | Freq: Every day | ORAL | 1 refills | Status: DC

## 2022-02-15 MED ORDER — ASPIRIN 81 MG PO CHEW: 81.0000 mg | CHEWABLE_TABLET | Freq: Every day | ORAL | Status: DC

## 2022-02-15 NOTE — Progress Notes (Signed)
PT Cancellation Note ? ?Patient Details ?Name: Michele [name] ?MRN: [id number] ?DOB: [birth year] ? ? ?Cancelled Treatment:    Reason Eval/Treat Not Completed: Patient declined, no reason specified. Pt received sleeping in recliner. Easily awakes to name. Politely declining PT as she is awaiting discharge to home environment stating she has been active today walking to <> from bathroom. ? ? ?[name]. Fairly IV, PT, DPT ?Physical Therapist- [place]  ?[facility]  ?02/15/2022, 3:53 PM ?

## 2022-02-15 NOTE — Progress Notes (Signed)
Occupational Therapy Treatment ?Patient Details ?Name: Michele [name] ?MRN: [id number] ?DOB: [birth year] ?Today's Date: 02/15/2022 ? ? ?History of present illness 88 y.o. female with medical history significant for Alzheimer's dementia (she has 24-hour care), coronary artery disease and stroke who presents to the ER from [facility] where she resides for evaluation of feeling unwell which is described as weakness and nausea. ?  ?OT comments ? Chart reviewed to date, RN cleared pt for participation in OT tx session. Tx session targeted progressing functional mobility, ADL task completion to facilitate return to baseline/PLOF. Pt required supervision for all ADL mobility on this date with RW, intermittent vcs required for RW placement. Grooming tasks  completed in standing with supervision. Good progress towards goal acquisition. Discharge recommendation remains appropriate. Pt is left in bedside chair, NAD, all needs met. OT will continue to follow.   ? ?Recommendations for follow up therapy are one component of a multi-disciplinary discharge planning process, led by the attending physician.  Recommendations may be updated based on patient status, additional functional criteria and insurance authorization. ?   ?Follow Up Recommendations ? No OT follow up  ?  ?Assistance Recommended at Discharge Intermittent Supervision/Assistance  ?Patient can return home with the following ? A little help with walking and/or transfers;A little help with bathing/dressing/bathroom;Direct supervision/assist for medications management;Direct supervision/assist for financial management;Assistance with cooking/housework;Assist for transportation ?  ?Equipment Recommendations ? None recommended by OT  ?  ?Recommendations for Other Services   ? ?  ?Precautions / Restrictions Precautions ?Precautions: Fall ?Restrictions ?Weight Bearing Restrictions: No  ? ? ?  ? ?Mobility Bed Mobility ?Overal bed mobility: Modified Independent, Needs  Assistance ?  ?  ?  ?  ?  ?  ?General bed mobility comments: NT pt recieved in chair ?  ? ?Transfers ?Overall transfer level: Needs assistance ?Equipment used: Rolling walker (2 wheels) ?Transfers: Sit to/from Stand ?Sit to Stand: Supervision, Min guard ?  ?  ?  ?  ?  ?General transfer comment: from chair, CGA from toilet due to low height ?  ?  ?Balance Overall balance assessment: Needs assistance ?Sitting-balance support: Feet supported, Single extremity supported ?Sitting balance-Leahy Scale: Good ?  ?  ?Standing balance support: During functional activity, Reliant on assistive device for balance ?Standing balance-Leahy Scale: Fair ?  ?  ?  ?  ?  ?  ?  ?  ?  ?  ?  ?  ?   ? ?ADL either performed or assessed with clinical judgement  ? ?ADL Overall ADL's : Needs assistance/impaired ?  ?  ?Grooming: Wash/dry hands;Standing;Supervision/safety ?Grooming Details (indicate cue type and reason): sink level ?  ?  ?  ?  ?  ?  ?  ?  ?Toilet Transfer: Rolling walker (2 wheels);Min guard ?  ?Toileting- Water quality scientist and Hygiene: Min guard;Sit to/from stand ?  ?  ?  ?Functional mobility during ADLs: Supervision/safety;Rolling walker (2 wheels) ?  ?  ? ?Extremity/Trunk Assessment   ?  ?  ?  ?  ?  ? ?Vision   ?  ?  ?Perception   ?  ?Praxis   ?  ? ?Cognition Arousal/Alertness: Awake/alert ?Behavior During Therapy: [facility] for tasks assessed/performed ?Overall Cognitive Status: History of cognitive impairments - at baseline ?  ?  ?  ?  ?  ?  ?  ?  ?  ?  ?  ?  ?  ?  ?  ?  ?  ?  ?  ?   ?  Exercises   ? ?  ?Shoulder Instructions   ? ? ?  ?General Comments L arm with scratch/dry skin, RN  notified  ? ? ?Pertinent Vitals/ Pain       Pain Assessment ?Pain Assessment: No/denies pain ? ?Home Living   ?  ?  ?  ?  ?  ?  ?  ?  ?  ?  ?  ?  ?  ?  ?  ?  ?  ?  ? ?  ?Prior Functioning/Environment    ?  ?  ?  ?   ? ?Frequency ? Min 2X/week  ? ? ? ? ?  ?Progress Toward Goals ? ?OT Goals(current goals can now be found in the care plan section) ?  Progress towards OT goals: Progressing toward goals ? ?Acute Rehab OT Goals ?Patient Stated Goal: go home ?OT Goal Formulation: With patient ?Time For Goal Achievement: 03/01/22 ?Potential to Achieve Goals: Good  ?Plan Discharge plan remains appropriate   ? ?Co-evaluation ? ? ?   ?  ?  ?  ?  ? ?  ?AM-PAC OT "6 Clicks" Daily Activity     ?Outcome Measure ? ? Help from another person eating meals?: None ?Help from another person taking care of personal grooming?: A Little ?Help from another person toileting, which includes using toliet, bedpan, or urinal?: A Little ?Help from another person bathing (including washing, rinsing, drying)?: A Little ?Help from another person to put on and taking off regular upper body clothing?: None ?Help from another person to put on and taking off regular lower body clothing?: A Little ?6 Click Score: 20 ? ?  ?End of Session Equipment Utilized During Treatment: Rolling walker (2 wheels) ? ?OT Visit Diagnosis: Unsteadiness on feet (R26.81);Muscle weakness (generalized) (M62.81) ?  ?Activity Tolerance Patient tolerated treatment well ?  ?Patient Left in chair;with call bell/phone within reach;with chair alarm set ?  ?Nurse Communication Mobility status;Other (comment) ?  ? ?   ? ?Time: 7510-2585 ?OT Time Calculation (min): 19 min ? ?Charges: OT General Charges ?$OT Visit: 1 Visit ?OT Treatments ?$Self Care/Home Management : 8-22 mins ? ?Shanon Payor, OTD OTR/L  ?02/15/22, 11:57 AM  ?

## 2022-02-15 NOTE — Discharge Summary (Signed)
?Physician Discharge Summary ?  ?Patient: Michele [name] MRN: [id number] DOB: 02/26/1924  ?Admit date:     02/14/2022  ?Discharge date: 02/15/22  ?Discharge Physician: Lorella Nimrod  ? ?PCP: Baxter Hire, MD  ? ?Recommendations at discharge:  ?Patient can start antihypertensive medications from tomorrow. ?We stopped full dose aspirin and started her on baby aspirin with Plavix which she will take together for 25-month followed by Plavix only. ?She is also being provided with ZIO monitor-please arrange follow-up with cardiology. ?Follow-up with primary care provider within a week ?Follow-up with outpatient neurology in 2 to 4 weeks ? ?Discharge Diagnoses: ?Principal Problem: ?  [place] ?Active Problems: ?  Essential hypertension, benign ?  Rheumatoid arthritis, unspecified ([place] ?  Depression ? ? ?Hospital Course: ?Taken from H&P and prior notes. ? ?[name] a 88y.o. female with medical history significant for Alzheimer's dementia (she has 24-hour care), coronary artery disease and stroke who presents to the ER from [facility] where she resides for evaluation of feeling unwell which is described as dizziness and nausea. ?At baseline patient ambulates with a rolling walker and is usually able to get out of bed with assistance and go to the bathroom but was unable to do that this morning.  She complains of nausea but has had no vomiting.  She also complains about pain in her right ear and has had extensive work-up for same. ?Her granddaughter who was at the bedside states that she saw her about 6 PM last night and that she had no complaints. ? ?On arrival she was hemodynamically stable, labs pertinent for sodium of 133, glucose of 140, BUN 16, creatinine 1.03 which is at her baseline, hemoglobin of 10.4. ?A1c of 5.4 ?Lipid profile within normal limits with LDL of 95 ? ?Chest x-ray with low bilateral lung volumes.  Osteopenia with probable thoracic compression  fractures. ? ?CT head without contrast concerning for acute or subacute infarct in the inferior left cerebellum along with multiple remote infarcts.  There was also a mild hyperdensity noted in this region which can present petechial hemorrhage, no mass occupying acute hemorrhage. ? ?MRI brain with moderate size infarct inferior medially in the left cerebellar hemisphere which most likely early subacute.  There was an another similar subcentimeter infarct medially in the right cerebellar hemisphere.  Multiple small chronic infarcts are noted in the right cerebellar hemisphere there is also a large chronic right PCA infarct with small amount of chronic blood products. ?Other hypodensities compatible with mild chronic small vessel ischemic disease.  Moderate cerebral atrophy and a partially empty sella is incidentally noted. ? ?MRA brain shows as follows ? ?1. Early subacute cerebellar infarcts, left larger than right. ?2. 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 ?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 ?4. Intracranial atherosclerosis with multiple anterior and posterior ?circulation stenoses as detailed above. ?5. 6 mm right and 10 mm left supraclinoid ICA aneurysms. ?6. Severe left V3 stenosis. ?7. No cervical carotid artery stenosis. ? ?Echocardiogram with normal low EF, indeterminate diastolic function.  Right ventricular systolic function was not determined as it was poorly visualized.  Mild to moderate aortic stenosis and bubble studies were negative. ? ?No specific recommendations by our PT and OT staff. ? ?Neurology was consulted and they are recommending DAPT with aspirin and Plavix for 90 days followed by Plavix only. ?Holding antihypertensives for permissive hypertension for 48 hours, can be resumed from tomorrow. ? ?Neurology also  recommended placing ZIO monitor for 2 weeks to rule out any A-fib.  Monitor will be placed before discharge and patient will follow-up with her cardiologist for  the results. ? ?She will continue current medications and follow-up with her providers. ? ?Assessment and Plan: ?* CVA (cerebral vascular accident) ([place]) ?Patient with a prior history of CVA who presents to the ER for evaluation of weakness and nausea and is found to have findings concerning for acute or subacute infarct in the inferior ?left cerebellum. Mild hyperdensity in this region may represent ?petechial hemorrhage.  ?Awaiting results of MRI of the brain ?Place patient on aspirin 81 mg daily ?Start patient on atorvastatin ?Allow for permissive hypertension  ?We will request PT/OT/ST consult ?Obtain 2D echocardiogram to assess LVEF and rule out cardiac thrombus ?Consult neurology ? ?Essential hypertension, benign ?Blood pressure is stable ?Allow for permissive hypertension ? ?Rheumatoid arthritis, unspecified ([place]) ?Continue low-dose hydrocodone for pain control ? ?Depression ?Stable ?Continue alprazolam, buspirone, Seroquel and sertraline ? ? ?Consultants: Neurology ?Procedures performed: None ?Disposition: Skilled nursing facility ?Diet recommendation:  ?Discharge Diet Orders (From admission, onward)  ? ?  Start     Ordered  ? 02/15/22 0000  Diet - low sodium heart healthy       ? 02/15/22 1226  ? ?  ?  ? ?  ? ?Cardiac diet ?DISCHARGE MEDICATION: ?Allergies as of 02/15/2022   ? ?   Reactions  ? Penicillins Rash  ? Has patient had a PCN reaction causing immediate rash, facial/tongue/throat swelling, SOB or lightheadedness with hypotension: Yes ?Has patient had a PCN reaction causing severe rash involving mucus membranes or skin necrosis: No ?Has patient had a PCN reaction that required hospitalization: No ?Has patient had a PCN reaction occurring within the last 10 years: No ?If all of the above answers are "NO", then may proceed with Cephalosporin use.  ? ?  ? ?  ?Medication List  ?  ? ?STOP taking these medications   ? ?aspirin 325 MG tablet ?Replaced by: aspirin 81 MG chewable tablet ?  ?ciprofloxacin 500  MG tablet ?Commonly known as: CIPRO ?  ?ofloxacin 0.3 % OTIC solution ?Commonly known as: FLOXIN ?  ?potassium chloride 10 MEQ tablet ?Commonly known as: KLOR-CON ?  ?sennosides-docusate sodium 8.6-50 MG tablet ?Commonly known as: SENOKOT-S ?  ? ?  ? ?TAKE these medications   ? ?ALPRAZolam 0.25 MG tablet ?Commonly known as: Duanne Moron ?Take 0.25 mg by mouth 2 (two) times daily as needed. ?  ?amLODipine 2.5 MG tablet ?Commonly known as: NORVASC ?Take 2.5 mg by mouth daily. ?  ?aspirin 81 MG chewable tablet ?Chew 1 tablet (81 mg total) by mouth daily. ?Replaces: aspirin 325 MG tablet ?  ?busPIRone 10 MG tablet ?Commonly known as: BUSPAR ?Take 10 mg by mouth 2 (two) times daily. ?  ?clopidogrel 75 MG tablet ?Commonly known as: PLAVIX ?Take 1 tablet (75 mg total) by mouth daily. ?  ?diclofenac sodium 1 % Gel ?Commonly known as: VOLTAREN ?Apply 4 g topically 4 (four) times daily as needed (pain). ?  ?HYDROcodone-acetaminophen 5-325 MG tablet ?Commonly known as: NORCO/VICODIN ?Take 1 tablet by mouth 4 (four) times daily. ?  ?metoprolol succinate 25 MG 24 hr tablet ?Commonly known as: TOPROL-XL ?Take 1 tablet by mouth daily. ?  ?QUEtiapine 50 MG tablet ?Commonly known as: SEROQUEL ?Take 25 mg by mouth at bedtime. ?  ?ramipril 5 MG capsule ?Commonly known as: ALTACE ?Take 5 mg by mouth daily. ?  ?sertraline 25 MG tablet ?  Commonly known as: ZOLOFT ?Take 25 mg by mouth at bedtime. ?  ?spironolactone 25 MG tablet ?Commonly known as: ALDACTONE ?Take 12.5 mg by mouth daily. ?  ? ?  ? ? Follow-up Information   ? ? Baxter Hire, MD. Schedule an appointment as soon as possible for a visit in 1 week(s).   ?Specialty: Internal Medicine ?Contact information: ?[address] ?[place] [address] ?[phone] ? ? ?  ?  ? ? Corey Skains, MD. Schedule an appointment as soon as possible for a visit.   ?Specialty: Cardiology ?Why: Patient need ZIO monitor for 2 weeks ?Contact information: ?[address] ?[facility] [place] ?[place] [address] ?[phone] ? ? ?  ?  ? ?  ?  ? ?  ? ?Discharge Exam: ?Filed Weights  ? 02/14/22 0716  ?Weight: 55.2 kg  ? ?General.  Frail elderly lady, in no acute distress. ?Pulmonary.  Laurena Spies

## 2022-02-15 NOTE — Progress Notes (Addendum)
SLP Cancellation Note ? ?Patient Details ?Name: Michele [name] ?MRN: [id number] ?DOB: 1924/03/17 ? ? ?Cancelled treatment:       Reason Eval/Treat Not Completed: SLP screened, no needs identified, will sign off (chart reviewed; met w/ pt in room w/ Caregiver present. NSG arrived too.) ?Pt  has a Baseline of Dementia; 24/7 Caregivers in the home. Caregiver present this morning who stated pt seemed at her baseline re: her communication and her swallowing. Both pt and Caregiver denied any difficulty swallowing this morning/at home; she is currently on a regular diet and consumed >75% of her breakfast meal. She tolerated swallowing pills w/ applesauce w/ NSG; Caregiver stated she uses applesauce at home at times. Pt conversed in general conversation w/out overt expressive/receptive deficits noted; Caregiver stated she was communicating w/ her at her Baseline w/ no speech-language deficits. Pt was oriented to self, Caregiver, and place/year. Speech intelligible. Pt is not wearing partial/denture(at home). She is able to manage w/ soft foods per Caregiver/pt report.  ? ?No further skilled ST services indicated as pt appears at her baseline. Discussed general aspiration precautions including using applesauce for pill swallowing to be conservative. Caregiver/pt agreed. Menu provided. NSG to reconsult if any change in status while admitted.   ? ? ? ? ? ?Orinda Kenner, MS, CCC-SLP ?Speech Language Pathologist ?Rehab Services; [place] ?[phone] (ascom) ?Ashley Montminy ?02/15/2022, 9:57 AM ?

## 2022-02-15 NOTE — Hospital Course (Addendum)
Taken from H&P and prior notes. ? ?Michele [name] is a 88 y.o. female with medical history significant for Alzheimer's dementia (she has 24-hour care), coronary artery disease and stroke who presents to the ER [facility] for evaluation of feeling unwell which is described as dizziness and nausea. ?At baseline patient ambulates with a rolling walker and is usually able to get out of bed with assistance and go to the bathroom but was unable to do that this morning.  She complains of nausea but has had no vomiting.  She also complains about pain in her right ear and has had extensive work-up for same. ?Her granddaughter who was at the bedside states that she saw her about 6 PM last night and that she had no complaints. ? ?On arrival she was hemodynamically stable, labs pertinent for sodium of 133, glucose of 140, BUN 16, creatinine 1.03 which is at her baseline, hemoglobin of 10.4. ?A1c of 5.4 ?Lipid profile within normal limits with LDL of 95 ? ?Chest x-ray with low bilateral lung volumes.  Osteopenia with probable thoracic compression fractures. ? ?CT head without contrast concerning for acute or subacute infarct in the inferior left cerebellum along with multiple remote infarcts.  There was also a mild hyperdensity noted in this region which can present petechial hemorrhage, no mass occupying acute hemorrhage. ? ?MRI brain with moderate size infarct inferior medially in the left cerebellar hemisphere which most likely early subacute.  There was an another similar subcentimeter infarct medially in the right cerebellar hemisphere.  Multiple small chronic infarcts are noted in the right cerebellar hemisphere there is also a large chronic right PCA infarct with small amount of chronic blood products. ?Other hypodensities compatible with mild chronic small vessel ischemic disease.  Moderate cerebral atrophy and a partially empty sella is incidentally noted. ? ?MRA brain shows as  follows ? ?1. Early subacute cerebellar infarcts, left larger than right. ?2. 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 ?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 ?4. Intracranial atherosclerosis with multiple anterior and posterior ?circulation stenoses as detailed above. ?5. 6 mm right and 10 mm left supraclinoid ICA aneurysms. ?6. Severe left V3 stenosis. ?7. No cervical carotid artery stenosis. ? ?Echocardiogram with normal low EF, indeterminate diastolic function.  Right ventricular systolic function was not determined as it was poorly visualized.  Mild to moderate aortic stenosis and bubble studies were negative. ? ?No specific recommendations by our PT and OT staff. ? ?Neurology was consulted and they are recommending DAPT with aspirin and Plavix for 90 days followed by Plavix only. ?Holding antihypertensives for permissive hypertension for 48 hours, can be resumed from tomorrow. ? ?Neurology also recommended placing ZIO monitor for 2 weeks to rule out any A-fib.  Monitor will be placed before discharge and patient will follow-up with her cardiologist for the results. ? ?She will continue current medications and follow-up with her providers. ?

## 2022-02-15 NOTE — Plan of Care (Signed)
Neurology plan of care ? ?TTE - no intracardiac clot, mild-mod AS ? ?- Permissive HTN x48 hrs from sx onset goal BP <180/105. PRN labetalol or hydralazine if BP above these parameters. Avoid oral antihypertensives. After 48 hrs, goal normotension, avoid hypotension.  ?- Check A1c and LDL + add statin per guidelines ?- ASA '81mg'$  daily + plavix '75mg'$  daily x90 days f/b plavix '75mg'$  daily after that given severe intracranial stenosis ?- q4 hr neuro checks ?- STAT head CT for any change in neuro exam ?- Tele ?- PT/OT/SLP ?- Stroke education ?- I will arrange outpatient neuro f/u ?- Recommend contacting cardiology to arrange outpatient cardiac monitor ? ?I discussed her aneurysm findings with Dr. Estanislado Pandy from neuro-IR and we agree that given her age and poor baseline functional status that she would not be a good candidate for tx of her unruptured aneurysms. ?  ?Stroke workup completed, neuro to sign off but please re-engage if additional questions arise. ? ?Su Monks, MD ?[facility] ?[phone] ? ?If 7pm- 7am, please page neurology on call as listed in [place]. ? ?

## 2022-02-15 NOTE — TOC Initial Note (Signed)
Transition of Care (TOC) - Initial/Assessment Note  ? ? ?Patient Details  ?Name: Michele [name] ?MRN: [id number] ?Date of Birth: [birth year] ? ?Transition of Care (TOC) CM/SW Contact:    ?Pete Pelt, RN ?Phone Number: ?02/15/2022, 1:25 PM ? ?Clinical Narrative:   Patient is from [place], caregiver is at bedside and attends to patient at home.  No follow up for PT OT at this time.  Patient and caregiver state that patient has all needed DME.  Caregiver will transport patient home.              ? ? ?Expected Discharge Plan:  ([place]) ?  ? ? ?Patient Goals and CMS Choice ?  ?  ?  ? ?Expected Discharge Plan and Services ?Expected Discharge Plan:  ([place]) ?  ?  ?  ?Living arrangements for the past 2 months: [place] ?Expected Discharge Date: 02/15/22               ?  ?  ?  ?  ?  ?  ?  ?  ?  ?  ? ?Prior Living Arrangements/Services ?Living arrangements for the past 2 months: [place] ?Lives with:: Self, Other (Comment) (Patient has caregivers at bedside and at home) ?Patient language and need for interpreter reviewed:: Yes (no interpreter required) ?       ?Need for Family Participation in Patient Care: Yes (Comment) ?Care giver support system in place?: Yes (comment) ?  ?Criminal Activity/Legal Involvement Pertinent to Current Situation/Hospitalization: No - Comment as needed ? ?Activities of Daily Living ?Home Assistive Devices/Equipment: Gilford Rile (specify type) ?ADL Screening (condition at time of admission) ?Patient's cognitive ability adequate to safely complete daily activities?: No ?Is the patient deaf or have difficulty hearing?: Yes ?Does the patient have difficulty seeing, even when wearing glasses/contacts?: Yes ?Does the patient have difficulty concentrating, remembering, or making decisions?: Yes ?Patient able to express need for assistance with ADLs?: Yes ?Does the patient have difficulty dressing or  bathing?: Yes ?Independently performs ADLs?: No ?Does the patient have difficulty walking or climbing stairs?: Yes ?Weakness of Legs: Both ?Weakness of Arms/Hands: None ? ?Permission Sought/Granted ?Permission sought to share information with : Case Manager ?Permission granted to share information with : Yes, Verbal Permission Granted ?   ? Permission granted to share info w AGENCY: Tonita Cong ?   ?   ? ?Emotional Assessment ?Appearance:: Appears stated age ?Attitude/Demeanor/Rapport: Engaged, Gracious ?Affect (typically observed): Pleasant, Appropriate ?Orientation: : Oriented to Self, Oriented to Place, Oriented to  Time, Oriented to Situation ?Alcohol / Substance Use: Not Applicable ?Psych Involvement: No (comment) ? ?Admission diagnosis:  CVA (cerebral vascular accident) ([place]) [I63.9] ?Cerebrovascular accident (CVA), unspecified mechanism ([place]) [I63.9] ?Patient Active Problem List  ? Diagnosis Date Noted  ? CVA (cerebral vascular accident) ([place]) 02/14/2022  ? Depression 02/14/2022  ? Senile purpura ([place]) 10/14/2019  ? Nail, injury by 08/30/2019  ? Foot ulcer ([place]) 08/30/2019  ? Mild aortic valve stenosis 05/03/2019  ? Arthritis of both knees 03/31/2019  ? Chronic CHF ([place]) 03/31/2019  ? Myocardial infarction ([place]) 03/31/2019  ? 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 Rheumatoid arthritis, unspecified ([place]) 03/31/2019  ? Situational anxiety 03/31/2019  ? Essential hypertension, benign 08/13/2018  ? Age related osteoporosis 07/13/2018  ? Closed subcapital fracture of neck of right femur, initial encounter ([place]) 07/08/2018  ? Bradycardia 12/29/2017  ? Pneumonia 11/06/2017  ? Hyperlipidemia, mixed 06/30/2017  ? Cerebral aneurysm without rupture  06/23/2017  ? Bundle branch block, left 12/19/2016  ? Vitamin D deficiency 12/19/2016  ? PSVT (paroxysmal supraventricular tachycardia) ([place]) 11/25/2016  ? B12 deficiency 06/19/2016  ? H/O: CVA (cerebrovascular accident) 03/01/2016  ? Spondylosis of cervical region  without myelopathy or radiculopathy 09/13/2015  ? Primary osteoarthritis of both knees 03/14/2015  ? Arthropathy, lower leg 10/16/2014  ? DDD (degenerative disc disease), lumbar 04/11/2014  ? Lumbar radiculitis 04/11/2014  ? ?PCP:  Baxter Hire, MD ?Pharmacy:   ?[id number] [place] - [address] ?[address] ?[id number]?Phone: [phone]: [phone]? ? ? ? ?Social Determinants of Health (SDOH) Interventions ?  ? ?Readmission Risk Interventions ?   ? View : No data to display.  ?  ?  ?  ? ? ? ?

## 2022-02-25 ENCOUNTER — Ambulatory Visit: Payer: Medicare PPO | Admitting: Dermatology

## 2022-02-25 DIAGNOSIS — L820 Inflamed seborrheic keratosis: Secondary | ICD-10-CM | POA: Diagnosis not present

## 2022-02-25 DIAGNOSIS — L578 Other skin changes due to chronic exposure to nonionizing radiation: Secondary | ICD-10-CM

## 2022-02-25 DIAGNOSIS — L570 Actinic keratosis: Secondary | ICD-10-CM

## 2022-02-25 DIAGNOSIS — D692 Other nonthrombocytopenic purpura: Secondary | ICD-10-CM

## 2022-02-25 DIAGNOSIS — L308 Other specified dermatitis: Secondary | ICD-10-CM

## 2022-02-25 DIAGNOSIS — D1800 Hemangioma unspecified site: Secondary | ICD-10-CM

## 2022-02-25 DIAGNOSIS — L821 Other seborrheic keratosis: Secondary | ICD-10-CM

## 2022-02-25 NOTE — Patient Instructions (Signed)

## 2022-02-25 NOTE — Progress Notes (Signed)
? ?New Patient Visit ? ?Subjective  ?Michele [name] is a 88 y.o. female who presents for the following: Other (New patient - 2 spots of left upper arm - Dr Wynetta Emery gave her TMC 0.5% cream./Spot of left cheek). ?The patient has spots, moles and lesions to be evaluated, some may be new or changing and the patient has concerns that these could be cancer. ? ?Accompanied by caregiver ? ?The following portions of the chart were reviewed this encounter and updated as appropriate:  ? Tobacco  Allergies  Meds  Problems  Med Hx  Surg Hx  Fam Hx   ?  ?Review of Systems:  No other skin or systemic complaints except as noted in HPI or Assessment and Plan. ? ?Objective  ?Well appearing patient in no apparent distress; mood and affect are within normal limits. ? ?A focused examination was performed including face, arms. Relevant physical exam findings are noted in the Assessment and Plan. ? ?Right Upper Back x 1, right prox mandible x 1, left cheek x 1 (3) ?Erythematous stuck-on, waxy papule or plaque ? ?Right Forehead ?Erythematous thin papules/macules with gritty scale.  ? ?Left Upper Arm - Anterior ?Pink patches ? ? ?Assessment & Plan  ? ?Actinic Damage ?- chronic, secondary to cumulative UV radiation exposure/sun exposure over time ?- diffuse scaly erythematous macules with underlying dyspigmentation ?- Recommend daily broad spectrum sunscreen SPF 30+ to sun-exposed areas, reapply every 2 hours as needed.  ?- Recommend staying in the shade or wearing long sleeves, sun glasses (UVA+UVB protection) and wide brim hats (4-inch brim around the entire circumference of the hat). ?- Call for new or changing lesions. ? ?Seborrheic Keratoses ?- Stuck-on, waxy, tan-brown papules and/or plaques  ?- Benign-appearing ?- Discussed benign etiology and prognosis. ?- Observe ?- Call for any changes ? ?Hemangiomas ?- Red papules ?- Discussed benign nature ?- Observe ?- Call for any changes ? ?Purpura - Chronic; persistent and recurrent.   Treatable, but not curable. ?- Violaceous macules and patches ?- Benign ?- Related to trauma, age, sun damage and/or use of blood thinners, chronic use of topical and/or oral steroids ?- Observe ?- Can use OTC arnica containing moisturizer such as Dermend Bruise Formula if desired ?- Call for worsening or other concerns ? ?Inflamed seborrheic keratosis (3) ?Right Upper Back x 1, right prox mandible x 1, left cheek x 1 ? ?Destruction of lesion - Right Upper Back x 1, right prox mandible x 1, left cheek x 1 ?Complexity: simple   ?Destruction method: cryotherapy   ?Informed consent: discussed and consent obtained   ?Timeout:  patient name, date of birth, surgical site, and procedure verified ?Lesion destroyed using liquid nitrogen: Yes   ?Region frozen until ice ball extended beyond lesion: Yes   ?Outcome: patient tolerated procedure well with no complications   ?Post-procedure details: wound care instructions given   ? ?AK (actinic keratosis) ?Right Forehead ? ?Destruction of lesion - Right Forehead ?Complexity: simple   ?Destruction method: cryotherapy   ?Informed consent: discussed and consent obtained   ?Timeout:  patient name, date of birth, surgical site, and procedure verified ?Lesion destroyed using liquid nitrogen: Yes   ?Region frozen until ice ball extended beyond lesion: Yes   ?Outcome: patient tolerated procedure well with no complications   ?Post-procedure details: wound care instructions given   ? ?Other eczema ?Left Upper Arm - Anterior ? ?Continue TMC 0.5% cream as prescribed ? ? ?Return in about 3 months (around 05/27/2022) for Follow up. ? ?I,  Ashok Cordia, CMA, am acting as scribe for Sarina Ser, MD . ?Documentation: I have reviewed the above documentation for accuracy and completeness, and I agree with the above. ? ?Sarina Ser, MD ? ?

## 2022-03-05 ENCOUNTER — Encounter: Payer: Self-pay | Admitting: Dermatology

## 2022-05-29 ENCOUNTER — Ambulatory Visit: Payer: Medicare PPO | Admitting: Dermatology

## 2022-06-17 ENCOUNTER — Emergency Department
Admission: EM | Admit: 2022-06-17 | Discharge: 2022-06-17 | Disposition: A | Discharge status: Home / Self Care | Payer: Medicare PPO | Attending: Emergency Medicine | Admitting: Emergency Medicine

## 2022-06-17 ENCOUNTER — Other Ambulatory Visit: Payer: Self-pay

## 2022-06-17 DIAGNOSIS — Z7982 Long term (current) use of aspirin: Secondary | ICD-10-CM | POA: Insufficient documentation

## 2022-06-17 DIAGNOSIS — K068 Other specified disorders of gingiva and edentulous alveolar ridge: Secondary | ICD-10-CM

## 2022-06-17 DIAGNOSIS — I2510 Atherosclerotic heart disease of native coronary artery without angina pectoris: Secondary | ICD-10-CM | POA: Diagnosis not present

## 2022-06-17 DIAGNOSIS — K91841 Postprocedural hemorrhage and hematoma of a digestive system organ or structure following other procedure: Secondary | ICD-10-CM | POA: Diagnosis present

## 2022-06-17 DIAGNOSIS — Z7902 Long term (current) use of antithrombotics/antiplatelets: Secondary | ICD-10-CM | POA: Diagnosis not present

## 2022-06-17 MED ORDER — SILVER NITRATE-POT NITRATE 75-25 % EX MISC
CUTANEOUS | Status: AC
  Filled 2022-06-17: qty 20

## 2022-06-17 MED ORDER — TRANEXAMIC ACID FOR EPISTAXIS
500.0000 mg | Freq: Once | TOPICAL | Status: AC
  Administered 2022-06-17: 500 mg via TOPICAL
  Filled 2022-06-17: qty 10

## 2022-06-17 NOTE — ED Notes (Signed)
Bleeding controled at this time.

## 2022-06-17 NOTE — ED Triage Notes (Signed)
Pt with bleeding from upper right jaw where a tooth was pulled this week. Pt with constant bleeding. Pt is on plavix.

## 2022-06-17 NOTE — ED Notes (Signed)
Quick clot placed on open bleeding gum, pt instructed to bite down.

## 2022-06-17 NOTE — ED Provider Notes (Signed)
   [facility] Provider Note    Event Date/Time   First MD Initiated Contact with Patient 06/17/22 0142     (approximate)  History   Chief Complaint: mouth bleeding  HPI  Michele [name] is a 88 y.o. female with a past medical history of CAD, MI, CVA, presents to the emergency department for bleeding from her mouth.  Patient had a tooth extracted approximately 6 days ago they state at her facility she began having bleeding from the mouth tonight so they transported her to the emergency department.  Per record review patient appears to take aspirin and Plavix daily.  Patient denies any pain.  Physical Exam   Triage Vital Signs: ED Triage Vitals [06/17/22 0127]  Enc Vitals Group     BP (!) 130/49     Pulse Rate 98     Resp (!) 22     Temp      Temp src      SpO2 94 %     Weight 110 lb 3.7 oz (50 kg)     Height '5\' 2"'$  (1.575 m)     Head Circumference      Peak Flow      Pain Score 0     Pain Loc      Pain Edu?      Excl. in [place]?     Most recent vital signs: Vitals:   06/17/22 0127  BP: (!) 130/49  Pulse: 98  Resp: (!) 22  SpO2: 94%    General: Awake, no distress.  CV:  Good peripheral perfusion.  Regular rate and rhythm  Resp:  Normal effort.  Equal breath sounds bilaterally.  Abd:  No distention.  Soft, nontender.  No rebound or guarding. Other:  Patient has an area of recent tooth extraction to the front of the right upper teeth.  Initially bleeding upon presentation, quick clot applied and hemostasis achieved.  No other findings in the mouth.   ED Results / Procedures / Treatments    MEDICATIONS ORDERED IN ED: Medications - No data to display   IMPRESSION / MDM / [place] / ED COURSE  I reviewed the triage vital signs and the nursing notes.  Patient's presentation is most consistent with acute illness / injury with system symptoms.  Patient presents to the emergency department for bleeding from a recent tooth extraction.   Overall the patient appears well, no distress.  The tooth was initially bleeding, quick clot was applied to the gums and hemostasis was achieved.  On my evaluation area is hemostatic.  I have applied a small amount of Surgicel to ensure continued hemostasis.  We have monitored the patient approximately an hour in the emergency department no further bleeding.  We will discharge back to her nursing facility.  Patient has pulled the Surgicel off the gum and it has started to rebleed.  We will apply TXA soaked gauze to the area and monitor to ensure hemostasis.  Patient had some mild bleeding after TXA.  Used a silver nitrate stick and chemically cauterized the area.  No further bleeding after this.  Monitored for at least an additional hour with no further bleeding.  Patient discharged home.  FINAL CLINICAL IMPRESSION(S) / ED DIAGNOSES   Postoperative bleeding    Note:  This document was prepared using Dragon voice recognition software and may include unintentional dictation errors.   Harvest Dark, MD 06/17/22 Rae Lips    Harvest Dark, MD 06/18/22 [phone]

## 2022-06-17 NOTE — Discharge Instructions (Signed)
You have been seen in the emergency department today for bleeding from a recent tooth extraction.  The bleeding has been stopped.  Please follow-up with your dentist for recheck/reevaluation.  Return to the emergency department for any significant bleeding.

## 2022-06-17 NOTE — ED Notes (Signed)
Pt continues to bleed. Md in to attempt to cauterize with silver nitrate.

## 2022-06-17 NOTE — ED Notes (Signed)
Pt removed packing in mouth and began bleeding again.

## 2022-06-17 NOTE — ED Notes (Signed)
Pt up to restroom, no bleeding noted from tooth.

## 2022-06-19 ENCOUNTER — Emergency Department
Admission: EM | Admit: 2022-06-19 | Discharge: 2022-06-19 | Disposition: A | Discharge status: Home / Self Care | Payer: Medicare PPO | Attending: Emergency Medicine | Admitting: Emergency Medicine

## 2022-06-19 ENCOUNTER — Emergency Department: Payer: Medicare PPO

## 2022-06-19 ENCOUNTER — Other Ambulatory Visit: Payer: Self-pay

## 2022-06-19 DIAGNOSIS — R4182 Altered mental status, unspecified: Secondary | ICD-10-CM | POA: Diagnosis not present

## 2022-06-19 DIAGNOSIS — G309 Alzheimer's disease, unspecified: Secondary | ICD-10-CM | POA: Diagnosis not present

## 2022-06-19 DIAGNOSIS — N390 Urinary tract infection, site not specified: Secondary | ICD-10-CM | POA: Diagnosis not present

## 2022-06-19 DIAGNOSIS — D649 Anemia, unspecified: Secondary | ICD-10-CM | POA: Insufficient documentation

## 2022-06-19 DIAGNOSIS — E871 Hypo-osmolality and hyponatremia: Secondary | ICD-10-CM | POA: Diagnosis not present

## 2022-06-19 DIAGNOSIS — I2510 Atherosclerotic heart disease of native coronary artery without angina pectoris: Secondary | ICD-10-CM | POA: Insufficient documentation

## 2022-06-19 LAB — CBC WITH DIFFERENTIAL/PLATELET
Abs Immature Granulocytes: 0.02 10*3/uL (ref 0.00–0.07)
Basophils Absolute: 0.1 10*3/uL (ref 0.0–0.1)
Basophils Relative: 1 %
Eosinophils Absolute: 0.4 10*3/uL (ref 0.0–0.5)
Eosinophils Relative: 5 %
HCT: 25.4 % — ABNORMAL LOW (ref 36.0–46.0)
Hemoglobin: 8.2 g/dL — ABNORMAL LOW (ref 12.0–15.0)
Immature Granulocytes: 0 %
Lymphocytes Relative: 22 %
Lymphs Abs: 1.7 10*3/uL (ref 0.7–4.0)
MCH: 28.6 pg (ref 26.0–34.0)
MCHC: 32.3 g/dL (ref 30.0–36.0)
MCV: 88.5 fL (ref 80.0–100.0)
Monocytes Absolute: 0.5 10*3/uL (ref 0.1–1.0)
Monocytes Relative: 7 %
Neutro Abs: 5.2 10*3/uL (ref 1.7–7.7)
Neutrophils Relative %: 65 %
Platelets: 350 10*3/uL (ref 150–400)
RBC: 2.87 MIL/uL — ABNORMAL LOW (ref 3.87–5.11)
RDW: 15 % (ref 11.5–15.5)
WBC: 7.9 10*3/uL (ref 4.0–10.5)
nRBC: 0 % (ref 0.0–0.2)

## 2022-06-19 LAB — URINALYSIS, ROUTINE W REFLEX MICROSCOPIC
Bilirubin Urine: NEGATIVE
Glucose, UA: NEGATIVE mg/dL
Hgb urine dipstick: NEGATIVE
Ketones, ur: NEGATIVE mg/dL
Nitrite: POSITIVE — AB
Protein, ur: NEGATIVE mg/dL
Specific Gravity, Urine: 1.011 (ref 1.005–1.030)
pH: 5 (ref 5.0–8.0)

## 2022-06-19 LAB — CBC
HCT: 24.8 % — ABNORMAL LOW (ref 36.0–46.0)
Hemoglobin: 7.9 g/dL — ABNORMAL LOW (ref 12.0–15.0)
MCH: 28.7 pg (ref 26.0–34.0)
MCHC: 31.9 g/dL (ref 30.0–36.0)
MCV: 90.2 fL (ref 80.0–100.0)
Platelets: 334 10*3/uL (ref 150–400)
RBC: 2.75 MIL/uL — ABNORMAL LOW (ref 3.87–5.11)
RDW: 15.1 % (ref 11.5–15.5)
WBC: 8.6 10*3/uL (ref 4.0–10.5)
nRBC: 0 % (ref 0.0–0.2)

## 2022-06-19 LAB — BASIC METABOLIC PANEL
Anion gap: 5 (ref 5–15)
BUN: 29 mg/dL — ABNORMAL HIGH (ref 8–23)
CO2: 24 mmol/L (ref 22–32)
Calcium: 8.4 mg/dL — ABNORMAL LOW (ref 8.9–10.3)
Chloride: 99 mmol/L (ref 98–111)
Creatinine, Ser: 1.26 mg/dL — ABNORMAL HIGH (ref 0.44–1.00)
GFR, Estimated: 39 mL/min — ABNORMAL LOW (ref >60)
Glucose, Bld: 96 mg/dL (ref 70–99)
Potassium: 4.7 mmol/L (ref 3.5–5.1)
Sodium: 128 mmol/L — ABNORMAL LOW (ref 135–145)

## 2022-06-19 MED ORDER — CEPHALEXIN 500 MG PO CAPS: 500.0000 mg | ORAL_CAPSULE | Freq: Two times a day (BID) | ORAL | 0 refills | Status: AC

## 2022-06-19 MED ORDER — CEPHALEXIN 500 MG PO CAPS
500.0000 mg | ORAL_CAPSULE | Freq: Once | ORAL | Status: AC
  Administered 2022-06-19: 500 mg via ORAL
  Filled 2022-06-19: qty 1

## 2022-06-19 MED ORDER — SODIUM CHLORIDE 0.9 % IV BOLUS
500.0000 mL | Freq: Once | INTRAVENOUS | Status: AC
  Administered 2022-06-19: 500 mL via INTRAVENOUS

## 2022-06-19 MED ORDER — CEPHALEXIN 500 MG PO CAPS: 500.0000 mg | ORAL_CAPSULE | Freq: Two times a day (BID) | ORAL | 0 refills | Status: DC

## 2022-06-19 NOTE — Discharge Instructions (Signed)
Michele [name]'s work-up shows a urinary tract infection.  She should take the Keflex twice daily for the next week as prescribed.  The lab work-up also shows anemia which is worsened from before.  Her hemoglobin is 8.2 today compared to 10 several months ago.  She is not having any signs of bleeding.  She should follow-up with her regular doctor within the next week to have this rechecked.  Her sodium is borderline low at 128.  This was treated with fluids.  Should also be rechecked.  Michele [name] should return to the emergency department immediately for new, worsening, or recurrent change in mental status, confusion or lethargy, high fever, weakness, vomiting or inability to take the medications, abdominal or flank pain, or any other new or worsening symptoms that are concerning.

## 2022-06-19 NOTE — ED Triage Notes (Signed)
Per EMS, Pt, from [facility], presents d/t AMS.  Pt noted to be slightly confused.  A & Ox3.  Pt c/o being tired.  Denies pain.   Per medication list, Pt recently had increases to xanax and hydrocodone/acetaminophen prescriptions.

## 2022-06-19 NOTE — ED Notes (Signed)
Patient transported to CT 

## 2022-06-19 NOTE — ED Notes (Signed)
Upon arrival, caregiver reports the Pt was groggy and mumbling this morning after waking up.  Pt exhibited the same symptoms when her daughter called her.  Pt's speech was clear upon EMS arrival and remains clear in ED.

## 2022-06-19 NOTE — ED Provider Notes (Signed)
[facility] Provider Note    Event Date/Time   First MD Initiated Contact with Patient 06/19/22 [id number]     (approximate)   History   Altered Mental Status   HPI  Michele [name] is a 88 y.o. female with history of Alzheimer's, CAD, MI, CVA who presents from her facility with an episode of altered mental status.  Per EMS and the caregiver, the patient was lethargic and groggy appearing this morning.  This has now resolved and the patient is back to her baseline.  Previously the patient said she felt tired but currently she denies any complaints.  EMS noted that the patient had her doses of alprazolam and hydrocodone increased somewhat recently although the timeline is unclear.   Physical Exam   Triage Vital Signs: ED Triage Vitals  Enc Vitals Group     BP 06/19/22 0850 138/62     Pulse Rate 06/19/22 0850 99     Resp 06/19/22 0850 18     Temp 06/19/22 0850 97.7 F (36.5 C)     Temp Source 06/19/22 0850 Oral     SpO2 06/19/22 0845 97 %     Weight 06/19/22 0850 110 lb (49.9 kg)     Height 06/19/22 0850 '5\' 2"'$  (1.575 m)     Head Circumference --      Peak Flow --      Pain Score 06/19/22 0850 0     Pain Loc --      Pain Edu? --      Excl. in [place]? --     Most recent vital signs: Vitals:   06/19/22 1300 06/19/22 1311  BP: 127/63   Pulse: 68   Resp: 16   Temp:  97.6 F (36.4 C)  SpO2: 94%     General: Alert, oriented x2.  Comfortable appearing. CV:  Good peripheral perfusion.  Resp:  Normal effort.  Abd:  Soft and nontender.  No distention.  Other:  EOMI.  PERRLA.  No facial droop.  Motor intact in all extremities.  Normal coordination.  No peripheral edema.  Moist mucous membranes.   ED Results / Procedures / Treatments   Labs (all labs ordered are listed, but only abnormal results are displayed) Labs Reviewed  BASIC METABOLIC PANEL - Abnormal; Notable for the following components:      Result Value   Sodium 128 (*)    BUN 29 (*)     Creatinine, Ser 1.26 (*)    Calcium 8.4 (*)    GFR, Estimated 39 (*)    All other components within normal limits  CBC - Abnormal; Notable for the following components:   RBC 2.75 (*)    Hemoglobin 7.9 (*)    HCT 24.8 (*)    All other components within normal limits  URINALYSIS, ROUTINE W REFLEX MICROSCOPIC - Abnormal; Notable for the following components:   Color, Urine YELLOW (*)    APPearance CLEAR (*)    Nitrite POSITIVE (*)    Leukocytes,Ua SMALL (*)    Bacteria, UA FEW (*)    All other components within normal limits  CBC WITH DIFFERENTIAL/PLATELET - Abnormal; Notable for the following components:   RBC 2.87 (*)    Hemoglobin 8.2 (*)    HCT 25.4 (*)    All other components within normal limits     EKG  ED ECG REPORT I, Arta Silence, the attending physician, personally viewed and interpreted this ECG.  Date: 06/19/2022 EKG Time: 0849 Rate:  80 Rhythm: Atrial fibrillation QRS Axis: normal Intervals: LBBB ST/T Wave abnormalities: Nonspecific abnormalities Narrative Interpretation: Nonspecific abnormalities with no evidence of acute ischemia; no significant change when compared to EKG of 02/14/2022    RADIOLOGY  CT head: I independently viewed and interpreted the images; there is no ICH or other acute abnormality.  Radiology notes atrophy with chronic small vessel ischemic changes, and old infarcts.  PROCEDURES:  Critical Care performed: No  Procedures   MEDICATIONS ORDERED IN ED: Medications  sodium chloride 0.9 % bolus 500 mL (0 mLs Intravenous Stopped 06/19/22 1145)  cephALEXin (KEFLEX) capsule 500 mg (500 mg Oral Given 06/19/22 1337)     IMPRESSION / MDM / ASSESSMENT AND PLAN / ED COURSE  I reviewed the triage vital signs and the nursing notes.  88 year old female with PMH as noted above presents with a brief episode of lethargy/grogginess which is now resolved.  She is at her baseline currently per her caregiver.  Differential diagnosis  includes, but is not limited to, medication side effect, dehydration, electrolyte abnormality, other metabolic disturbance, UTI, less likely TIA or other CNS cause.  Patient's presentation is most consistent with acute presentation with potential threat to life or bodily function.  We will obtain basic labs, UA, CT head, and reassess.  The patient is on the cardiac monitor to evaluate for evidence of arrhythmia and/or significant heart rate changes.  ----------------------------------------- 1:37 PM on 06/19/2022 -----------------------------------------  The patient remains at her baseline mental status and is asymptomatic.  CT head shows no acute abnormalities.  UA shows findings consistent with a UTI so I will treat with oral Keflex.  Sodium is borderline low so I gave 500 mL of normal saline.  Initial CBC showed a hemoglobin of 7.9 down from 10.4 several months ago.  The patient has not been having any abnormal bleeding or other acute symptoms.  This is normocytic anemia and not consistent with blood loss.  Repeat hemoglobin after 4 hours was 8.2.  At this time, given that the patient is very well-appearing for her age, asymptomatic, and with normal vital signs and no evidence of sepsis or systemic infection, there is no indication for inpatient treatment of the UTI.  She is stable for discharge home with oral antibiotics.  There is also no evidence of TIA or other acute CNS cause of her episode today.  The caretaker agrees with the plan.  I provided thorough return precautions and discharge instructions.   FINAL CLINICAL IMPRESSION(S) / ED DIAGNOSES   Final diagnoses:  Altered mental status, unspecified altered mental status type  Urinary tract infection without hematuria, site unspecified  Hyponatremia  Anemia, unspecified type     Rx / DC Orders   ED Discharge Orders          Ordered    cephALEXin (KEFLEX) 500 MG capsule  2 times daily        06/19/22 1336              Note:  This document was prepared using Dragon voice recognition software and may include unintentional dictation errors.    Arta Silence, MD 06/19/22 1339

## 2022-06-19 NOTE — ED Notes (Signed)
Pt verbalized understanding of discharge instructions. Opportunity for questions provided.  

## 2022-06-19 NOTE — ED Notes (Signed)
Pt and Caregiver concerned about Pt's glasses.  Both informed the Pt was not wearing glasses upon arrival.  Caregiver provided w/ non-emergency EMS number.

## 2022-06-29 ENCOUNTER — Emergency Department: Payer: Medicare PPO

## 2022-06-29 ENCOUNTER — Inpatient Hospital Stay
Admission: EM | Admit: 2022-06-29 | Discharge: 2022-07-01 | DRG: 683 | Disposition: A | Discharge status: Skilled Nursing Facility | Payer: Medicare PPO | Source: Skilled Nursing Facility | Attending: Internal Medicine | Admitting: Internal Medicine

## 2022-06-29 ENCOUNTER — Encounter: Payer: Self-pay | Admitting: Emergency Medicine

## 2022-06-29 ENCOUNTER — Other Ambulatory Visit: Payer: Self-pay

## 2022-06-29 DIAGNOSIS — I10 Essential (primary) hypertension: Secondary | ICD-10-CM | POA: Diagnosis not present

## 2022-06-29 DIAGNOSIS — R109 Unspecified abdominal pain: Secondary | ICD-10-CM | POA: Diagnosis present

## 2022-06-29 DIAGNOSIS — M170 Bilateral primary osteoarthritis of knee: Secondary | ICD-10-CM | POA: Diagnosis present

## 2022-06-29 DIAGNOSIS — Z8673 Personal history of transient ischemic attack (TIA), and cerebral infarction without residual deficits: Secondary | ICD-10-CM | POA: Diagnosis not present

## 2022-06-29 DIAGNOSIS — I350 Nonrheumatic aortic (valve) stenosis: Secondary | ICD-10-CM | POA: Diagnosis not present

## 2022-06-29 DIAGNOSIS — I480 Paroxysmal atrial fibrillation: Secondary | ICD-10-CM | POA: Diagnosis present

## 2022-06-29 DIAGNOSIS — Z90710 Acquired absence of both cervix and uterus: Secondary | ICD-10-CM

## 2022-06-29 DIAGNOSIS — Z7901 Long term (current) use of anticoagulants: Secondary | ICD-10-CM | POA: Diagnosis not present

## 2022-06-29 DIAGNOSIS — I252 Old myocardial infarction: Secondary | ICD-10-CM | POA: Diagnosis not present

## 2022-06-29 DIAGNOSIS — R627 Adult failure to thrive: Secondary | ICD-10-CM | POA: Diagnosis present

## 2022-06-29 DIAGNOSIS — F418 Other specified anxiety disorders: Secondary | ICD-10-CM | POA: Diagnosis not present

## 2022-06-29 DIAGNOSIS — K529 Noninfective gastroenteritis and colitis, unspecified: Secondary | ICD-10-CM | POA: Diagnosis present

## 2022-06-29 DIAGNOSIS — D692 Other nonthrombocytopenic purpura: Secondary | ICD-10-CM | POA: Diagnosis present

## 2022-06-29 DIAGNOSIS — F0283 Dementia in other diseases classified elsewhere, unspecified severity, with mood disturbance: Secondary | ICD-10-CM | POA: Diagnosis present

## 2022-06-29 DIAGNOSIS — Z66 Do not resuscitate: Secondary | ICD-10-CM | POA: Diagnosis present

## 2022-06-29 DIAGNOSIS — N390 Urinary tract infection, site not specified: Secondary | ICD-10-CM

## 2022-06-29 DIAGNOSIS — D631 Anemia in chronic kidney disease: Secondary | ICD-10-CM | POA: Diagnosis present

## 2022-06-29 DIAGNOSIS — R1084 Generalized abdominal pain: Secondary | ICD-10-CM | POA: Diagnosis not present

## 2022-06-29 DIAGNOSIS — F0284 Dementia in other diseases classified elsewhere, unspecified severity, with anxiety: Secondary | ICD-10-CM | POA: Diagnosis present

## 2022-06-29 DIAGNOSIS — R197 Diarrhea, unspecified: Secondary | ICD-10-CM | POA: Diagnosis not present

## 2022-06-29 DIAGNOSIS — F32A Depression, unspecified: Secondary | ICD-10-CM | POA: Diagnosis present

## 2022-06-29 DIAGNOSIS — G309 Alzheimer's disease, unspecified: Secondary | ICD-10-CM | POA: Diagnosis present

## 2022-06-29 DIAGNOSIS — I129 Hypertensive chronic kidney disease with stage 1 through stage 4 chronic kidney disease, or unspecified chronic kidney disease: Secondary | ICD-10-CM | POA: Diagnosis present

## 2022-06-29 DIAGNOSIS — I2510 Atherosclerotic heart disease of native coronary artery without angina pectoris: Secondary | ICD-10-CM | POA: Diagnosis present

## 2022-06-29 DIAGNOSIS — D72829 Elevated white blood cell count, unspecified: Secondary | ICD-10-CM | POA: Diagnosis present

## 2022-06-29 DIAGNOSIS — N1831 Chronic kidney disease, stage 3a: Secondary | ICD-10-CM | POA: Diagnosis present

## 2022-06-29 DIAGNOSIS — E782 Mixed hyperlipidemia: Secondary | ICD-10-CM | POA: Diagnosis present

## 2022-06-29 DIAGNOSIS — Z79899 Other long term (current) drug therapy: Secondary | ICD-10-CM

## 2022-06-29 DIAGNOSIS — I700 Atherosclerosis of aorta: Secondary | ICD-10-CM | POA: Diagnosis present

## 2022-06-29 DIAGNOSIS — N179 Acute kidney failure, unspecified: Secondary | ICD-10-CM | POA: Diagnosis present

## 2022-06-29 HISTORY — DX: Aneurysm of unspecified site: I72.9

## 2022-06-29 LAB — GASTROINTESTINAL PANEL BY PCR, STOOL (REPLACES STOOL CULTURE)

## 2022-06-29 LAB — LACTIC ACID, PLASMA
Lactic Acid, Venous: 1.9 mmol/L (ref 0.5–1.9)
Lactic Acid, Venous: 1.4 mmol/L (ref 0.5–1.9)
Lactic Acid, Venous: 1.6 mmol/L (ref 0.5–1.9)
Lactic Acid, Venous: 1.3 mmol/L (ref 0.5–1.9)

## 2022-06-29 LAB — CBC
HCT: 26.5 % — ABNORMAL LOW (ref 36.0–46.0)
Hemoglobin: 8.3 g/dL — ABNORMAL LOW (ref 12.0–15.0)
MCH: 28.1 pg (ref 26.0–34.0)
MCHC: 31.3 g/dL (ref 30.0–36.0)
MCV: 89.8 fL (ref 80.0–100.0)
Platelets: 447 10*3/uL — ABNORMAL HIGH (ref 150–400)
RBC: 2.95 MIL/uL — ABNORMAL LOW (ref 3.87–5.11)
RDW: 14.6 % (ref 11.5–15.5)
WBC: 18.5 10*3/uL — ABNORMAL HIGH (ref 4.0–10.5)
nRBC: 0 % (ref 0.0–0.2)

## 2022-06-29 LAB — URINALYSIS, ROUTINE W REFLEX MICROSCOPIC
Glucose, UA: NEGATIVE mg/dL
Hgb urine dipstick: NEGATIVE
Ketones, ur: 15 mg/dL — AB
Nitrite: POSITIVE — AB
Protein, ur: 30 mg/dL — AB
Specific Gravity, Urine: 1.015 (ref 1.005–1.030)
pH: 5 (ref 5.0–8.0)

## 2022-06-29 LAB — COMPREHENSIVE METABOLIC PANEL
ALT: 13 U/L (ref 0–44)
AST: 26 U/L (ref 15–41)
Albumin: 3.5 g/dL (ref 3.5–5.0)
Alkaline Phosphatase: 59 U/L (ref 38–126)
Anion gap: 9 (ref 5–15)
BUN: 28 mg/dL — ABNORMAL HIGH (ref 8–23)
CO2: 22 mmol/L (ref 22–32)
Calcium: 8.9 mg/dL (ref 8.9–10.3)
Chloride: 100 mmol/L (ref 98–111)
Creatinine, Ser: 1.66 mg/dL — ABNORMAL HIGH (ref 0.44–1.00)
GFR, Estimated: 28 mL/min — ABNORMAL LOW (ref >60)
Glucose, Bld: 135 mg/dL — ABNORMAL HIGH (ref 70–99)
Potassium: 5.1 mmol/L (ref 3.5–5.1)
Sodium: 131 mmol/L — ABNORMAL LOW (ref 135–145)
Total Bilirubin: 0.6 mg/dL (ref 0.3–1.2)
Total Protein: 6.2 g/dL — ABNORMAL LOW (ref 6.5–8.1)

## 2022-06-29 LAB — C DIFFICILE QUICK SCREEN W PCR REFLEX
C Diff antigen: NEGATIVE
C Diff interpretation: NOT DETECTED
C Diff toxin: NEGATIVE

## 2022-06-29 LAB — URINALYSIS, MICROSCOPIC (REFLEX)

## 2022-06-29 LAB — TROPONIN I (HIGH SENSITIVITY)
Troponin I (High Sensitivity): 12 ng/L (ref <18)
Troponin I (High Sensitivity): 10 ng/L (ref <18)

## 2022-06-29 MED ORDER — ASPIRIN 81 MG PO CHEW
81.0000 mg | CHEWABLE_TABLET | Freq: Every day | ORAL | Status: DC
  Administered 2022-06-30 – 2022-07-01 (×2): 81 mg via ORAL
  Filled 2022-06-29 (×2): qty 1

## 2022-06-29 MED ORDER — ALPRAZOLAM 0.25 MG PO TABS: 0.2500 mg | ORAL_TABLET | Freq: Two times a day (BID) | ORAL | Status: DC | PRN

## 2022-06-29 MED ORDER — CLOPIDOGREL BISULFATE 75 MG PO TABS
75.0000 mg | ORAL_TABLET | Freq: Every day | ORAL | Status: DC
  Administered 2022-06-29 – 2022-07-01 (×3): 75 mg via ORAL
  Filled 2022-06-29 (×3): qty 1

## 2022-06-29 MED ORDER — ACETAMINOPHEN 650 MG RE SUPP: 650.0000 mg | Freq: Four times a day (QID) | RECTAL | Status: DC | PRN

## 2022-06-29 MED ORDER — BUSPIRONE HCL 10 MG PO TABS
10.0000 mg | ORAL_TABLET | Freq: Two times a day (BID) | ORAL | Status: DC
  Administered 2022-06-29 – 2022-07-01 (×4): 10 mg via ORAL
  Filled 2022-06-29 (×4): qty 1

## 2022-06-29 MED ORDER — APIXABAN 2.5 MG PO TABS
2.5000 mg | ORAL_TABLET | Freq: Two times a day (BID) | ORAL | Status: DC
  Administered 2022-06-29 – 2022-06-30 (×2): 2.5 mg via ORAL
  Filled 2022-06-29 (×2): qty 1

## 2022-06-29 MED ORDER — QUETIAPINE FUMARATE 25 MG PO TABS
25.0000 mg | ORAL_TABLET | Freq: Every day | ORAL | Status: DC
  Administered 2022-06-29 – 2022-06-30 (×2): 25 mg via ORAL
  Filled 2022-06-29 (×2): qty 1

## 2022-06-29 MED ORDER — SODIUM CHLORIDE 0.9 % IV BOLUS
500.0000 mL | Freq: Once | INTRAVENOUS | Status: AC
  Administered 2022-06-29: 500 mL via INTRAVENOUS

## 2022-06-29 MED ORDER — ONDANSETRON HCL 4 MG/2ML IJ SOLN: 4.0000 mg | Freq: Four times a day (QID) | INTRAMUSCULAR | Status: DC | PRN

## 2022-06-29 MED ORDER — ONDANSETRON HCL 4 MG PO TABS: 4.0000 mg | ORAL_TABLET | Freq: Four times a day (QID) | ORAL | Status: DC | PRN

## 2022-06-29 MED ORDER — DICLOFENAC SODIUM 1 % EX GEL
4.0000 g | Freq: Four times a day (QID) | CUTANEOUS | Status: DC | PRN
Start: 2022-06-29 — End: 2022-07-01

## 2022-06-29 MED ORDER — SODIUM CHLORIDE 0.9 % IV SOLN: INTRAVENOUS | Status: AC

## 2022-06-29 MED ORDER — SERTRALINE HCL 50 MG PO TABS
25.0000 mg | ORAL_TABLET | Freq: Every day | ORAL | Status: DC
  Administered 2022-06-29 – 2022-06-30 (×2): 25 mg via ORAL
  Filled 2022-06-29 (×2): qty 1

## 2022-06-29 MED ORDER — METOPROLOL SUCCINATE ER 25 MG PO TB24
25.0000 mg | ORAL_TABLET | Freq: Every day | ORAL | Status: DC
  Administered 2022-06-30 – 2022-07-01 (×2): 25 mg via ORAL
  Filled 2022-06-29 (×2): qty 1

## 2022-06-29 MED ORDER — AMLODIPINE BESYLATE 5 MG PO TABS
2.5000 mg | ORAL_TABLET | Freq: Every day | ORAL | Status: DC
  Administered 2022-06-29 – 2022-07-01 (×3): 2.5 mg via ORAL
  Filled 2022-06-29 (×3): qty 1

## 2022-06-29 MED ORDER — SODIUM CHLORIDE 0.9 % IV SOLN
1.0000 g | Freq: Once | INTRAVENOUS | Status: AC
  Administered 2022-06-29: 1 g via INTRAVENOUS
  Filled 2022-06-29: qty 10

## 2022-06-29 MED ORDER — HYDROCODONE-ACETAMINOPHEN 5-325 MG PO TABS
1.0000 | ORAL_TABLET | Freq: Four times a day (QID) | ORAL | Status: DC
  Administered 2022-06-29 – 2022-07-01 (×6): 1 via ORAL
  Filled 2022-06-29 (×6): qty 1

## 2022-06-29 MED ORDER — ACETAMINOPHEN 325 MG PO TABS
650.0000 mg | ORAL_TABLET | Freq: Four times a day (QID) | ORAL | Status: DC | PRN
  Administered 2022-07-01: 650 mg via ORAL
  Filled 2022-06-29: qty 2

## 2022-06-29 NOTE — H&P (Addendum)
History and Physical   Michele [name] JKD:[id number] DOB: [birth year] DOA: 06/29/2022  PCP: Baxter Hire, MD  Patient coming from: [facility]  I have personally briefly reviewed patient's old medical records in [place].  Chief Concern: Abdominal pain and diarrhea  HPI: [name] is a 88 year old female with history of right parietal occipital infarct, anxiety, PAF on Eliquis, depression, hypertension, large hiatal hernia, diverticulosis, who presents emergency department for chief concerns of abdominal pain and diarrhea.  Initial vitals in the emergency department showed temperature of 98.1, respiration rate of 14, heart rate of 65, blood pressure 102/54, SPO2 of 98% on room air.  Serum sodium is 131, potassium 5.1, chloride 100, bicarb 22, BUN of 28, serum creatinine is 1.66, GFR 28, nonfasting blood glucose 135, WBC 18.5, platelets of 447, hemoglobin 8.3.  High sensitive troponin is 12.  Lactic acid is 1.9.  UA is positive for nitrates and trace leukocytes.  Blood cultures x2 are in process.  Urine culture is in process.  C. difficile colitis and GI panel have been ordered and pending collection.  ED treatment: Sodium chloride 500 mL bolus.  At bedside patient was able to tell me her name.  She knows she is in the hospital.  She was not able to tell me her age.  She recognizes her grand daughter-in-law at bedside who is somebody familiar but was not able to notify her relationship or her name.  HPI was provided by granddaughter in law at bedside.  Per granddaughter, patient had constipation on Wednesday, 06/26/2022.  Caregiver reported to family that patient had a blowout at approximately 4:30 AM on 06/29/2022.  Social history: Patient is from [facility].  Family denies history of tobacco, EtOH, recreational drug use.  Patient is retired and formerly worked as a Radio producer.  ROS: Able to complete as patient has memory disturbances/dementia.  ED Course: Discussed  with emergency medicine provider, patient requiring hospitalization for chief concerns of abdominal pain and diarrhea.  Assessment/Plan  Principal Problem:   Abdominal pain Active Problems:   Essential hypertension, benign   Arthritis of both knees   Hyperlipidemia, mixed   Situational anxiety   Mild aortic valve stenosis   Senile purpura (HCC)   Depression   AKI (acute kidney injury) ([place])   Diarrhea   PAF (paroxysmal atrial fibrillation) (HCC)   Failure to thrive in adult   Leukocytosis   Assessment and Plan:  * Abdominal pain - Differential diagnosis include infectious diarrhea versus small bowel obstruction - Check GI and C. difficile colitis panel - Presumed secondary to colitis - Continue supportive measures and check for infectious diarrhea panel  Essential hypertension, benign - Resumed home amlodipine 2.5 mg daily on day of admission - Metoprolol succinate 25 mg daily for 06/30/22 due to low normotensive on admission  Leukocytosis - Presumed secondary to Clostridioides difficile colitis - CBC in a.m.  Failure to thrive in adult - Per daughter, Baker Janus over the phone, patient has been declining over the past 6 months to 1 year. - Daughter who is healthcare preparatory would like to try treatment if patient tested positive for C. difficile colitis and give IV fluids for now. If patient does not improve daughter wishes to discuss hospice for possible comfort measures  PAF (paroxysmal atrial fibrillation) (HCC) - Resumed home Eliquis 2.5 mg p.o. twice daily, metoprolol succinate 25 mg daily  Diarrhea - Check C. difficile and GI panel given age, recent hospital presentation, with recent antibiotic use  AKI (acute kidney injury) ([place]) - I suspect this is prerenal secondary to GI loss - Holding home ramipril and spironolactone - Continue with sodium chloride 75 mL/h, 1 day ordered - BMP in a.m.  Depression - Resumed home buspirone 10 mg twice daily, sertraline 25 mg  nightly  Memory decline and mood changes-Per daughter patient does not have formal diagnosis of dementia however patient's is exhibiting signs consistent with this diagnosis  Chart reviewed.   Complete echo with bubble study on 02/14/2022: LV ejection fraction estimated 50 to 55%, left ventricular diastolic parameters were indeterminate.  DVT prophylaxis: Eliquis Code Status: DNR Diet: heart healthy Family Communication: Carylon Perches, grand daughter-in-law; Madaline Savage [phone] Disposition Plan: pending clinical course, guarded prognosis  Consults called: not at this time Admission status: telemetry medical   Past Medical History:  Diagnosis Date   Aneurysm ([place])    LBBB (left bundle branch block)    MI (myocardial infarction) ([place])    Stroke [facility])    Past Surgical History:  Procedure Laterality Date   ABDOMINAL HYSTERECTOMY     APPENDECTOMY     HIP PINNING,CANNULATED Right 07/08/2018   Procedure: CANNULATED HIP PINNING;  Surgeon: Leim Fabry, MD;  Location: [facility] ORS;  Service: Orthopedics;  Laterality: Right;   ROTATOR CUFF REPAIR Left    Social History:  reports that she has never smoked. She has never used smokeless tobacco. She reports that she does not drink alcohol and does not use drugs.  Allergies  Allergen Reactions   Penicillins Rash    Has patient had a PCN reaction causing immediate rash, facial/tongue/throat swelling, SOB or lightheadedness with hypotension: Yes Has patient had a PCN reaction causing severe rash involving mucus membranes or skin necrosis: No Has patient had a PCN reaction that required hospitalization: No Has patient had a PCN reaction occurring within the last 10 years: No If all of the above answers are "NO", then may proceed with Cephalosporin use.   History reviewed. No pertinent family history. Family history: Family history reviewed and not pertinent.  Prior to Admission medications   Medication Sig Start Date End Date Taking? Authorizing  Provider  ALPRAZolam (XANAX) 0.25 MG tablet Take 0.25 mg by mouth 2 (two) times daily as needed. 05/23/20   [provider]  amLODipine (NORVASC) 2.5 MG tablet Take 2.5 mg by mouth daily.    [provider]  aspirin 81 MG chewable tablet Chew 1 tablet (81 mg total) by mouth daily. 02/15/22   Lorella Nimrod, MD  busPIRone (BUSPAR) 10 MG tablet Take 10 mg by mouth 2 (two) times daily. 06/26/20   [provider]  clopidogrel (PLAVIX) 75 MG tablet Take 1 tablet (75 mg total) by mouth daily. 02/15/22   Lorella Nimrod, MD  diclofenac sodium (VOLTAREN) 1 % GEL Apply 4 g topically 4 (four) times daily as needed (pain).    [provider]  ELIQUIS 2.5 MG TABS tablet Take 2.5 mg by mouth 2 (two) times daily. 05/19/22   [provider]  HYDROcodone-acetaminophen (NORCO/VICODIN) 5-325 MG tablet Take 1 tablet by mouth 4 (four) times daily. 08/29/18   [provider]  metoprolol succinate (TOPROL-XL) 25 MG 24 hr tablet Take 1 tablet by mouth daily. 08/16/20   [provider]  QUEtiapine (SEROQUEL) 50 MG tablet Take 25 mg by mouth at bedtime. 08/30/20   [provider]  ramipril (ALTACE) 5 MG capsule Take 5 mg by mouth daily. 07/18/20   [provider]  sertraline (ZOLOFT) 25 MG  tablet Take 25 mg by mouth at bedtime. 07/14/20   [provider]  spironolactone (ALDACTONE) 25 MG tablet Take 12.5 mg by mouth daily. 01/01/21   [provider]   Physical Exam: Vitals:   06/29/22 0834 06/29/22 1330 06/29/22 1545  BP: (!) 102/54 (!) 122/58   Pulse: 65 62 (!) 56  Resp: 14 (!) 23 16  Temp: 98.1 F (36.7 C)    TempSrc: Oral    SpO2: 98% 98% 100%  Weight: 45.4 kg    Height: '5\' 2"'$  (1.575 m)     Constitutional: appears age appropriate, frail, cachectic, NAD, calm, comfortable Eyes: PERRL, lids and conjunctivae normal ENMT: Mucous membranes are moist. Posterior pharynx clear of any exudate or lesions. Age-appropriate dentition.  Hearing appropriate Neck: normal, supple, no masses, no thyromegaly Respiratory: clear to auscultation bilaterally, no wheezing, no crackles. Normal respiratory effort. No accessory muscle use.  Cardiovascular: Regular rate and rhythm, no murmurs / rubs / gallops. No extremity edema. 2+ pedal pulses. No carotid bruits.  Abdomen: no tenderness, no masses palpated, no hepatosplenomegaly. Bowel sounds positive.  Musculoskeletal: no clubbing / cyanosis. No joint deformity upper and lower extremities. Good ROM, no contractures, no atrophy. Normal muscle tone.  Skin: Pale, diffuse small ecchymosis consistent with thin skin, no rashes, lesions, ulcers. No induration Neurologic: Sensation intact. Strength 5/5 in all 4.  Psychiatric: Unable to assess judgment and insight as patient is exhibiting signs of dementia. Alert and oriented x self and location.  Depressed mood  EKG: independently reviewed, showing sinus rhythm with rate of 72, QTc 479, possible first-degree AV block  Chest x-ray on Admission: I personally reviewed and I agree with radiologist reading as below.  CT ABDOMEN PELVIS WO CONTRAST  Result Date: 06/29/2022 CLINICAL DATA:  88 year old female with acute abdominal and pelvic pain and diarrhea. EXAM: CT ABDOMEN AND PELVIS WITHOUT CONTRAST TECHNIQUE: Multidetector CT imaging of the abdomen and pelvis was performed following the standard protocol without IV contrast. RADIATION DOSE REDUCTION: This exam was performed according to the departmental dose-optimization program which includes automated exposure control, adjustment of the mA and/or kV according to patient size and/or use of iterative reconstruction technique. COMPARISON:  02/14/2022 CT and prior studies FINDINGS: Please note that parenchymal and vascular abnormalities may be missed as intravenous contrast was not administered. Lower chest: No new abnormalities. Bibasilar atelectasis/scarring again noted. Hepatobiliary: The liver and  gallbladder are unremarkable. There is no evidence of intrahepatic or extrahepatic biliary dilatation. Pancreas: Atrophic without other significant abnormality Spleen: Unremarkable Adrenals/Urinary Tract: The kidneys, adrenal glands and bladder are unremarkable. Stomach/Bowel: There is equivocal mild circumferential wall thickening of the descending and sigmoid colon, suspicious for colitis. There are few mildly distended gas and fluid-filled loops of small bowel with distal bowel loops collapsed. No discrete transition point is identified. A large hiatal hernia is noted. Colonic diverticulosis is identified. Vascular/Lymphatic: Aortic atherosclerosis. No enlarged abdominal or pelvic lymph nodes. Reproductive: A 4.1 x 3.1 cm RIGHT ovary/adnexal mass is unchanged. The patient is status post hysterectomy. No LEFT adnexal mass. Other: No free fluid or focal collection/abscess. Musculoskeletal: No acute or suspicious bony abnormalities are noted. Thoracic and lumbar compression fractures are again noted. Degenerative changes in the lumbar spine and surgical hardware within the proximal RIGHT femur again noted. IMPRESSION: 1. Equivocal mild circumferential wall thickening of the descending and sigmoid colon, suspicious for colitis. 2. Nonspecific mildly distended gas and fluid-filled loops of small bowel with distal bowel loops collapsed. This may represent a mild  ileus or low-grade partial small bowel obstruction. No evidence of pneumoperitoneum. 3. Unchanged 4.1 x 3.1 cm RIGHT ovary/adnexal mass. 4. Large hiatal hernia. 5. Aortic Atherosclerosis (ICD10-I70.0). Electronically Signed   By: Margarette Canada M.D.   On: 06/29/2022 14:51   CT HEAD WO CONTRAST (5MM)  Result Date: 06/29/2022 CLINICAL DATA:  Altered mental status EXAM: CT HEAD WITHOUT CONTRAST TECHNIQUE: Contiguous axial images were obtained from the base of the skull through the vertex without intravenous contrast. RADIATION DOSE REDUCTION: This exam was  performed according to the departmental dose-optimization program which includes automated exposure control, adjustment of the mA and/or kV according to patient size and/or use of iterative reconstruction technique. COMPARISON:  06/19/2022 FINDINGS: Brain: No acute intracranial findings are seen. There are no signs of bleeding within the cranium. Cortical sulci are prominent. There is old infarct in right parieto-occipital cortex with no significant change. There is decreased density in periventricular white matter. Vascular: There are coarse calcifications in the arteries. There is ectasia of the supraclinoid portions of ICA with no significant change. Skull: Unremarkable. Sinuses/Orbits: There is mucosal thickening in the ethmoid sinus. Other: None. IMPRESSION: No acute intracranial findings are seen in noncontrast CT brain. Atrophy. Old infarct in the right parieto-occipital cortex. Small-vessel disease. Other findings as described in the body of the report. Electronically Signed   By: Elmer Picker M.D.   On: 06/29/2022 14:35    Labs on Admission: I have personally reviewed following labs  CBC: Recent Labs  Lab 06/29/22 0836  WBC 18.5*  HGB 8.3*  HCT 26.5*  MCV 89.8  PLT 591*   Basic Metabolic Panel: Recent Labs  Lab 06/29/22 0836  NA 131*  K 5.1  CL 100  CO2 22  GLUCOSE 135*  BUN 28*  CREATININE 1.66*  CALCIUM 8.9   GFR: Estimated Creatinine Clearance: 14.2 mL/min (A) (by C-G formula based on SCr of 1.66 mg/dL (H)).  Liver Function Tests: Recent Labs  Lab 06/29/22 0836  AST 26  ALT 13  ALKPHOS 59  BILITOT 0.6  PROT 6.2*  ALBUMIN 3.5   Urine analysis:    Component Value Date/Time   COLORURINE YELLOW 06/29/2022 0836   APPEARANCEUR CLEAR 06/29/2022 0836   LABSPEC 1.015 06/29/2022 0836   PHURINE 5.0 06/29/2022 0836   GLUCOSEU NEGATIVE 06/29/2022 0836   HGBUR NEGATIVE 06/29/2022 0836   BILIRUBINUR MODERATE (A) 06/29/2022 0836   KETONESUR 15 (A) 06/29/2022 0836    PROTEINUR 30 (A) 06/29/2022 0836   NITRITE POSITIVE (A) 06/29/2022 0836   LEUKOCYTESUR TRACE (A) 06/29/2022 0836   Dr. Tobie Poet [facility]  If 7PM-7AM, please contact overnight-coverage provider If 7AM-7PM, please contact day coverage provider [email or web address]  06/29/2022, 5:50 PM

## 2022-06-29 NOTE — Hospital Course (Addendum)
[name] is a 88 year old female with history of right parietal occipital infarct, anxiety, PAF on Eliquis, depression, hypertension, large hiatal hernia, diverticulosis, who presents emergency department for chief concerns of abdominal pain and diarrhea.  Initial vitals in the emergency department showed temperature of 98.1, respiration rate of 14, heart rate of 65, blood pressure 102/54, SPO2 of 98% on room air.  Serum sodium is 131, potassium 5.1, chloride 100, bicarb 22, BUN of 28, serum creatinine is 1.66, GFR 28, nonfasting blood glucose 135, WBC 18.5, platelets of 447, hemoglobin 8.3.  High sensitive troponin is 12.  Lactic acid is 1.9.  UA is positive for nitrates and trace leukocytes.  Blood cultures x2 are in process.  Urine culture is in process.  C. difficile colitis and GI panel have been ordered and pending collection.  ED treatment: Sodium chloride 500 mL bolus.

## 2022-06-29 NOTE — ED Triage Notes (Signed)
Difficult to obtain sx exactly from pt.  Grandaughter-in-law reports generalized weakness and diarrhea since yesterday as far as she knows.  Pt disoriented to year but oriented to place and person.  Unlabored. VSS.  Treated for recent UTI but has finished abx.  No fevers.

## 2022-06-29 NOTE — Assessment & Plan Note (Signed)
-   Resumed home buspirone 10 mg twice daily, sertraline 25 mg nightly

## 2022-06-29 NOTE — Assessment & Plan Note (Signed)
-   Per daughter, Baker Janus over the phone, patient has been declining over the past 6 months to 1 year. - Daughter who is healthcare preparatory would like to try treatment if patient tested positive for C. difficile colitis and give IV fluids for now. If patient does not improve daughter wishes to discuss hospice for possible comfort measures

## 2022-06-29 NOTE — Plan of Care (Signed)
  Problem: Education: Goal: Knowledge of General Education information will improve Description: Including pain rating scale, medication(s)/side effects and non-pharmacologic comfort measures Outcome: Progressing   Problem: Clinical Measurements: Goal: Diagnostic test results will improve Outcome: Progressing   Problem: Coping: Goal: Level of anxiety will decrease Outcome: Progressing   

## 2022-06-29 NOTE — Assessment & Plan Note (Addendum)
-   I suspect this is prerenal secondary to GI loss - Holding home ramipril and spironolactone - Continue with sodium chloride 75 mL/h, 1 day ordered - BMP in a.m.

## 2022-06-29 NOTE — ED Triage Notes (Signed)
Pt in via EMS from [facility] with c/o weakness. Pt with diarrhea as well. 104/41, 97.5 temp, pt has DNR with her.

## 2022-06-29 NOTE — Assessment & Plan Note (Signed)
-   Resumed home Eliquis 2.5 mg p.o. twice daily, metoprolol succinate 25 mg daily

## 2022-06-29 NOTE — Assessment & Plan Note (Signed)
-   Check C. difficile and GI panel given age, recent hospital presentation, with recent antibiotic use

## 2022-06-29 NOTE — Assessment & Plan Note (Addendum)
-   Presumed secondary to Clostridioides difficile colitis - CBC in a.m.

## 2022-06-29 NOTE — ED Provider Notes (Signed)
[facility] Provider Note    Event Date/Time   First MD Initiated Contact with Patient 06/29/22 1323     (approximate)   History   Diarrhea and Weakness   HPI  Michele [name] is a 88 y.o. female  Alzheimer's, CAD, MI, CVA, A-fib on Eliquis who presents from her facility who comes in for diarrhea and weakness.  They are concerned about some generalized weakness and diarrhea that is been going on since yesterday patient did have recent treatment for UTI.  Was recently on a course of Keflex for UTI.  They are concerned that patient is just more weak than her normal self.  When asked patient how she feels she just states I just do not feel well but is unable to really tell me what does not feel well.  She is alert and oriented x2.  She denies any known falls but history is somewhat limited due to her history of dementia.      Physical Exam   Triage Vital Signs: ED Triage Vitals [06/29/22 0834]  Enc Vitals Group     BP (!) 102/54     Pulse Rate 65     Resp 14     Temp 98.1 F (36.7 C)     Temp Source Oral     SpO2 98 %     Weight 100 lb (45.4 kg)     Height '5\' 2"'$  (1.575 m)     Head Circumference      Peak Flow      Pain Score 0     Pain Loc      Pain Edu?      Excl. in [place]?     Most recent vital signs: Vitals:   06/29/22 0834  BP: (!) 102/54  Pulse: 65  Resp: 14  Temp: 98.1 F (36.7 C)  SpO2: 98%     General: Awake, no distress.  CV:  Good peripheral perfusion.  Resp:  Normal effort.  Abd:  No distention.  Other:  Is alert and oriented x2.  Her abdomen seems overall reassuring.  She is able to move both arms and lift up both legs.   ED Results / Procedures / Treatments   Labs (all labs ordered are listed, but only abnormal results are displayed) Labs Reviewed  CBC - Abnormal; Notable for the following components:      Result Value   WBC 18.5 (*)    RBC 2.95 (*)    Hemoglobin 8.3 (*)    HCT 26.5 (*)    Platelets 447 (*)    All  other components within normal limits  URINALYSIS, ROUTINE W REFLEX MICROSCOPIC - Abnormal; Notable for the following components:   Bilirubin Urine MODERATE (*)    Ketones, ur 15 (*)    Protein, ur 30 (*)    Nitrite POSITIVE (*)    Leukocytes,Ua TRACE (*)    All other components within normal limits  COMPREHENSIVE METABOLIC PANEL - Abnormal; Notable for the following components:   Sodium 131 (*)    Glucose, Bld 135 (*)    BUN 28 (*)    Creatinine, Ser 1.66 (*)    Total Protein 6.2 (*)    GFR, Estimated 28 (*)    All other components within normal limits  URINALYSIS, MICROSCOPIC (REFLEX) - Abnormal; Notable for the following components:   Bacteria, UA MANY (*)    All other components within normal limits  CBG MONITORING, ED  TROPONIN I (HIGH  SENSITIVITY)  TROPONIN I (HIGH SENSITIVITY)     EKG  My interpretation of EKG:  Sinus rate on ekg read but I think more likely afib of 72 with left bundle branch block and first-degree AV block.  Reviewed prior EKG and she has a history of A-fib with left bundle branch block    RADIOLOGY I have reviewed the CT head personally interpreted no ich   PROCEDURES:  Critical Care performed: No  .1-3 Lead EKG Interpretation  Performed by: Vanessa Gladwin, MD Authorized by: Vanessa Seguin, MD     Interpretation: abnormal     ECG rate:  56   ECG rate assessment: normal     Rhythm: atrial fibrillation     Ectopy: none     Conduction: normal      MEDICATIONS ORDERED IN ED: Medications - No data to display   IMPRESSION / MDM / [place] / ED COURSE  I reviewed the triage vital signs and the nursing notes.   Patient's presentation is most consistent with acute presentation with potential threat to life or bodily function.   Differential includes [place] abnormalities, AKI, UTI.  Labs ordered from triage but given patient is on Eliquis with increasing weakness will get CT head to make sure no evidence of intracranial  hemorrhage and CT abdomen to look for any abdominal infection given history somewhat limited due to her dementia.  I will also order stool studies to look for any pathogens as well as C. difficile given recent Keflex use.  Troponin is negative.  White count is elevated at 18.  Her hemoglobin is around her baseline from 10 days ago.  Her urine looks concerning for UTI.  Although no WBC she did have recent treatment but does still have many bacteria.  We will send for urine culture.  Her CMP shows elevated creatinine     IMPRESSION: 1. Equivocal mild circumferential wall thickening of the descending and sigmoid colon, suspicious for colitis. 2. Nonspecific mildly distended gas and fluid-filled loops of small bowel with distal bowel loops collapsed. This may represent a mild ileus or low-grade partial small bowel obstruction. No evidence of pneumoperitoneum. 3. Unchanged 4.1 x 3.1 cm RIGHT ovary/adnexal mass. 4. Large hiatal hernia. 5. Aortic Atherosclerosis (ICD10-I70.0).  Stool studies pending for colitis will cover with antibiotic for now for potential UTI discussed the hospital team for admission.   The patient is on the cardiac monitor to evaluate for evidence of arrhythmia and/or significant heart rate changes.      FINAL CLINICAL IMPRESSION(S) / ED DIAGNOSES   Final diagnoses:  AKI (acute kidney injury) ([place])  Urinary tract infection without hematuria, site unspecified  Colitis     Rx / DC Orders   ED Discharge Orders     None        Note:  This document was prepared using Dragon voice recognition software and may include unintentional dictation errors.   Vanessa [place], MD 06/29/22 [phone]

## 2022-06-29 NOTE — Assessment & Plan Note (Addendum)
-   Differential diagnosis include infectious diarrhea versus small bowel obstruction - Check GI and C. difficile colitis panel - Presumed secondary to colitis - Continue supportive measures and check for infectious diarrhea panel

## 2022-06-29 NOTE — Assessment & Plan Note (Signed)
-   Resumed home amlodipine 2.5 mg daily on day of admission - Metoprolol succinate 25 mg daily for 06/30/22 due to low normotensive on admission

## 2022-06-30 DIAGNOSIS — R1084 Generalized abdominal pain: Secondary | ICD-10-CM | POA: Diagnosis not present

## 2022-06-30 LAB — CBC
HCT: 20.4 % — ABNORMAL LOW (ref 36.0–46.0)
Hemoglobin: 6.6 g/dL — ABNORMAL LOW (ref 12.0–15.0)
MCH: 28.6 pg (ref 26.0–34.0)
MCHC: 32.4 g/dL (ref 30.0–36.0)
MCV: 88.3 fL (ref 80.0–100.0)
Platelets: 369 10*3/uL (ref 150–400)
RBC: 2.31 MIL/uL — ABNORMAL LOW (ref 3.87–5.11)
RDW: 14.7 % (ref 11.5–15.5)
WBC: 9.9 10*3/uL (ref 4.0–10.5)
nRBC: 0 % (ref 0.0–0.2)

## 2022-06-30 LAB — HEMOGLOBIN AND HEMATOCRIT, BLOOD
HCT: 25.4 % — ABNORMAL LOW (ref 36.0–46.0)
Hemoglobin: 8.3 g/dL — ABNORMAL LOW (ref 12.0–15.0)

## 2022-06-30 LAB — PREPARE RBC (CROSSMATCH)

## 2022-06-30 LAB — BASIC METABOLIC PANEL
Anion gap: 5 (ref 5–15)
BUN: 24 mg/dL — ABNORMAL HIGH (ref 8–23)
CO2: 22 mmol/L (ref 22–32)
Calcium: 8.2 mg/dL — ABNORMAL LOW (ref 8.9–10.3)
Chloride: 107 mmol/L (ref 98–111)
Creatinine, Ser: 1.15 mg/dL — ABNORMAL HIGH (ref 0.44–1.00)
GFR, Estimated: 44 mL/min — ABNORMAL LOW (ref >60)
Glucose, Bld: 97 mg/dL (ref 70–99)
Potassium: 4.4 mmol/L (ref 3.5–5.1)
Sodium: 134 mmol/L — ABNORMAL LOW (ref 135–145)

## 2022-06-30 LAB — HEMOGLOBIN: Hemoglobin: 6.8 g/dL — ABNORMAL LOW (ref 12.0–15.0)

## 2022-06-30 MED ORDER — SODIUM CHLORIDE 0.9% IV SOLUTION
Freq: Once | INTRAVENOUS | Status: AC
Start: 2022-06-30 — End: 2022-06-30

## 2022-06-30 MED ORDER — LEVOFLOXACIN 500 MG PO TABS
750.0000 mg | ORAL_TABLET | ORAL | Status: DC
  Administered 2022-06-30: 750 mg via ORAL
  Filled 2022-06-30: qty 2

## 2022-06-30 MED ORDER — CIPROFLOXACIN HCL 500 MG PO TABS: 500.0000 mg | ORAL_TABLET | Freq: Every day | ORAL | Status: DC

## 2022-06-30 NOTE — Progress Notes (Signed)
Michele [name] at [place] NAME: Michele [name]    MR#:  [id number]  DATE OF BIRTH:  [birth year]  SUBJECTIVE:   Nephew and his wife in the room patient came in with abdominal pain and diarrhea from [name] Today feeling a whole lot better. Tolerating PO diet. No diarrhea reported. No fever.   VITALS:  Blood pressure (!) 123/55, pulse 70, temperature 98 F (36.7 C), resp. rate 15, height '5\' 2"'$  (1.575 m), weight 45.4 kg, SpO2 100 %.  PHYSICAL EXAMINATION:   GENERAL:  88 y.o.-year-old patient lying in the bed with no acute distress.  LUNGS: Normal breath sounds bilaterally, no wheezing, rales, rhonchi.  CARDIOVASCULAR: S1, S2 normal. No murmurs, rubs, or gallops.  ABDOMEN: Soft, nontender, nondistended. Bowel sounds present.  EXTREMITIES: No  edema b/l.    NEUROLOGIC: nonfocal  patient is alert and awake, weak SKIN: No obvious rash, lesion, or ulcer.   LABORATORY PANEL:  CBC Recent Labs  Lab 06/30/22 0515  WBC 9.9  HGB 6.6*  HCT 20.4*  PLT 369    Chemistries  Recent Labs  Lab 06/29/22 0836 06/30/22 0515  NA 131* 134*  K 5.1 4.4  CL 100 107  CO2 22 22  GLUCOSE 135* 97  BUN 28* 24*  CREATININE 1.66* 1.15*  CALCIUM 8.9 8.2*  AST 26  --   ALT 13  --   ALKPHOS 59  --   BILITOT 0.6  --    Cardiac Enzymes No results for input(s): "TROPONINI" in the last 168 hours. RADIOLOGY:  CT ABDOMEN PELVIS WO CONTRAST  Result Date: 06/29/2022 CLINICAL DATA:  88 year old female with acute abdominal and pelvic pain and diarrhea. EXAM: CT ABDOMEN AND PELVIS WITHOUT CONTRAST TECHNIQUE: Multidetector CT imaging of the abdomen and pelvis was performed following the standard protocol without IV contrast. RADIATION DOSE REDUCTION: This exam was performed according to the departmental dose-optimization program which includes automated exposure control, adjustment of the mA and/or kV according to patient size and/or use of iterative reconstruction  technique. COMPARISON:  02/14/2022 CT and prior studies FINDINGS: Please note that parenchymal and vascular abnormalities may be missed as intravenous contrast was not administered. Lower chest: No new abnormalities. Bibasilar atelectasis/scarring again noted. Hepatobiliary: The liver and gallbladder are unremarkable. There is no evidence of intrahepatic or extrahepatic biliary dilatation. Pancreas: Atrophic without other significant abnormality Spleen: Unremarkable Adrenals/Urinary Tract: The kidneys, adrenal glands and bladder are unremarkable. Stomach/Bowel: There is equivocal mild circumferential wall thickening of the descending and sigmoid colon, suspicious for colitis. There are few mildly distended gas and fluid-filled loops of small bowel with distal bowel loops collapsed. No discrete transition point is identified. A large hiatal hernia is noted. Colonic diverticulosis is identified. Vascular/Lymphatic: Aortic atherosclerosis. No enlarged abdominal or pelvic lymph nodes. Reproductive: A 4.1 x 3.1 cm RIGHT ovary/adnexal mass is unchanged. The patient is status post hysterectomy. No LEFT adnexal mass. Other: No free fluid or focal collection/abscess. Musculoskeletal: No acute or suspicious bony abnormalities are noted. Thoracic and lumbar compression fractures are again noted. Degenerative changes in the lumbar spine and surgical hardware within the proximal RIGHT femur again noted. IMPRESSION: 1. Equivocal mild circumferential wall thickening of the descending and sigmoid colon, suspicious for colitis. 2. Nonspecific mildly distended gas and fluid-filled loops of small bowel with distal bowel loops collapsed. This may represent a mild ileus or low-grade partial small bowel obstruction. No evidence of pneumoperitoneum. 3. Unchanged 4.1 x 3.1 cm RIGHT ovary/adnexal mass.  4. Large hiatal hernia. 5. Aortic Atherosclerosis (ICD10-I70.0). Electronically Signed   By: Michele [name] M.D.   On: 06/29/2022 14:51   CT  HEAD WO CONTRAST (5MM)  Result Date: 06/29/2022 CLINICAL DATA:  Altered mental status EXAM: CT HEAD WITHOUT CONTRAST TECHNIQUE: Contiguous axial images were obtained from the base of the skull through the vertex without intravenous contrast. RADIATION DOSE REDUCTION: This exam was performed according to the departmental dose-optimization program which includes automated exposure control, adjustment of the mA and/or kV according to patient size and/or use of iterative reconstruction technique. COMPARISON:  06/19/2022 FINDINGS: Brain: No acute intracranial findings are seen. There are no signs of bleeding within the cranium. Cortical sulci are prominent. There is old infarct in right parieto-occipital cortex with no significant change. There is decreased density in periventricular white matter. Vascular: There are coarse calcifications in the arteries. There is ectasia of the supraclinoid portions of ICA with no significant change. Skull: Unremarkable. Sinuses/Orbits: There is mucosal thickening in the ethmoid sinus. Other: None. IMPRESSION: No acute intracranial findings are seen in noncontrast CT brain. Atrophy. Old infarct in the right parieto-occipital cortex. Small-vessel disease. Other findings as described in the body of the report. Electronically Signed   By: Michele [name] M.D.   On: 06/29/2022 14:35    Assessment and Plan Michele [name] is a 88 year old female with history of right parietal occipital infarct, anxiety, PAF on Eliquis, depression, hypertension, large hiatal hernia, diverticulosis, who presents emergency department for chief concerns of abdominal pain and diarrhea.   CT abdomen/Pelvis Equivocal mild circumferential wall thickening of the descending and sigmoid colon, suspicious for colitis. 2. Nonspecific mildly distended gas and fluid-filled loops of small bowel with distal bowel loops collapsed. This may represent a mild ileus or low-grade partial small bowel obstruction. No  evidence of pneumoperitoneum. 3. Unchanged 4.1 x 3.1 cm RIGHT ovary/adnexal mass. 4. Large hiatal hernia. 5. Aortic Atherosclerosis (ICD10-I70.0).  Stool for C. diff and G.I. PCR stool negative  Abdominal pain suspected due to colitis -  GI and C. difficile colitis panel-- negative - Presumed secondary to colitis - Continue supportive measures  -- she was on IV Rocephin. Will change to PO Levaquin -- WBC normal,no fever. Patient tolerating PO diet.   Essential hypertension, benign - Miller pain and metoprolol  Leukocytosis - Presumed secondary to Clostridioides difficile colitis - 18.5-->9.9   Failure to thrive in adult - Per daughter, Baker Janus over the phone, patient has been declining over the past 6 months to 1 year. - Referral for outpatient palliative care  PAF (paroxysmal atrial fibrillation) ([place]) -ok to d/c asa,plavix and eliquis with chronic anemia and possibility of slow gi spoke with daughter at length. Patient overall is stable. I did discuss about outpatient palliative care to follow at her facility and in the event patient declines convert to hospice care. Daughter is in agreement and appreciated the idea of palliative care.  -- She also is in agreement with no aggressive evaluation do treated treatable. Agreeable for blood transfusion.   Acute on chronic symptomatic anemia history of chronic anticoagulation -- baseline hemoglobin eight-- 10.0 -- came in with hemoglobin of 8.3-- 6.6-- 6.8-- one unit blood transfusion -- daughter agrees with discontinuing blood thinners including aspirin Plavix -- according to the daughter patient recently had significant amount of bleed with her tooth extraction  AKI (acute kidney injury) ([place]) - I suspect this is prerenal secondary to GI loss - Holding home ramipril and spironolactone -- received IV fluids - creat  improving  Depression - Resumed home buspirone, sertraline    Memory decline and mood changes-Per daughter patient  does not have formal diagnosis of dementia however patient's is exhibiting signs consistent with this diagnosis.  Will get referral for outpatient palliative care   Procedures: Family communication : daughter Baker Janus on the phone Consults : none CODE STATUS: DNR DVT Prophylaxis : SCD Level of care: Med-Surg Status is: Inpatient Remains inpatient appropriate because: blood transfusion, treatment of colitis, anticipate discharge likely tomorrow    TOTAL TIME TAKING CARE OF THIS PATIENT: 35 minutes.  >50% time spent on counselling and coordination of care  Note: This dictation was prepared with Dragon dictation along with smaller phrase technology. Any transcriptional errors that result from this process are unintentional.  Fritzi Mandes M.D    [facility]   CC: Primary care physician; Baxter Hire, MD

## 2022-06-30 NOTE — TOC Initial Note (Signed)
Transition of Care [facility]) - Initial/Assessment Note    Patient Details  Name: Michele [name] MRN: [id number] Date of Birth: 1924-07-21  Transition of Care [facility]) CM/SW Contact:    Elliot Gurney [place], [place] Phone Number: 06/30/2022, 3:47 PM  Clinical Narrative:                 Damaris Schooner to patient's daughter [name] [phone] by phone to discuss recommendation for palliative care. Per patient's daughter, patient is from [facility] and has 24 hour care. Patient's daughter is agreeable to referral for palliative care through [place]. Patient to discharge back to [facility] tomorrow, per daughter a family member will transport patient home. Patient is followed by [facility] and [facility] [place], [place]. She is agreeable to sending the referral to the Palliative Care team who will reach out to patient's daughter.  Transition of Care to continue to follow  [facility], LCSW Transition of Care [phone]   Expected Discharge Plan: Home/Self Care (return to [facility] with Palliative Care) Barriers to Discharge: Continued Medical Work up   Patient Goals and CMS Choice Patient states their goals for this hospitalization and ongoing recovery are:: Per daughter "to return home with Palliative Care"      Expected Discharge Plan and Services Expected Discharge Plan: Home/Self Care (return to [facility] with Palliative Care) In-house Referral: Clinical Social Work                                            Prior Living Arrangements/Services   Lives with:: Facility Resident [facility])          Need for Family Participation in Patient Care: Yes (Comment) Care giver support system in place?: Yes (comment) Current home services: Homehealth aide (24 /7 care) Criminal Activity/Legal Involvement Pertinent to Current Situation/Hospitalization: No - Comment as needed  Activities of Daily  Living   ADL Screening (condition at time of admission) Patient's cognitive ability adequate to safely complete daily activities?: No Is the patient deaf or have difficulty hearing?: No Does the patient have difficulty seeing, even when wearing glasses/contacts?: No Does the patient have difficulty concentrating, remembering, or making decisions?: Yes Patient able to express need for assistance with ADLs?: Yes Does the patient have difficulty dressing or bathing?: Yes Independently performs ADLs?: No Communication: Independent Dressing (OT): Needs assistance Grooming: Independent Feeding: Independent Bathing: Needs assistance Toileting: Needs assistance In/Out Bed: Needs assistance Walks in Home: Needs assistance Does the patient have difficulty walking or climbing stairs?: Yes Weakness of Legs: Both Weakness of Arms/Hands: None  Permission Sought/Granted                  Emotional Assessment         Alcohol / Substance Use: Not Applicable Psych Involvement: No (comment)  Admission diagnosis:  Colitis [K52.9] AKI (acute kidney injury) ([place]) [N17.9] Abdominal pain [R10.9] Urinary tract infection without hematuria, site unspecified [N39.0] Patient Active Problem List   Diagnosis Date Noted   Abdominal pain 06/29/2022   AKI (acute kidney injury) ([place]) 06/29/2022   Diarrhea 06/29/2022   PAF (paroxysmal atrial fibrillation) ([place]) 06/29/2022   Failure to thrive in adult 06/29/2022   Leukocytosis 06/29/2022   CVA (cerebral vascular accident) ([place]) 02/14/2022   Depression 02/14/2022   Senile purpura ([place]) 10/14/2019   Nail, injury by 08/30/2019  Foot ulcer ([place]) 08/30/2019   Mild aortic valve stenosis 05/03/2019   Arthritis of both knees 03/31/2019   Chronic CHF ([place]) 03/31/2019   Myocardial infarction ([place]) 03/31/2019   Osteoporosis, 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/31/2019   Rheumatoid arthritis, unspecified ([place]) 03/31/2019   Situational anxiety 03/31/2019   Essential  hypertension, benign 08/13/2018   Age related osteoporosis 07/13/2018   Closed subcapital fracture of neck of right femur, initial encounter ([place]) 07/08/2018   Bradycardia 12/29/2017   Pneumonia 11/06/2017   Hyperlipidemia, mixed 06/30/2017   Cerebral aneurysm without rupture 06/23/2017   Bundle branch block, left 12/19/2016   Vitamin D deficiency 12/19/2016   PSVT (paroxysmal supraventricular tachycardia) ([place]) 11/25/2016   B12 deficiency 06/19/2016   H/O: CVA (cerebrovascular accident) 03/01/2016   Spondylosis of cervical region without myelopathy or radiculopathy 09/13/2015   Primary osteoarthritis of both knees 03/14/2015   Arthropathy, lower leg 10/16/2014   DDD (degenerative disc disease), lumbar 04/11/2014   Lumbar radiculitis 04/11/2014   PCP:  Baxter Hire, MD Pharmacy[facility]   [id number] [place] [id number]: [phone]: [phone]    Social Determinants of Health (SDOH) Interventions    Readmission Risk Interventions     No data to display

## 2022-06-30 NOTE — Plan of Care (Signed)

## 2022-06-30 NOTE — Progress Notes (Signed)
[facility]) Hospital Liaison Note   Notified by [facility] Manager, Chrystal, of family request for [facility] Palliative services after discharge.    [facility] hospital liaison will follow patient for discharge disposition.   Please call with any hospice or outpatient palliative care related questions.    Thank you for the opportunity to participate in this patient's care.   Bea Laura MSN RN [facility] Liaison [phone]

## 2022-07-01 DIAGNOSIS — K529 Noninfective gastroenteritis and colitis, unspecified: Secondary | ICD-10-CM | POA: Diagnosis not present

## 2022-07-01 LAB — BPAM RBC
Blood Product Expiration Date: 202310022359
ISSUE DATE / TIME: 202308271555
Unit Type and Rh: 5100

## 2022-07-01 LAB — TYPE AND SCREEN
ABO/RH(D): O POS
Antibody Screen: NEGATIVE
Unit division: 0

## 2022-07-01 MED ORDER — LEVOFLOXACIN 750 MG PO TABS: 750.0000 mg | ORAL_TABLET | ORAL | 0 refills | Status: AC

## 2022-07-01 NOTE — NC FL2 (Signed)
[place] LEVEL OF CARE SCREENING TOOL     IDENTIFICATION  Patient Name: Michele [name] Birthdate: [birth year] Sex: female Admission Date (Current Location): 06/29/2022  [facility] and [place] Number:  Engineering geologist and Address:  [facility], [address], [place], [place] [address]      Provider Number: [id number]  Attending Physician Name and Address:  Fritzi Mandes, MD  Relative Name and Phone Number:  Edd Fabian [phone]    Current Level of Care: Hospital Recommended Level of Care: [place] Prior Approval Number:    Date Approved/Denied:   PASRR Number:    Discharge Plan: Other (Comment) (ALF)    Current Diagnoses: Patient Active Problem List   Diagnosis Date Noted   Colitis    Abdominal pain 06/29/2022   AKI (acute kidney injury) ([place]) 06/29/2022   Diarrhea 06/29/2022   PAF (paroxysmal atrial fibrillation) ([place]) 06/29/2022   Failure to thrive in adult 06/29/2022   Leukocytosis 06/29/2022   CVA (cerebral vascular accident) ([place]) 02/14/2022   Depression 02/14/2022   Senile purpura ([place]) 10/14/2019   Nail, injury by 08/30/2019   Foot ulcer ([place]) 08/30/2019   Mild aortic valve stenosis 05/03/2019   Arthritis of both knees 03/31/2019   Chronic CHF ([place]) 03/31/2019   Myocardial infarction ([place]) 03/31/2019   Osteoporosis, 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/31/2019   Rheumatoid arthritis, unspecified ([place]) 03/31/2019   Situational anxiety 03/31/2019   Essential hypertension, benign 08/13/2018   Age related osteoporosis 07/13/2018   Closed subcapital fracture of neck of right femur, initial encounter ([place]) 07/08/2018   Bradycardia 12/29/2017   Pneumonia 11/06/2017   Hyperlipidemia, mixed 06/30/2017   Cerebral aneurysm without rupture 06/23/2017   Bundle branch block, left 12/19/2016   Vitamin D deficiency 12/19/2016   PSVT (paroxysmal supraventricular tachycardia) ([place]) 11/25/2016   B12 deficiency 06/19/2016   H/O: CVA  (cerebrovascular accident) 03/01/2016   Spondylosis of cervical region without myelopathy or radiculopathy 09/13/2015   Primary osteoarthritis of both knees 03/14/2015   Arthropathy, lower leg 10/16/2014   DDD (degenerative disc disease), lumbar 04/11/2014   Lumbar radiculitis 04/11/2014    Orientation RESPIRATION BLADDER Height & Weight     Self, Place  Normal Continent, External catheter Weight: 45.4 kg Height:  '5\' 2"'$  (157.5 cm)  BEHAVIORAL SYMPTOMS/MOOD NEUROLOGICAL BOWEL NUTRITION STATUS      Continent Diet (See dc summary)  AMBULATORY STATUS COMMUNICATION OF NEEDS Skin   Extensive Assist Verbally Normal                       Personal Care Assistance Level of Assistance  Bathing, Feeding, Dressing Bathing Assistance: Maximum assistance Feeding assistance: Limited assistance Dressing Assistance: Maximum assistance     Functional Limitations Info             SPECIAL CARE FACTORS FREQUENCY                       Contractures Contractures Info: Not present    Additional Factors Info  Code Status, Allergies Code Status Info: DNR Allergies Info: Penicillin           Current Medications (07/01/2022):  This is the current hospital active medication list Current Facility-Administered Medications  Medication Dose Route Frequency Provider Last Rate Last Admin   acetaminophen (TYLENOL) tablet 650 mg  650 mg Oral Q6H PRN Cox, Amy N, DO   650 mg at 07/01/22 0130   Or   acetaminophen (TYLENOL) suppository 650 mg  650 mg Rectal Q6H PRN Cox, Amy N, DO       ALPRAZolam (XANAX) tablet 0.25 mg  0.25 mg Oral BID PRN Cox, Amy N, DO       amLODipine (NORVASC) tablet 2.5 mg  2.5 mg Oral Daily Cox, Amy N, DO   2.5 mg at 07/01/22 6599   aspirin chewable tablet 81 mg  81 mg Oral Daily Cox, Amy N, DO   81 mg at 07/01/22 0819   busPIRone (BUSPAR) tablet 10 mg  10 mg Oral BID Cox, Amy N, DO   10 mg at 07/01/22 3570   diclofenac Sodium (VOLTAREN) 1 % topical gel 4 g  4 g  Topical QID PRN Cox, Amy N, DO       HYDROcodone-acetaminophen (NORCO/VICODIN) 5-325 MG per tablet 1 tablet  1 tablet Oral QID Cox, Amy N, DO   1 tablet at 07/01/22 0820   levofloxacin (LEVAQUIN) tablet 750 mg  750 mg Oral Q48H Fritzi Mandes, MD   750 mg at 06/30/22 1755   metoprolol succinate (TOPROL-XL) 24 hr tablet 25 mg  25 mg Oral Daily Cox, Amy N, DO   25 mg at 07/01/22 0819   ondansetron (ZOFRAN) tablet 4 mg  4 mg Oral Q6H PRN Cox, Amy N, DO       Or   ondansetron (ZOFRAN) injection 4 mg  4 mg Intravenous Q6H PRN Cox, Amy N, DO       QUEtiapine (SEROQUEL) tablet 25 mg  25 mg Oral QHS Cox, Amy N, DO   25 mg at 06/30/22 2205   sertraline (ZOLOFT) tablet 25 mg  25 mg Oral QHS Cox, Amy N, DO   25 mg at 06/30/22 2205     Discharge Medications: Please see discharge summary for a list of discharge medications.  Relevant Imaging Results:  Relevant Lab Results:   Additional Information [id number]  Conception Oms, RN

## 2022-07-01 NOTE — Plan of Care (Signed)

## 2022-07-01 NOTE — Plan of Care (Signed)

## 2022-07-01 NOTE — Progress Notes (Signed)
Patient discharging back to [facility]. Inez Catalina (caregiver) given discharge instruction, verbalized understanding. Patient finishing her breakfast and then caregiver to transport patient to [facility].

## 2022-07-01 NOTE — Discharge Summary (Signed)
Physician Discharge Summary   Patient: Michele [name] MRN: [id number] DOB: Sep 16, 1924  Admit date:     06/29/2022  Discharge date: 07/01/22  Discharge Physician: Fritzi Mandes   PCP: Baxter Hire, MD   Recommendations at discharge:    F/u Dr Edwina Barth in 1-2 weeks  Discharge Diagnoses: Acute colitis  Hospital Course:  Michele [name] is a 88 year old female with history of right parietal occipital infarct, anxiety, PAF on Eliquis, depression, hypertension, large hiatal hernia, diverticulosis, who presents emergency department for chief concerns of abdominal pain and diarrhea.   CT abdomen/Pelvis Equivocal mild circumferential wall thickening of the descending and sigmoid colon, suspicious for colitis. 2. Nonspecific mildly distended gas and fluid-filled loops of small bowel with distal bowel loops collapsed. This may represent a mild ileus or low-grade partial small bowel obstruction. No evidence of pneumoperitoneum. 3. Unchanged 4.1 x 3.1 cm RIGHT ovary/adnexal mass. 4. Large hiatal hernia. 5. Aortic Atherosclerosis (ICD10-I70.0).   Stool for C. diff and G.I. PCR stool negative   Abdominal pain suspected due to colitis -  GI and C. difficile colitis panel-- negative - Presumed secondary to colitis - Continue supportive measures  -- she was on IV Rocephin. Will change to PO Levaquin for 7 days -- WBC normal,no fever. Patient tolerating PO diet.   Essential hypertension, benign  Cont current BP meds   Leukocytosis - Presumed secondary to Clostridioides difficile colitis - 18.5-->9.9   Failure to thrive in adult - Per daughter, Baker Janus over the phone, patient has been declining over the past 6 months to 1 year. - Referral for outpatient palliative care made by TOC   PAF (paroxysmal atrial fibrillation) ([place]) -ok to d/c asa,plavix and eliquis with chronic anemia and possibility of slow gi spoke with daughter at length. Patient overall is stable. I did discuss about outpatient  palliative care to follow at her facility and in the event patient declines transition to hospice care. Daughter is in agreement and appreciated the idea of palliative care.  -- She also is in agreement with no aggressive evaluation do treated treatable. Agreeable for blood transfusion.    Acute on chronic symptomatic anemia history of chronic anticoagulation -- baseline hemoglobin eight-- 10.0 -- came in with hemoglobin of 8.3-- 6.6-- 6.8-- s/p one unit blood transfusion--8.3 -- daughter agrees with discontinuing blood thinners including  Plavix --will cont ASA 81 mg -- according to the daughter patient recently had significant amount of bleed with her tooth extraction as well   AKI (acute kidney injury) ([place]) - I suspect this is prerenal secondary to GI loss - Holding home ramipril and spironolactone -- received IV fluids - creat improving   Depression - Resumed home buspirone, sertraline    Memory decline and mood changes-Per daughter patient does not have formal diagnosis of dementia however patient's is exhibiting signs consistent with this diagnosis.    outpatient palliative care per [facility] D/[facility] to [facility]   Procedures:none Family communication : daughter Baker Janus on the phone agrees with plan Consults : none CODE STATUS: DNR DVT Prophylaxis : SCD       Disposition: Assisted living Diet recommendation:  Discharge Diet Orders (From admission, onward)     Start     Ordered   07/01/22 0000  Diet - low sodium heart healthy        07/01/22 0853           Cardiac diet DISCHARGE MEDICATION: Allergies as of 07/01/2022       Reactions  Penicillins Rash   Has patient had a PCN reaction causing immediate rash, facial/tongue/throat swelling, SOB or lightheadedness with hypotension: Yes Has patient had a PCN reaction causing severe rash involving mucus membranes or skin necrosis: No Has patient had a PCN reaction that required hospitalization: No Has patient had a PCN  reaction occurring within the last 10 years: No If all of the above answers are "NO", then may proceed with Cephalosporin use.        Medication List     STOP taking these medications    amLODipine 2.5 MG tablet Commonly known as: NORVASC   clopidogrel 75 MG tablet Commonly known as: PLAVIX   Eliquis 2.5 MG Tabs tablet Generic drug: apixaban       TAKE these medications    ALPRAZolam 0.25 MG tablet Commonly known as: XANAX Take 0.25 mg by mouth 2 (two) times daily as needed.   aspirin 81 MG chewable tablet Chew 1 tablet (81 mg total) by mouth daily.   busPIRone 10 MG tablet Commonly known as: BUSPAR Take 10 mg by mouth 2 (two) times daily.   diclofenac sodium 1 % Gel Commonly known as: VOLTAREN Apply 4 g topically 4 (four) times daily as needed (pain).   HYDROcodone-acetaminophen 5-325 MG tablet Commonly known as: NORCO/VICODIN Take 1 tablet by mouth 4 (four) times daily.   levofloxacin 750 MG tablet Commonly known as: LEVAQUIN Take 1 tablet (750 mg total) by mouth every other day for 3 doses. Start taking on: July 02, 2022   metoprolol succinate 25 MG 24 hr tablet Commonly known as: TOPROL-XL Take 25 mg by mouth daily.   QUEtiapine 50 MG tablet Commonly known as: SEROQUEL Take 25 mg by mouth at bedtime.   ramipril 5 MG capsule Commonly known as: ALTACE Take 5 mg by mouth daily.   sertraline 25 MG tablet Commonly known as: ZOLOFT Take 25 mg by mouth at bedtime.   spironolactone 25 MG tablet Commonly known as: ALDACTONE Take 12.5 mg by mouth daily.        Follow-up Information     Baxter Hire, MD. Schedule an appointment as soon as possible for a visit in 1 week(s).   Specialty: Internal Medicine Why: hospital f/u Contact information: [place] [address] [phone]                Discharge Exam: Danley Danker Weights   06/29/22 0834  Weight: 45.4 kg     Condition at discharge: fair  The results  of significant diagnostics from this hospitalization (including imaging, microbiology, ancillary and laboratory) are listed below for reference.   Imaging Studies: CT ABDOMEN PELVIS WO CONTRAST  Result Date: 06/29/2022 CLINICAL DATA:  88 year old female with acute abdominal and pelvic pain and diarrhea. EXAM: CT ABDOMEN AND PELVIS WITHOUT CONTRAST TECHNIQUE: Multidetector CT imaging of the abdomen and pelvis was performed following the standard protocol without IV contrast. RADIATION DOSE REDUCTION: This exam was performed according to the departmental dose-optimization program which includes automated exposure control, adjustment of the mA and/or kV according to patient size and/or use of iterative reconstruction technique. COMPARISON:  02/14/2022 CT and prior studies FINDINGS: Please note that parenchymal and vascular abnormalities may be missed as intravenous contrast was not administered. Lower chest: No new abnormalities. Bibasilar atelectasis/scarring again noted. Hepatobiliary: The liver and gallbladder are unremarkable. There is no evidence of intrahepatic or extrahepatic biliary dilatation. Pancreas: Atrophic without other significant abnormality Spleen: Unremarkable Adrenals/Urinary Tract: The kidneys, adrenal glands and bladder  are unremarkable. Stomach/Bowel: There is equivocal mild circumferential wall thickening of the descending and sigmoid colon, suspicious for colitis. There are few mildly distended gas and fluid-filled loops of small bowel with distal bowel loops collapsed. No discrete transition point is identified. A large hiatal hernia is noted. Colonic diverticulosis is identified. Vascular/Lymphatic: Aortic atherosclerosis. No enlarged abdominal or pelvic lymph nodes. Reproductive: A 4.1 x 3.1 cm RIGHT ovary/adnexal mass is unchanged. The patient is status post hysterectomy. No LEFT adnexal mass. Other: No free fluid or focal collection/abscess. Musculoskeletal: No acute or suspicious  bony abnormalities are noted. Thoracic and lumbar compression fractures are again noted. Degenerative changes in the lumbar spine and surgical hardware within the proximal RIGHT femur again noted. IMPRESSION: 1. Equivocal mild circumferential wall thickening of the descending and sigmoid colon, suspicious for colitis. 2. Nonspecific mildly distended gas and fluid-filled loops of small bowel with distal bowel loops collapsed. This may represent a mild ileus or low-grade partial small bowel obstruction. No evidence of pneumoperitoneum. 3. Unchanged 4.1 x 3.1 cm RIGHT ovary/adnexal mass. 4. Large hiatal hernia. 5. Aortic Atherosclerosis (ICD10-I70.0). Electronically Signed   By: Margarette Canada M.D.   On: 06/29/2022 14:51   CT HEAD WO CONTRAST (5MM)  Result Date: 06/29/2022 CLINICAL DATA:  Altered mental status EXAM: CT HEAD WITHOUT CONTRAST TECHNIQUE: Contiguous axial images were obtained from the base of the skull through the vertex without intravenous contrast. RADIATION DOSE REDUCTION: This exam was performed according to the departmental dose-optimization program which includes automated exposure control, adjustment of the mA and/or kV according to patient size and/or use of iterative reconstruction technique. COMPARISON:  06/19/2022 FINDINGS: Brain: No acute intracranial findings are seen. There are no signs of bleeding within the cranium. Cortical sulci are prominent. There is old infarct in right parieto-occipital cortex with no significant change. There is decreased density in periventricular white matter. Vascular: There are coarse calcifications in the arteries. There is ectasia of the supraclinoid portions of ICA with no significant change. Skull: Unremarkable. Sinuses/Orbits: There is mucosal thickening in the ethmoid sinus. Other: None. IMPRESSION: No acute intracranial findings are seen in noncontrast CT brain. Atrophy. Old infarct in the right parieto-occipital cortex. Small-vessel disease. Other  findings as described in the body of the report. Electronically Signed   By: Elmer Picker M.D.   On: 06/29/2022 14:35   CT Head Wo Contrast  Result Date: 06/19/2022 CLINICAL DATA:  Transit ischemic attack, slight confusion, altered mental status EXAM: CT HEAD WITHOUT CONTRAST TECHNIQUE: Contiguous axial images were obtained from the base of the skull through the vertex without intravenous contrast. RADIATION DOSE REDUCTION: This exam was performed according to the departmental dose-optimization program which includes automated exposure control, adjustment of the mA and/or kV according to patient size and/or use of iterative reconstruction technique. COMPARISON:  02/14/2022; correlation MR a 02/14/2022 FINDINGS: Brain: Generalized atrophy. Normal ventricular morphology. No midline shift or mass effect. Small vessel chronic ischemic changes of deep cerebral white matter. Old RIGHT occipital infarct. Old small deep RIGHT cerebellar infarcts. Old medial inferior LEFT cerebellar infarct. No intracranial hemorrhage, mass lesion, or evidence of acute infarction. No extra-axial fluid collections. Vascular: Extensive atherosclerotic calcifications of internal carotid and vertebral arteries at skull base. No hyperdense vessels. 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 Skull: Intact Sinuses/Orbits: Clear Other: N/A IMPRESSION: Atrophy with small vessel chronic ischemic changes of deep cerebral white matter. Multiple  old infarcts as above. No acute intracranial abnormalities. 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 Electronically Signed   By: Lavonia Dana M.D.   On: 06/19/2022 09:32    Microbiology: Results for orders placed or performed during the hospital encounter of 06/29/22  Blood culture (routine x 2)      Status: None (Preliminary result)   Collection Time: 06/29/22  1:36 PM   Specimen: BLOOD  Result Value Ref Range Status   Specimen Description BLOOD RIGHT ANTECUBITAL  Final   Special Requests   Final    BOTTLES DRAWN AEROBIC AND ANAEROBIC Blood Culture results may not be optimal due to an excessive volume of blood received in culture bottles   Culture   Final    NO GROWTH 2 DAYS Performed at [facility], [address] [place], [place] [address]    Report Status PENDING  Incomplete  Urine Culture     Status: Abnormal (Preliminary result)   Collection Time: 06/29/22  1:36 PM   Specimen: Urine, Random  Result Value Ref Range Status   Specimen Description   Final    URINE, RANDOM Performed at [facility], [address] [place], [place] [address]    Special Requests   Final    NONE Performed at [facility], [address], [place], [place] [address]    Culture (A)  Final    >=100,000 COLONIES/mL Lonell Grandchild NEGATIVE RODS SUSCEPTIBILITIES TO FOLLOW Performed at [facility], [place] [address] [place], [place] [address]    Report Status PENDING  Incomplete  Blood culture (routine x 2)     Status: None (Preliminary result)   Collection Time: 06/29/22  1:41 PM   Specimen: BLOOD  Result Value Ref Range Status   Specimen Description BLOOD RIGHT ANTECUBITAL  Final   Special Requests   Final    BOTTLES DRAWN AEROBIC AND ANAEROBIC Blood Culture results may not be optimal due to an excessive volume of blood received in culture bottles   Culture   Final    NO GROWTH 2 DAYS Performed at [facility], [address] [place], [place] [address]    Report Status PENDING  Incomplete  Gastrointestinal Panel by PCR , Stool     Status: None   Collection Time: 06/29/22  8:09 PM   Specimen: Stool  Result Value Ref Range Status   Campylobacter species NOT DETECTED NOT DETECTED Final   Plesimonas shigelloides NOT DETECTED NOT DETECTED Final    Salmonella species NOT DETECTED NOT DETECTED Final   Yersinia enterocolitica NOT DETECTED NOT DETECTED Final   Vibrio species NOT DETECTED NOT DETECTED Final   Vibrio cholerae NOT DETECTED NOT DETECTED Final   Enteroaggregative E coli (EAEC) NOT DETECTED NOT DETECTED Final   Enteropathogenic E coli (EPEC) NOT DETECTED NOT DETECTED Final   Enterotoxigenic E coli (ETEC) NOT DETECTED NOT DETECTED Final   Shiga like toxin producing E coli (STEC) NOT DETECTED NOT DETECTED Final   Shigella/Enteroinvasive E coli (EIEC) NOT DETECTED NOT DETECTED Final   Cryptosporidium NOT DETECTED NOT DETECTED Final   Cyclospora cayetanensis NOT DETECTED NOT DETECTED Final   Entamoeba histolytica NOT DETECTED NOT DETECTED Final   Giardia lamblia NOT DETECTED NOT DETECTED Final   Adenovirus F40/41 NOT DETECTED NOT DETECTED Final   Astrovirus NOT DETECTED NOT DETECTED Final   Norovirus GI/GII NOT DETECTED NOT DETECTED Final  Rotavirus A NOT DETECTED NOT DETECTED Final   Sapovirus (I, II, IV, and V) NOT DETECTED NOT DETECTED Final    Comment: Performed at [facility], [address] [place], [place] [address]  C Difficile Quick Screen w PCR reflex     Status: None   Collection Time: 06/29/22  8:09 PM   Specimen: STOOL  Result Value Ref Range Status   C Diff antigen NEGATIVE NEGATIVE Final   C Diff toxin NEGATIVE NEGATIVE Final   C Diff interpretation No C. difficile detected.  Final    Comment: NEGATIVE Performed at [facility], [address] [place], [place] [address]     Labs: CBC: Recent Labs  Lab 06/29/22 [phone] 06/30/22 0515 06/30/22 1335 06/30/22 2012  WBC 18.5* 9.9  --   --   HGB 8.3* 6.6* 6.8* 8.3*  HCT 26.5* 20.4*  --  25.4*  MCV 89.8 88.3  --   --   PLT 447* 369  --   --    Basic Metabolic Panel: Recent Labs  Lab 06/29/22 0836 06/30/22 0515  NA 131* 134*  K 5.1 4.4  CL 100 107  CO2 22 22  GLUCOSE 135* 97  BUN 28* 24*  CREATININE 1.66* 1.15*  CALCIUM 8.9  8.2*   Liver Function Tests: Recent Labs  Lab 06/29/22 0836  AST 26  ALT 13  ALKPHOS 59  BILITOT 0.6  PROT 6.2*  ALBUMIN 3.5   CBG: No results for input(s): "GLUCAP" in the last 168 hours.  Discharge time spent: greater than 30 minutes.  Signed: Fritzi Mandes, MD [facility] 07/01/2022

## 2022-07-01 NOTE — Plan of Care (Signed)
  Problem: Education: Goal: Knowledge of General Education information will improve Description: Including pain rating scale, medication(s)/side effects and non-pharmacologic comfort measures 07/01/2022 0115 by Julian Hy, RN Outcome: Progressing 07/01/2022 0112 by Julian Hy, RN Outcome: Progressing   Problem: Health Behavior/Discharge Planning: Goal: Ability to manage health-related needs will improve 07/01/2022 0115 by Julian Hy, RN Outcome: Progressing 07/01/2022 0112 by Julian Hy, RN Outcome: Progressing   Problem: Clinical Measurements: Goal: Ability to maintain clinical measurements within normal limits will improve 07/01/2022 0115 by Julian Hy, RN Outcome: Progressing 07/01/2022 0112 by Julian Hy, RN Outcome: Progressing Goal: Will remain free from infection 07/01/2022 0115 by Julian Hy, RN Outcome: Progressing 07/01/2022 0112 by Julian Hy, RN Outcome: Progressing Goal: Diagnostic test results will improve 07/01/2022 0115 by Julian Hy, RN Outcome: Progressing 07/01/2022 0112 by Julian Hy, RN Outcome: Progressing Goal: Respiratory complications will improve 07/01/2022 0115 by Julian Hy, RN Outcome: Progressing 07/01/2022 0112 by Julian Hy, RN Outcome: Progressing Goal: Cardiovascular complication will be avoided 07/01/2022 0115 by Julian Hy, RN Outcome: Progressing 07/01/2022 0112 by Julian Hy, RN Outcome: Progressing   Problem: Activity: Goal: Risk for activity intolerance will decrease 07/01/2022 0115 by Julian Hy, RN Outcome: Progressing 07/01/2022 0112 by Julian Hy, RN Outcome: Progressing   Problem: Nutrition: Goal: Adequate nutrition will be maintained 07/01/2022 0115 by Julian Hy, RN Outcome: Progressing 07/01/2022 0112 by Julian Hy, RN Outcome:  Progressing   Problem: Coping: Goal: Level of anxiety will decrease 07/01/2022 0115 by Julian Hy, RN Outcome: Progressing 07/01/2022 0112 by Julian Hy, RN Outcome: Progressing   Problem: Elimination: Goal: Will not experience complications related to bowel motility 07/01/2022 0115 by Julian Hy, RN Outcome: Progressing 07/01/2022 0112 by Julian Hy, RN Outcome: Progressing Goal: Will not experience complications related to urinary retention 07/01/2022 0115 by Julian Hy, RN Outcome: Progressing 07/01/2022 0112 by Julian Hy, RN Outcome: Progressing   Problem: Pain Managment: Goal: General experience of comfort will improve 07/01/2022 0115 by Julian Hy, RN Outcome: Progressing 07/01/2022 0112 by Julian Hy, RN Outcome: Progressing   Problem: Safety: Goal: Ability to remain free from injury will improve 07/01/2022 0115 by Julian Hy, RN Outcome: Progressing 07/01/2022 0112 by Julian Hy, RN Outcome: Progressing   Problem: Skin Integrity: Goal: Risk for impaired skin integrity will decrease 07/01/2022 0115 by Julian Hy, RN Outcome: Progressing 07/01/2022 0112 by Julian Hy, RN Outcome: Progressing

## 2022-07-01 NOTE — Progress Notes (Signed)
Patient discharging back to [facility] today. Caregiver at bedside can transport patient there. [name] (daughter) notified of patients discharge plan.

## 2022-07-02 LAB — URINE CULTURE: Culture: >=100000 — AB

## 2022-07-03 ENCOUNTER — Telehealth: Payer: Self-pay | Admitting: Student

## 2022-07-03 NOTE — Telephone Encounter (Signed)
Rec'd return call from patient's daughter [name], and we discussed the Palliative referral/services and she was in agreement with this.  I have scheduled an In-home Consult for 07/11/22 @ 12 Noon.

## 2022-07-03 NOTE — Telephone Encounter (Signed)
Attempted to contact daughter, [name], to offer to schedule a Palliative Consult, no answer - left message with reason for call along with my name and call back number - requesting a return call.

## 2022-07-04 LAB — CULTURE, BLOOD (ROUTINE X 2)
Culture: NO GROWTH
Culture: NO GROWTH

## 2022-07-10 ENCOUNTER — Other Ambulatory Visit: Payer: Self-pay | Admitting: Student

## 2022-07-10 DIAGNOSIS — I679 Cerebrovascular disease, unspecified: Secondary | ICD-10-CM

## 2022-07-10 DIAGNOSIS — Z515 Encounter for palliative care: Secondary | ICD-10-CM

## 2022-07-10 DIAGNOSIS — R52 Pain, unspecified: Secondary | ICD-10-CM

## 2022-07-10 DIAGNOSIS — R634 Abnormal weight loss: Secondary | ICD-10-CM

## 2022-07-10 DIAGNOSIS — F0394 Unspecified dementia, unspecified severity, with anxiety: Secondary | ICD-10-CM

## 2022-07-10 NOTE — Progress Notes (Signed)
[facility] Palliative Care Consult Note Telephone: [phone]  Fax: [phone]   Date of encounter: 07/10/22 9:59 AM PATIENT NAME: Michele [name] [address] [place] [address]   [phone] (home)  DOB: 1924/09/21 MRN: [id number] PRIMARY CARE PROVIDER:    Baxter Hire, MD,  [place] [address] [phone]  REFERRING PROVIDER:   Baxter Hire, MD [place],  [place] [address] [phone]  RESPONSIBLE PARTY:    Contact Information     Name Relation Home Work Mobile   Rayfield,Gayle Daughter [phone]     clark,chris Yolanda Bonine   [phone]   burton,jana Granddaughter   [phone]   Clark,Margie Relative   [phone]        I met face to face with patient and family in the home. Palliative Care was asked to follow this patient by consultation request of  Baxter Hire, MD to address advance care planning and complex medical decision making. This is the initial visit.                                     ASSESSMENT AND PLAN / RECOMMENDATIONS:   Advance Care Planning/Goals of Care: Goals include to maximize quality of life and symptom management. Patient/health care surrogate gave his/her permission to discuss.Our advance care planning conversation included a discussion about:    The value and importance of advance care planning  Experiences with loved ones who have been seriously ill or have died  Exploration of personal, cultural or spiritual beliefs that might influence medical decisions  Exploration of goals of care in the event of a sudden injury or illness  CODE STATUS: DNR  Education provided on Palliative Medicine vs. Hospice services. Daughter would like for her to remain at [facility]. PCP notified with request for hospice evaluation.   Symptom Management/Plan:  Cerebrovascular disease- patient with hx of CVA, hypertension, atrial fibrillation. She  has generalized weakness; endorses increased weakness and fatigue recently. Requiring more assistance with adl's. She has 24/7 sitters in the home. Continue aspirin as directed. Her Plavix and Eliquis were recently discontinue due to acute on chronic anemia. We discussed having caregivers that are also HHA's to assist with adl care needs.   Dementia- likely vascular. Patient with increased confusion, forgetfulness. Caregivers to continue reorienting and redirecting as needed. Continue aspirin. Monitor for falls/safety. Use rollator walker for ambulation. Patient does exhibit anxiety; continue Buspar, sertraline and PRN alprazolam as directed.   Weight loss-patient with 20 pound loss in past year per family. Encourage foods patient enjoys, nutritional supplements BID. Last weight 100 pounds.   Pain-patient with chronic, arthritic pain. Continue Voltaren gel and norco as directed.   Follow up Palliative Care Visit: Palliative care will continue to follow for complex medical decision making, advance care planning, and clarification of goals. Return prn.  I spent 75 minutes providing this consultation. More than 50% of the time in this consultation was spent in counseling and care coordination.   PPS: 40%  HOSPICE ELIGIBILITY/DIAGNOSIS: Abnormal weight loss, cerebrovascular disease.  Chief Complaint: Palliative Medicine initial consult.   HISTORY OF PRESENT ILLNESS:  Michele [name] is a 88 y.o. year old female  with cerebrovascular disease, hx of CVA, dementia, arthritis, paroxysmal atrial fibrillation, hypertension, hyperlipidemia, mild aortic stenosis, senile purpura, depression, abdominal pain, FTT, right adnexal mass. Patient with hospitalizations  826-8/28, 4/13-4/14/23. ED visits on 8/14 and 06/19/22.   Patient with increased weakness, difficulty ambulating. Has increased difficulty getting to appointments. Daughter reported increased confusion, forgetfulness and is needing more direction. She is  speaking less. Worsening anxiety. Patient is not eating well. Eating about 30% of meals. She is drinking 2 nutritional supplements a day. 20 pound loss in the past 6 months. Some swallowing difficulty reported. Most recent fall 3 weeks ago. 1 person assist to transfer. Speaking less. "Severe UTI's"; about once a month. HPI and ROS primarily obtained from daughter and caregivers.   History obtained from review of EMR, discussion with primary team, and interview with family, facility staff/caregiver and/or Michele [name].  I reviewed available labs, medications, imaging, studies and related documents from the EMR.  Records reviewed and summarized above.    Physical Exam: Pulse 82, resp 16, b/p 122/60 Constitutional: NAD General: frail appearing, thin EYES: anicteric sclera, lids intact, no discharge  ENMT: intact hearing, oral mucous membranes moist, dentition intact CV: S1S2, RRR, 2+ LE edema Pulmonary: LCTA, no increased work of breathing, no cough, room air Abdomen: normo-active BS + 4 quadrants, soft and non tender, no ascites GU: deferred MSK: + sarcopenia, ambulatory Skin: warm and dry, no rashes or wounds on visible skin Neuro:  + generalized weakness, A & O to person, place, familiars Psych: non-anxious affect, pleasant Hem/lymph/immuno: no widespread bruising CURRENT PROBLEM LIST:  Patient Active Problem List   Diagnosis Date Noted   Colitis    Abdominal pain 06/29/2022   AKI (acute kidney injury) ([place]) 06/29/2022   Diarrhea 06/29/2022   PAF (paroxysmal atrial fibrillation) ([place]) 06/29/2022   Failure to thrive in adult 06/29/2022   Leukocytosis 06/29/2022   CVA (cerebral vascular accident) ([place]) 02/14/2022   Depression 02/14/2022   Senile purpura ([place]) 10/14/2019   Nail, injury by 08/30/2019   Foot ulcer ([place]) 08/30/2019   Mild aortic valve stenosis 05/03/2019   Arthritis of both knees 03/31/2019   Chronic CHF ([place]) 03/31/2019   Myocardial infarction ([place]) 03/31/2019    Osteoporosis, 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/31/2019   Rheumatoid arthritis, unspecified ([place]) 03/31/2019   Situational anxiety 03/31/2019   Essential hypertension, benign 08/13/2018   Age related osteoporosis 07/13/2018   Closed subcapital fracture of neck of right femur, initial encounter ([place]) 07/08/2018   Bradycardia 12/29/2017   Pneumonia 11/06/2017   Hyperlipidemia, mixed 06/30/2017   Cerebral aneurysm without rupture 06/23/2017   Bundle branch block, left 12/19/2016   Vitamin D deficiency 12/19/2016   PSVT (paroxysmal supraventricular tachycardia) ([place]) 11/25/2016   B12 deficiency 06/19/2016   H/O: CVA (cerebrovascular accident) 03/01/2016   Spondylosis of cervical region without myelopathy or radiculopathy 09/13/2015   Primary osteoarthritis of both knees 03/14/2015   Arthropathy, lower leg 10/16/2014   DDD (degenerative disc disease), lumbar 04/11/2014   Lumbar radiculitis 04/11/2014   PAST MEDICAL HISTORY:  Active Ambulatory Problems    Diagnosis Date Noted   Pneumonia 11/06/2017   Closed subcapital fracture of neck of right femur, initial encounter ([place]) 07/08/2018   Essential hypertension, benign 08/13/2018   Age related osteoporosis 07/13/2018   Arthritis of both knees 03/31/2019   Arthropathy, lower leg 10/16/2014   B12 deficiency 06/19/2016   Bradycardia 12/29/2017   Bundle branch block, left 12/19/2016   Cerebral aneurysm without rupture 06/23/2017   Chronic CHF ([place]) 03/31/2019   DDD (degenerative disc disease), lumbar 04/11/2014   H/O: CVA (cerebrovascular accident) 03/01/2016   Hyperlipidemia, mixed 06/30/2017   Lumbar radiculitis 04/11/2014   Myocardial infarction ([place])  03/31/2019   Osteoporosis, 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/31/2019   Primary osteoarthritis of both knees 03/14/2015   PSVT (paroxysmal supraventricular tachycardia) ([place]) 11/25/2016   Rheumatoid arthritis, unspecified ([place]) 03/31/2019   Situational anxiety 03/31/2019   Spondylosis of cervical region without  myelopathy or radiculopathy 09/13/2015   Vitamin D deficiency 12/19/2016   Nail, injury by 08/30/2019   Foot ulcer ([place]) 08/30/2019   Mild aortic valve stenosis 05/03/2019   Senile purpura ([place]) 10/14/2019   CVA (cerebral vascular accident) ([place]) 02/14/2022   Depression 02/14/2022   Abdominal pain 06/29/2022   AKI (acute kidney injury) ([place]) 06/29/2022   Diarrhea 06/29/2022   PAF (paroxysmal atrial fibrillation) ([place]) 06/29/2022   Failure to thrive in adult 06/29/2022   Leukocytosis 06/29/2022   Colitis    Resolved Ambulatory Problems    Diagnosis Date Noted   No Resolved Ambulatory Problems   Past Medical History:  Diagnosis Date   Aneurysm ([place])    LBBB (left bundle branch block)    MI (myocardial infarction) ([place])    Stroke ([place])    SOCIAL HX:  Social History   Tobacco Use   Smoking status: Never   Smokeless tobacco: Never  Substance Use Topics   Alcohol use: No   FAMILY HX: No family history on file.    ALLERGIES:  Allergies  Allergen Reactions   Penicillins Rash    Has patient had a PCN reaction causing immediate rash, facial/tongue/throat swelling, SOB or lightheadedness with hypotension: Yes Has patient had a PCN reaction causing severe rash involving mucus membranes or skin necrosis: No Has patient had a PCN reaction that required hospitalization: No Has patient had a PCN reaction occurring within the last 10 years: No If all of the above answers are "NO", then may proceed with Cephalosporin use.     PERTINENT MEDICATIONS:  Outpatient Encounter Medications as of 07/10/2022  Medication Sig   ALPRAZolam (XANAX) 0.25 MG tablet Take 0.25 mg by mouth 2 (two) times daily as needed.   aspirin 81 MG chewable tablet Chew 1 tablet (81 mg total) by mouth daily.   busPIRone (BUSPAR) 10 MG tablet Take 10 mg by mouth 2 (two) times daily.   diclofenac sodium (VOLTAREN) 1 % GEL Apply 4 g topically 4 (four) times daily as needed (pain).   HYDROcodone-acetaminophen  (NORCO/VICODIN) 5-325 MG tablet Take 1 tablet by mouth 4 (four) times daily.   metoprolol succinate (TOPROL-XL) 25 MG 24 hr tablet Take 25 mg by mouth daily.   QUEtiapine (SEROQUEL) 50 MG tablet Take 25 mg by mouth at bedtime.   ramipril (ALTACE) 5 MG capsule Take 5 mg by mouth daily.   sertraline (ZOLOFT) 25 MG tablet Take 25 mg by mouth at bedtime.   spironolactone (ALDACTONE) 25 MG tablet Take 12.5 mg by mouth daily.   No facility-administered encounter medications on file as of 07/10/2022.   Thank you for the opportunity to participate in the care of Michele [name].  The palliative care team will continue to follow. Please call our office at [phone] if we can be of additional assistance.   Ezekiel Slocumb, NP   COVID-19 PATIENT SCREENING TOOL Asked and negative response unless otherwise noted:  Have you had symptoms of covid, tested positive or been in contact with someone with symptoms/positive test in the past 5-10 days? No

## 2022-07-11 ENCOUNTER — Other Ambulatory Visit: Payer: Self-pay | Admitting: Student

## 2022-12-03 ENCOUNTER — Other Ambulatory Visit: Payer: Self-pay

## 2022-12-03 ENCOUNTER — Emergency Department

## 2022-12-03 ENCOUNTER — Inpatient Hospital Stay
Admission: EM | Admit: 2022-12-03 | Discharge: 2022-12-07 | DRG: 481 | Disposition: A | Discharge status: Hospice | Attending: Obstetrics and Gynecology | Admitting: Obstetrics and Gynecology

## 2022-12-03 ENCOUNTER — Encounter: Payer: Self-pay | Admitting: *Deleted

## 2022-12-03 DIAGNOSIS — I350 Nonrheumatic aortic (valve) stenosis: Secondary | ICD-10-CM | POA: Diagnosis present

## 2022-12-03 DIAGNOSIS — I447 Left bundle-branch block, unspecified: Secondary | ICD-10-CM | POA: Diagnosis present

## 2022-12-03 DIAGNOSIS — I509 Heart failure, unspecified: Secondary | ICD-10-CM | POA: Diagnosis present

## 2022-12-03 DIAGNOSIS — Z8673 Personal history of transient ischemic attack (TIA), and cerebral infarction without residual deficits: Secondary | ICD-10-CM

## 2022-12-03 DIAGNOSIS — F32A Depression, unspecified: Secondary | ICD-10-CM | POA: Diagnosis present

## 2022-12-03 DIAGNOSIS — J9811 Atelectasis: Secondary | ICD-10-CM | POA: Diagnosis present

## 2022-12-03 DIAGNOSIS — D72829 Elevated white blood cell count, unspecified: Secondary | ICD-10-CM | POA: Diagnosis present

## 2022-12-03 DIAGNOSIS — F419 Anxiety disorder, unspecified: Secondary | ICD-10-CM | POA: Diagnosis present

## 2022-12-03 DIAGNOSIS — I252 Old myocardial infarction: Secondary | ICD-10-CM | POA: Diagnosis not present

## 2022-12-03 DIAGNOSIS — Z515 Encounter for palliative care: Secondary | ICD-10-CM

## 2022-12-03 DIAGNOSIS — I480 Paroxysmal atrial fibrillation: Secondary | ICD-10-CM | POA: Diagnosis present

## 2022-12-03 DIAGNOSIS — Y92009 Unspecified place in unspecified non-institutional (private) residence as the place of occurrence of the external cause: Secondary | ICD-10-CM | POA: Diagnosis not present

## 2022-12-03 DIAGNOSIS — N1831 Chronic kidney disease, stage 3a: Secondary | ICD-10-CM | POA: Diagnosis present

## 2022-12-03 DIAGNOSIS — Z90710 Acquired absence of both cervix and uterus: Secondary | ICD-10-CM

## 2022-12-03 DIAGNOSIS — K449 Diaphragmatic hernia without obstruction or gangrene: Secondary | ICD-10-CM | POA: Diagnosis present

## 2022-12-03 DIAGNOSIS — Z79899 Other long term (current) drug therapy: Secondary | ICD-10-CM

## 2022-12-03 DIAGNOSIS — Y9301 Activity, walking, marching and hiking: Secondary | ICD-10-CM | POA: Diagnosis present

## 2022-12-03 DIAGNOSIS — E871 Hypo-osmolality and hyponatremia: Secondary | ICD-10-CM | POA: Diagnosis present

## 2022-12-03 DIAGNOSIS — N183 Chronic kidney disease, stage 3 unspecified: Secondary | ICD-10-CM | POA: Diagnosis present

## 2022-12-03 DIAGNOSIS — D631 Anemia in chronic kidney disease: Secondary | ICD-10-CM | POA: Diagnosis present

## 2022-12-03 DIAGNOSIS — I959 Hypotension, unspecified: Secondary | ICD-10-CM | POA: Diagnosis not present

## 2022-12-03 DIAGNOSIS — I130 Hypertensive heart and chronic kidney disease with heart failure and stage 1 through stage 4 chronic kidney disease, or unspecified chronic kidney disease: Secondary | ICD-10-CM | POA: Diagnosis present

## 2022-12-03 DIAGNOSIS — G301 Alzheimer's disease with late onset: Secondary | ICD-10-CM | POA: Diagnosis present

## 2022-12-03 DIAGNOSIS — Z66 Do not resuscitate: Secondary | ICD-10-CM | POA: Diagnosis present

## 2022-12-03 DIAGNOSIS — I671 Cerebral aneurysm, nonruptured: Secondary | ICD-10-CM | POA: Diagnosis present

## 2022-12-03 DIAGNOSIS — W010XXA Fall on same level from slipping, tripping and stumbling without subsequent striking against object, initial encounter: Secondary | ICD-10-CM | POA: Diagnosis present

## 2022-12-03 DIAGNOSIS — M810 Age-related osteoporosis without current pathological fracture: Secondary | ICD-10-CM | POA: Diagnosis present

## 2022-12-03 DIAGNOSIS — I10 Essential (primary) hypertension: Secondary | ICD-10-CM | POA: Diagnosis present

## 2022-12-03 DIAGNOSIS — Z880 Allergy status to penicillin: Secondary | ICD-10-CM

## 2022-12-03 DIAGNOSIS — S72142A Displaced intertrochanteric fracture of left femur, initial encounter for closed fracture: Principal | ICD-10-CM | POA: Diagnosis present

## 2022-12-03 DIAGNOSIS — S72002A Fracture of unspecified part of neck of left femur, initial encounter for closed fracture: Secondary | ICD-10-CM | POA: Diagnosis present

## 2022-12-03 DIAGNOSIS — K5790 Diverticulosis of intestine, part unspecified, without perforation or abscess without bleeding: Secondary | ICD-10-CM | POA: Diagnosis present

## 2022-12-03 DIAGNOSIS — R300 Dysuria: Secondary | ICD-10-CM | POA: Diagnosis present

## 2022-12-03 DIAGNOSIS — Z681 Body mass index (BMI) 19 or less, adult: Secondary | ICD-10-CM | POA: Diagnosis not present

## 2022-12-03 DIAGNOSIS — Z8781 Personal history of (healed) traumatic fracture: Secondary | ICD-10-CM

## 2022-12-03 DIAGNOSIS — Z1152 Encounter for screening for COVID-19: Secondary | ICD-10-CM

## 2022-12-03 DIAGNOSIS — R339 Retention of urine, unspecified: Secondary | ICD-10-CM | POA: Diagnosis not present

## 2022-12-03 DIAGNOSIS — F02B0 Dementia in other diseases classified elsewhere, moderate, without behavioral disturbance, psychotic disturbance, mood disturbance, and anxiety: Secondary | ICD-10-CM | POA: Diagnosis present

## 2022-12-03 DIAGNOSIS — Z7982 Long term (current) use of aspirin: Secondary | ICD-10-CM

## 2022-12-03 DIAGNOSIS — R636 Underweight: Secondary | ICD-10-CM | POA: Diagnosis present

## 2022-12-03 LAB — RESP PANEL BY RT-PCR (RSV, FLU A&B, COVID)  RVPGX2
Influenza A by PCR: NEGATIVE
Influenza B by PCR: NEGATIVE
Resp Syncytial Virus by PCR: NEGATIVE
SARS Coronavirus 2 by RT PCR: NEGATIVE

## 2022-12-03 LAB — BASIC METABOLIC PANEL
Anion gap: 9 (ref 5–15)
BUN: 16 mg/dL (ref 8–23)
CO2: 21 mmol/L — ABNORMAL LOW (ref 22–32)
Calcium: 8.4 mg/dL — ABNORMAL LOW (ref 8.9–10.3)
Chloride: 98 mmol/L (ref 98–111)
Creatinine, Ser: 0.91 mg/dL (ref 0.44–1.00)
GFR, Estimated: 58 mL/min — ABNORMAL LOW (ref >60)
Glucose, Bld: 110 mg/dL — ABNORMAL HIGH (ref 70–99)
Potassium: 4.3 mmol/L (ref 3.5–5.1)
Sodium: 128 mmol/L — ABNORMAL LOW (ref 135–145)

## 2022-12-03 LAB — COMPREHENSIVE METABOLIC PANEL
ALT: 12 U/L (ref 0–44)
AST: 19 U/L (ref 15–41)
Albumin: 3.3 g/dL — ABNORMAL LOW (ref 3.5–5.0)
Alkaline Phosphatase: 72 U/L (ref 38–126)
Anion gap: 9 (ref 5–15)
BUN: 18 mg/dL (ref 8–23)
CO2: 22 mmol/L (ref 22–32)
Calcium: 8.7 mg/dL — ABNORMAL LOW (ref 8.9–10.3)
Chloride: 96 mmol/L — ABNORMAL LOW (ref 98–111)
Creatinine, Ser: 0.93 mg/dL (ref 0.44–1.00)
GFR, Estimated: 56 mL/min — ABNORMAL LOW (ref >60)
Glucose, Bld: 117 mg/dL — ABNORMAL HIGH (ref 70–99)
Potassium: 4.4 mmol/L (ref 3.5–5.1)
Sodium: 127 mmol/L — ABNORMAL LOW (ref 135–145)
Total Bilirubin: 0.5 mg/dL (ref 0.3–1.2)
Total Protein: 6 g/dL — ABNORMAL LOW (ref 6.5–8.1)

## 2022-12-03 LAB — URINALYSIS, ROUTINE W REFLEX MICROSCOPIC
Bacteria, UA: NONE SEEN
Bilirubin Urine: NEGATIVE
Glucose, UA: NEGATIVE mg/dL
Hgb urine dipstick: NEGATIVE
Ketones, ur: NEGATIVE mg/dL
Nitrite: NEGATIVE
Protein, ur: NEGATIVE mg/dL
Specific Gravity, Urine: 1.01 (ref 1.005–1.030)
pH: 7 (ref 5.0–8.0)

## 2022-12-03 LAB — CBC WITH DIFFERENTIAL/PLATELET
Abs Immature Granulocytes: 0.13 10*3/uL — ABNORMAL HIGH (ref 0.00–0.07)
Basophils Absolute: 0.1 10*3/uL (ref 0.0–0.1)
Basophils Relative: 0 %
Eosinophils Absolute: 0.2 10*3/uL (ref 0.0–0.5)
Eosinophils Relative: 1 %
HCT: 31.4 % — ABNORMAL LOW (ref 36.0–46.0)
Hemoglobin: 10 g/dL — ABNORMAL LOW (ref 12.0–15.0)
Immature Granulocytes: 1 %
Lymphocytes Relative: 5 %
Lymphs Abs: 0.9 10*3/uL (ref 0.7–4.0)
MCH: 27.1 pg (ref 26.0–34.0)
MCHC: 31.8 g/dL (ref 30.0–36.0)
MCV: 85.1 fL (ref 80.0–100.0)
Monocytes Absolute: 1.1 10*3/uL — ABNORMAL HIGH (ref 0.1–1.0)
Monocytes Relative: 6 %
Neutro Abs: 15.8 10*3/uL — ABNORMAL HIGH (ref 1.7–7.7)
Neutrophils Relative %: 87 %
Platelets: 322 10*3/uL (ref 150–400)
RBC: 3.69 MIL/uL — ABNORMAL LOW (ref 3.87–5.11)
RDW: 15.9 % — ABNORMAL HIGH (ref 11.5–15.5)
WBC: 18.2 10*3/uL — ABNORMAL HIGH (ref 4.0–10.5)
nRBC: 0 % (ref 0.0–0.2)

## 2022-12-03 LAB — PROTIME-INR
INR: 1.1 (ref 0.8–1.2)
Prothrombin Time: 14.1 seconds (ref 11.4–15.2)

## 2022-12-03 LAB — PROCALCITONIN: Procalcitonin: <0.1 ng/mL

## 2022-12-03 MED ORDER — SODIUM CHLORIDE 0.9 % IV BOLUS
1000.0000 mL | Freq: Once | INTRAVENOUS | Status: AC
  Administered 2022-12-03: 1000 mL via INTRAVENOUS

## 2022-12-03 MED ORDER — MORPHINE SULFATE (PF) 2 MG/ML IV SOLN
0.5000 mg | INTRAVENOUS | Status: DC | PRN
  Administered 2022-12-03 – 2022-12-04 (×2): 0.5 mg via INTRAVENOUS
  Filled 2022-12-03 (×2): qty 1

## 2022-12-03 MED ORDER — MORPHINE SULFATE (PF) 2 MG/ML IV SOLN
2.0000 mg | Freq: Once | INTRAVENOUS | Status: AC
  Administered 2022-12-03: 2 mg via INTRAVENOUS
  Filled 2022-12-03: qty 1

## 2022-12-03 MED ORDER — SENNOSIDES-DOCUSATE SODIUM 8.6-50 MG PO TABS: 1.0000 | ORAL_TABLET | Freq: Every evening | ORAL | Status: DC | PRN

## 2022-12-03 MED ORDER — HYDROCODONE-ACETAMINOPHEN 5-325 MG PO TABS
1.0000 | ORAL_TABLET | Freq: Four times a day (QID) | ORAL | Status: DC | PRN
  Administered 2022-12-04 – 2022-12-07 (×5): 1 via ORAL
  Filled 2022-12-03 (×6): qty 1

## 2022-12-03 NOTE — ED Notes (Signed)
Pts CNA caregiver is at the bedside at this time. The granddaughter went home earlier.

## 2022-12-03 NOTE — H&P (Signed)
History and Physical    Michele [name] JEH:[id number] DOB: Dec 07, 1923 DOA: 12/03/2022  PCP: Baxter Hire, MD  Patient coming from:  [facility]  I have personally briefly reviewed patient's old medical records in [place]  Chief Complaint: left hip pain s/p fall   HPI: Michele [name] is a 89 y.o. female with medical history significant of  89 year old female with history of right parietal occipital infarct, anxiety, PAF of AC due to chronic anemia , Anemia , memory deficit,  depression, hypertension, large hiatal hernia, diverticulosis, who presents to ED s/p slip and fall on wet surface while walking with walker at [facility]. Patient currently states she has no pain. She notes no sob/chest pain / n/v/diarrhea, but appears to have complaint of mild dysuria.  She is not able to give full history of event. Event details taken from chart.   ED Course:  Afeb, bp 151/92, rr 18 ,sat 985 on ra   Xray: Comminuted and displaced intertrochanteric left femur fracture.    cxr No evidence of acute chest injury. Chronic left basilar atelectasis as seen on recent CT.   Cthead IMPRESSION: 1. No acute intracranial process. 2. Since 06/29/2022, interval development of chronic appearing infarcts in the left occipital lobe and bilateral superior cerebellar hemispheres. Res panel:neg  Wbc: 18.2, hgb 10( at base ) ,  plt 332,  pmn 15.6 (H) left shit  NA 127(134), K 4.4, cr0.93, glu 117 Procal <0.10  EKG  Sinus, PAC LBBB known  Tx morphine, ns 1L Review of Systems: As per HPI otherwise 10 point review of systems negative.   Past Medical History:  Diagnosis Date   Aneurysm ([place])    LBBB (left bundle branch block)    MI (myocardial infarction) ([place])    Stroke [facility])     Past Surgical History:  Procedure Laterality Date   ABDOMINAL HYSTERECTOMY     APPENDECTOMY     HIP PINNING,CANNULATED Right 07/08/2018   Procedure: CANNULATED HIP PINNING;  Surgeon: Leim Fabry, MD;  Location: [facility] ORS;   Service: Orthopedics;  Laterality: Right;   ROTATOR CUFF REPAIR Left      reports that she has never smoked. She has never used smokeless tobacco. She reports that she does not drink alcohol and does not use drugs.  Allergies  Allergen Reactions   Penicillins Rash    Has patient had a PCN reaction causing immediate rash, facial/tongue/throat swelling, SOB or lightheadedness with hypotension: Yes Has patient had a PCN reaction causing severe rash involving mucus membranes or skin necrosis: No Has patient had a PCN reaction that required hospitalization: No Has patient had a PCN reaction occurring within the last 10 years: No If all of the above answers are "NO", then may proceed with Cephalosporin use.    History reviewed. No pertinent family history.  Prior to Admission medications   Medication Sig Start Date End Date Taking? Authorizing Provider  ALPRAZolam (XANAX) 0.25 MG tablet Take 0.25 mg by mouth 2 (two) times daily as needed. 05/23/20   [provider]  aspirin 81 MG chewable tablet Chew 1 tablet (81 mg total) by mouth daily. 02/15/22   Lorella Nimrod, MD  busPIRone (BUSPAR) 10 MG tablet Take 10 mg by mouth 2 (two) times daily. 06/26/20   [provider]  diclofenac sodium (VOLTAREN) 1 % GEL Apply 4 g topically 4 (four) times daily as needed (pain).    [provider]  HYDROcodone-acetaminophen (NORCO/VICODIN) 5-325 MG tablet Take 1 tablet by mouth  4 (four) times daily. 08/29/18   [provider]  metoprolol succinate (TOPROL-XL) 25 MG 24 hr tablet Take 25 mg by mouth daily. 08/16/20   [provider]  QUEtiapine (SEROQUEL) 50 MG tablet Take 25 mg by mouth at bedtime. 08/30/20   [provider]  ramipril (ALTACE) 5 MG capsule Take 5 mg by mouth daily. 07/18/20   [provider]  sertraline (ZOLOFT) 25 MG tablet Take 25 mg by mouth at bedtime. 07/14/20   [provider]  spironolactone (ALDACTONE) 25 MG tablet Take  12.5 mg by mouth daily. 01/01/21   [provider]    Physical Exam: Vitals:   12/03/22 1845 12/03/22 1900 12/03/22 1930 12/03/22 2030  BP:  119/71 132/64 (!) 120/90  Pulse: 69 68 76 99  Resp: '17 17 14 '$ (!) 22  Temp:      TempSrc:      SpO2: 98% 96% 100% 99%  Weight:      Height:        Constitutional: NAD, calm, comfortable Vitals:   12/03/22 1845 12/03/22 1900 12/03/22 1930 12/03/22 2030  BP:  119/71 132/64 (!) 120/90  Pulse: 69 68 76 99  Resp: '17 17 14 '$ (!) 22  Temp:      TempSrc:      SpO2: 98% 96% 100% 99%  Weight:      Height:       Eyes: PERRL, lids and conjunctivae normal ENMT: Mucous membranes are moist. Posterior pharynx clear of any exudate or lesions.Normal dentition.  Neck: normal, supple, no masses, no thyromegaly Respiratory: clear to auscultation bilaterally, no wheezing, no crackles. Normal respiratory effort. No accessory muscle use.  Cardiovascular: Regular rate and rhythm, no murmurs / rubs / gallops. No extremity edema. 2+ pedal pulses.  Abdomen: no tenderness, no masses palpated. No hepatosplenomegaly. Bowel sounds positive.  Musculoskeletal: no clubbing / cyanosis. Left lower ext shorter than right no contractures. Skin: no rashes, lesions, ulcers. No induration Neurologic: CN 2-12 grossly intact. Sensation intact,. MAE x 3 except Left lower extremity Psychiatric: Normal judgment and insight. Alert and oriented x 3. Normal mood.    Labs on Admission: I have personally reviewed following labs and imaging studies  CBC: Recent Labs  Lab 12/03/22 1818  WBC 18.2*  NEUTROABS 15.8*  HGB 10.0*  HCT 31.4*  MCV 85.1  PLT 921   Basic Metabolic Panel: Recent Labs  Lab 12/03/22 1818  NA 127*  K 4.4  CL 96*  CO2 22  GLUCOSE 117*  BUN 18  CREATININE 0.93  CALCIUM 8.7*   GFR: Estimated Creatinine Clearance: 25.1 mL/min (by C-G formula based on SCr of 0.93 mg/dL). Liver Function Tests: Recent Labs  Lab 12/03/22 1818  AST 19  ALT 12   ALKPHOS 72  BILITOT 0.5  PROT 6.0*  ALBUMIN 3.3*   No results for input(s): "LIPASE", "AMYLASE" in the last 168 hours. No results for input(s): "AMMONIA" in the last 168 hours. Coagulation Profile: Recent Labs  Lab 12/03/22 1818  INR 1.1   Cardiac Enzymes: No results for input(s): "CKTOTAL", "CKMB", "CKMBINDEX", "TROPONINI" in the last 168 hours. BNP (last 3 results) No results for input(s): "PROBNP" in the last 8760 hours. HbA1C: No results for input(s): "HGBA1C" in the last 72 hours. CBG: No results for input(s): "GLUCAP" in the last 168 hours. Lipid Profile: No results for input(s): "CHOL", "HDL", "LDLCALC", "TRIG", "CHOLHDL", "LDLDIRECT" in the last 72 hours. Thyroid Function Tests: No results for input(s): "TSH", "T4TOTAL", "FREET4", "T3FREE", "THYROIDAB"  in the last 72 hours. Anemia Panel: No results for input(s): "VITAMINB12", "FOLATE", "FERRITIN", "TIBC", "IRON", "RETICCTPCT" in the last 72 hours. Urine analysis:    Component Value Date/Time   COLORURINE YELLOW 06/29/2022 0836   APPEARANCEUR CLEAR 06/29/2022 0836   LABSPEC 1.015 06/29/2022 0836   PHURINE 5.0 06/29/2022 0836   GLUCOSEU NEGATIVE 06/29/2022 0836   HGBUR NEGATIVE 06/29/2022 0836   BILIRUBINUR MODERATE (A) 06/29/2022 0836   KETONESUR 15 (A) 06/29/2022 0836   PROTEINUR 30 (A) 06/29/2022 0836   NITRITE POSITIVE (A) 06/29/2022 0836   LEUKOCYTESUR TRACE (A) 06/29/2022 0836    Radiological Exams on Admission: CT HEAD WO CONTRAST (5MM)  Result Date: 12/03/2022 CLINICAL DATA:  Fall EXAM: CT HEAD WITHOUT CONTRAST TECHNIQUE: Contiguous axial images were obtained from the base of the skull through the vertex without intravenous contrast. RADIATION DOSE REDUCTION: This exam was performed according to the departmental dose-optimization program which includes automated exposure control, adjustment of the mA and/or kV according to patient size and/or use of iterative reconstruction technique. COMPARISON:   06/29/2022 FINDINGS: Brain: No evidence of acute infarction, hemorrhage, mass, mass effect, or midline shift. No hydrocephalus or extra-axial fluid collection. Redemonstrated remote infarcts in right parieto-occipital region and left greater than right cerebellar hemisphere, with interval development of additional chronic appearing infarcts in left occipital lobe (series 4, image 53) and bilateral more superior cerebellar hemispheres (series 3, image 8 and series 4, image 50-53). Periventricular white matter changes, likely the sequela of chronic small vessel ischemic disease. Vascular: No hyperdense vessel. Atherosclerotic calcifications in the intracranial carotid and vertebral arteries. Skull: Negative for fracture or focal lesion. Sinuses/Orbits: Mucosal thickening in the ethmoid air cells. No acute finding in the orbits. Status post left lens replacement. Other: The mastoid air cells are well aerated. IMPRESSION: 1. No acute intracranial process. 2. Since 06/29/2022, interval development of chronic appearing infarcts in the left occipital lobe and bilateral superior cerebellar hemispheres. Electronically Signed   By: Merilyn Baba M.D.   On: 12/03/2022 18:13   DG Chest 1 View  Result Date: 12/03/2022 CLINICAL DATA:  Fall today.  Left hip fracture. EXAM: CHEST  1 VIEW COMPARISON:  [place] 02/14/2022 and 07/08/2018. CT chest 11/08/2017 and abdomen 06/29/2022. FINDINGS: 1704 hours. Patient is rotated to the left. There are low lung volumes. Stable cardiomegaly, aortic atherosclerosis and a small hiatal hernia. Volume loss at the left lung base and associated atelectasis are similar to recent CT. There are nonacute appearing left rib fractures. Multiple thoracolumbar compression deformities are grossly unchanged. IMPRESSION: No evidence of acute chest injury. Chronic left basilar atelectasis as seen on recent CT. Electronically Signed   By: Richardean Sale M.D.   On: 12/03/2022 17:35   DG Hip Unilat W or Wo  Pelvis 2-3 Views Left  Result Date: 12/03/2022 CLINICAL DATA:  Fall today when ambulating with walker at nursing facility. EXAM: DG HIP (WITH OR WITHOUT PELVIS) 2-3V LEFT COMPARISON:  None Available. FINDINGS: AP pelvis with AP and frog-leg lateral views of the left hip. The bones are diffusely demineralized. There is a comminuted and displaced intertrochanteric left femur fracture. No evidence of dislocation or acute pelvic fracture. Previous right femoral neck pinning noted. There are degenerative changes within the visualized lower lumbar spine. IMPRESSION: Comminuted and displaced intertrochanteric left femur fracture. Electronically Signed   By: Richardean Sale M.D.   On: 12/03/2022 17:32    EKG: Independently reviewed. See above  Assessment/Plan  Acute intertrochanteric Left femur fracture  - admit to  tele  -place on hip protocol  - Dr Marica Otter orthopedic plan on OR in am  -NPO midnight  -supportive pain medication  -PT/OT   Leukocytosis -note left shift  - procalcitonin with normal limits  - concern for infection vs dehydration and stress response  -awaiting UA -cxr: NAD no other signs of infection  - f/u and UA and start abx if needed  -monitor counts s/p ivfs   Hyponatremia , mild  - due to poor po intake low volume while on spironolactone  - hold spironolactone until NA stable  -repeat labs at midnight s/p ivfs  - if still abnormal will f/u with further evaluation  -continue gentle ivfs   CVA  -on secondary ppx with asa  -hold asa due to planned or time  -resume one ok with ortho   Paf -now in sinus  - of AC due to hx of chronic anemia with low hgb requiring transfusion on last admission   Chronic anemia ,normocytic  -hgb stable  - monitor h/h    Anxiety  Depression   Large Hiatal hernia  -no active issues   Diverticulosis  -no active issues   DVT prophylaxis: scd  Code Status: DNR /as discussed per patient/family wishes in event of cardiac arrest   Family Communication: Rayfield,Gayle (Daughter) [phone] (Home Phone)  Disposition Plan: patient  expected to be admitted greater than 2 midnights  Consults called: Ortho pedic s/p [place] Admission status: tele /med   Clance Boll MD [facility]   If 7PM-7AM, please contact night-coverage [email or web address] Password [facility]  12/03/2022, 8:47 PM

## 2022-12-03 NOTE — ED Notes (Signed)
ED Provider at bedside. 

## 2022-12-03 NOTE — ED Triage Notes (Signed)
Pt fell today at [facility].  Pt fell while ambulating with her walker.  Pt has left hip pain.  Pt alert.  Caregiver with pt.

## 2022-12-03 NOTE — ED Provider Notes (Addendum)
[facility] Provider Note    Event Date/Time   First MD Initiated Contact with Patient 12/03/22 1706     (approximate)   History   Fall   HPI  Michele [name] is a 89 y.o. female  with pmh ight parietal occipital infarct, anxiety, PAF not currently anticoagulated, depression, hypertension, who presents with hip pain after a fall.  Patient resides in a nursing facility.  She was walking with her walker in the dining room when she fell onto the left hip.  Tells me the floors were slippery and that is why she fell.  Did not hit her head.  Complains of left hip pain only.  Her granddaughter who is at bedside says that the facility told her that she was having a "rough" day and that she was a little bit more altered than normal however the patient's granddaughter notes that she is at her baseline now.  Patient otherwise denies symptoms prior to the fall denies fevers chills urinary symptoms abdominal pain chest pain or dyspnea or cough.  Patient is not on any anticoagulation currently     Past Medical History:  Diagnosis Date   Aneurysm ([place])    LBBB (left bundle branch block)    MI (myocardial infarction) ([place])    Stroke [facility])     Patient Active Problem List   Diagnosis Date Noted   Colitis    Abdominal pain 06/29/2022   AKI (acute kidney injury) ([place]) 06/29/2022   Diarrhea 06/29/2022   PAF (paroxysmal atrial fibrillation) ([place]) 06/29/2022   Failure to thrive in adult 06/29/2022   Leukocytosis 06/29/2022   CVA (cerebral vascular accident) ([place]) 02/14/2022   Depression 02/14/2022   Senile purpura ([place]) 10/14/2019   Nail, injury by 08/30/2019   Foot ulcer ([place]) 08/30/2019   Mild aortic valve stenosis 05/03/2019   Arthritis of both knees 03/31/2019   Chronic CHF ([place]) 03/31/2019   Myocardial infarction ([place]) 03/31/2019   Osteoporosis, 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/31/2019   Rheumatoid arthritis, unspecified ([place]) 03/31/2019   Situational anxiety 03/31/2019    Essential hypertension, benign 08/13/2018   Age related osteoporosis 07/13/2018   Closed subcapital fracture of neck of right femur, initial encounter ([place]) 07/08/2018   Bradycardia 12/29/2017   Pneumonia 11/06/2017   Hyperlipidemia, mixed 06/30/2017   Cerebral aneurysm without rupture 06/23/2017   Bundle branch block, left 12/19/2016   Vitamin D deficiency 12/19/2016   PSVT (paroxysmal supraventricular tachycardia) 11/25/2016   B12 deficiency 06/19/2016   H/O: CVA (cerebrovascular accident) 03/01/2016   Spondylosis of cervical region without myelopathy or radiculopathy 09/13/2015   Primary osteoarthritis of both knees 03/14/2015   Arthropathy, lower leg 10/16/2014   DDD (degenerative disc disease), lumbar 04/11/2014   Lumbar radiculitis 04/11/2014     Physical Exam  Triage Vital Signs: ED Triage Vitals  Enc Vitals Group     BP 12/03/22 1645 (!) 151/92     Pulse Rate 12/03/22 1645 64     Resp 12/03/22 1645 18     Temp 12/03/22 1645 98 F (36.7 C)     Temp Source 12/03/22 1645 Oral     SpO2 12/03/22 1645 98 %     Weight 12/03/22 1646 99 lb 3.3 oz (45 kg)     Height 12/03/22 1646 '5\' 2"'$  (1.575 m)     Head Circumference --      Peak Flow --      Pain Score 12/03/22 1646 10     Pain Loc --  Pain Edu? --      Excl. in [place]? --     Most recent vital signs: Vitals:   12/03/22 1930 12/03/22 2030  BP: 132/64 (!) 120/90  Pulse: 76 99  Resp: 14 (!) 22  Temp:    SpO2: 100% 99%     General: Awake, no distress.  CV:  Good peripheral perfusion.  Resp:  Normal effort.  Abd:  No distention.  Neuro:             Awake, Alert, Oriented x 3  Other:  Left leg is shortened and externally rotated, tenderness to palpation over the left greater trochanter, able to wiggle the toes, 2+ DP pulse No chest wall tenderness no C-spine tenderness no signs of trauma to the head or face, abdomen is soft nontender   ED Results / Procedures / Treatments  Labs (all labs ordered are listed,  but only abnormal results are displayed) Labs Reviewed  COMPREHENSIVE METABOLIC PANEL - Abnormal; Notable for the following components:      Result Value   Sodium 127 (*)    Chloride 96 (*)    Glucose, Bld 117 (*)    Calcium 8.7 (*)    Total Protein 6.0 (*)    Albumin 3.3 (*)    GFR, Estimated 56 (*)    All other components within normal limits  CBC WITH DIFFERENTIAL/PLATELET - Abnormal; Notable for the following components:   WBC 18.2 (*)    RBC 3.69 (*)    Hemoglobin 10.0 (*)    HCT 31.4 (*)    RDW 15.9 (*)    Neutro Abs 15.8 (*)    Monocytes Absolute 1.1 (*)    Abs Immature Granulocytes 0.13 (*)    All other components within normal limits  RESP PANEL BY RT-PCR (RSV, FLU A&B, COVID)  RVPGX2  PROTIME-INR  PROCALCITONIN  URINALYSIS, ROUTINE W REFLEX MICROSCOPIC  TYPE AND SCREEN     EKG  EKG reviewed interpreted myself shows left bundle branch block sinus rhythm normal axis no Sgarbossa criteria to suggest ischemia   RADIOLOGY  I reviewed and interpreted the left hip x-ray which shows intertrochanteric fracture  PROCEDURES:  Critical Care performed: No  Procedures  The patient is on the cardiac monitor to evaluate for evidence of arrhythmia and/or significant heart rate changes.   MEDICATIONS ORDERED IN ED: Medications  morphine (PF) 2 MG/ML injection 2 mg (2 mg Intravenous Given 12/03/22 1817)  sodium chloride 0.9 % bolus 1,000 mL (0 mLs Intravenous Stopped 12/03/22 2057)     IMPRESSION / MDM / ASSESSMENT AND PLAN / ED COURSE  I reviewed the triage vital signs and the nursing notes.                              Patient's presentation is most consistent with acute complicated illness / injury requiring diagnostic workup.  Differential diagnosis includes, but is not limited to, hip fracture, dislocation, intracranial hemorrhage  Patient is a 89 year old female who presents after a fall with left hip pain.  Occurred while she was walking in the dining room  and slipped because floor was wet.  Fell onto her left hip did not hit her head.  Her only complaint is left hip pain.  On exam the left leg is held in external rotation chin and is shortened she is neurovascular intact.  No other signs of trauma on exam.  X-ray confirms left intertrochanteric hip fracture.  Plan to obtain labs.  She is not anticoagulated but will obtain CT head given her age and the fall to rule out subdural.  I have secure chat message Dr. Harlow Mares with orthopedics.  Patient is DNR has been seen by palliative care but I confirmed with her that she would want to proceed with surgery.  Will give small dose of morphine for pain.   Patient does have leukocytosis to 18.  Will add on procalcitonin urinalysis and COVID swab.  Chest x-ray is clear.  Patient does not have any focal symptoms of infection, but infectious process could have led her to fall today.  Urinalysis still pending.  Procalcitonin is negative.  Leukocytosis could also just be reactive.  I discussed with the hospitalist to admit the patient.     FINAL CLINICAL IMPRESSION(S) / ED DIAGNOSES   Final diagnoses:  Closed fracture of left hip, initial encounter [facility])     Rx / DC Orders   ED Discharge Orders     None        Note:  This document was prepared using Dragon voice recognition software and may include unintentional dictation errors.   Rada Hay, MD 12/03/22 1755    Rada Hay, MD 12/03/22 2059

## 2022-12-03 NOTE — Progress Notes (Signed)
[facility] ED [facility])       This patient is a current hospice patient with [facility], admitted with a terminal diagnosis of Other signs and symptoms involving cognitive functions following cerebral infarction.     [facility] will continue to follow for any discharge planning needs and to coordinate continuation of hospice care.   Please don't hesitate to call with any Hospice related questions or concerns.    Thank you for the opportunity to participate in this patient's care. Jhonnie Garner, Therapist, sports, BSN, [facility] Hospital Liaison [phone]

## 2022-12-04 ENCOUNTER — Inpatient Hospital Stay

## 2022-12-04 ENCOUNTER — Encounter
Admission: EM | Disposition: A | Discharge status: Hospice | Payer: Self-pay | Source: Home / Self Care | Attending: Obstetrics and Gynecology

## 2022-12-04 ENCOUNTER — Encounter: Payer: Self-pay | Admitting: Internal Medicine

## 2022-12-04 ENCOUNTER — Inpatient Hospital Stay: Admitting: General Practice

## 2022-12-04 ENCOUNTER — Other Ambulatory Visit: Payer: Self-pay

## 2022-12-04 DIAGNOSIS — Z8781 Personal history of (healed) traumatic fracture: Secondary | ICD-10-CM | POA: Diagnosis not present

## 2022-12-04 DIAGNOSIS — S72002A Fracture of unspecified part of neck of left femur, initial encounter for closed fracture: Secondary | ICD-10-CM | POA: Diagnosis present

## 2022-12-04 HISTORY — PX: FEMUR IM NAIL: SHX1597

## 2022-12-04 LAB — PREPARE RBC (CROSSMATCH)

## 2022-12-04 LAB — TECHNOLOGIST SMEAR REVIEW: Plt Morphology: NORMAL

## 2022-12-04 LAB — BASIC METABOLIC PANEL
Anion gap: 5 (ref 5–15)
BUN: 15 mg/dL (ref 8–23)
CO2: 23 mmol/L (ref 22–32)
Calcium: 8.1 mg/dL — ABNORMAL LOW (ref 8.9–10.3)
Chloride: 101 mmol/L (ref 98–111)
Creatinine, Ser: 0.73 mg/dL (ref 0.44–1.00)
GFR, Estimated: >60 mL/min (ref >60)
Glucose, Bld: 109 mg/dL — ABNORMAL HIGH (ref 70–99)
Potassium: 4.5 mmol/L (ref 3.5–5.1)
Sodium: 129 mmol/L — ABNORMAL LOW (ref 135–145)

## 2022-12-04 LAB — CBC
HCT: 25.4 % — ABNORMAL LOW (ref 36.0–46.0)
Hemoglobin: 7.9 g/dL — ABNORMAL LOW (ref 12.0–15.0)
MCH: 26.9 pg (ref 26.0–34.0)
MCHC: 31.1 g/dL (ref 30.0–36.0)
MCV: 86.4 fL (ref 80.0–100.0)
Platelets: 241 10*3/uL (ref 150–400)
RBC: 2.94 MIL/uL — ABNORMAL LOW (ref 3.87–5.11)
RDW: 16 % — ABNORMAL HIGH (ref 11.5–15.5)
WBC: 9.2 10*3/uL (ref 4.0–10.5)
nRBC: 0 % (ref 0.0–0.2)

## 2022-12-04 LAB — CBC WITH DIFFERENTIAL/PLATELET
Abs Immature Granulocytes: 0.04 10*3/uL (ref 0.00–0.07)
Basophils Absolute: 0 10*3/uL (ref 0.0–0.1)
Basophils Relative: 0 %
Eosinophils Absolute: 0 10*3/uL (ref 0.0–0.5)
Eosinophils Relative: 0 %
HCT: 25.5 % — ABNORMAL LOW (ref 36.0–46.0)
Hemoglobin: 8 g/dL — ABNORMAL LOW (ref 12.0–15.0)
Immature Granulocytes: 0 %
Lymphocytes Relative: 10 %
Lymphs Abs: 0.9 10*3/uL (ref 0.7–4.0)
MCH: 27.1 pg (ref 26.0–34.0)
MCHC: 31.4 g/dL (ref 30.0–36.0)
MCV: 86.4 fL (ref 80.0–100.0)
Monocytes Absolute: 0.8 10*3/uL (ref 0.1–1.0)
Monocytes Relative: 9 %
Neutro Abs: 7.3 10*3/uL (ref 1.7–7.7)
Neutrophils Relative %: 81 %
Platelets: 264 10*3/uL (ref 150–400)
RBC: 2.95 MIL/uL — ABNORMAL LOW (ref 3.87–5.11)
RDW: 16 % — ABNORMAL HIGH (ref 11.5–15.5)
WBC: 9.2 10*3/uL (ref 4.0–10.5)
nRBC: 0 % (ref 0.0–0.2)

## 2022-12-04 LAB — IRON AND TIBC
Iron: 93 ug/dL (ref 28–170)
Saturation Ratios: 37 % — ABNORMAL HIGH (ref 10.4–31.8)
TIBC: 255 ug/dL (ref 250–450)
UIBC: 162 ug/dL

## 2022-12-04 LAB — SODIUM, URINE, RANDOM: Sodium, Ur: 100 mmol/L

## 2022-12-04 LAB — OSMOLALITY, URINE: Osmolality, Ur: 354 mOsm/kg (ref 300–900)

## 2022-12-04 LAB — FERRITIN: Ferritin: 17 ng/mL (ref 11–307)

## 2022-12-04 LAB — TSH: TSH: 1.613 u[IU]/mL (ref 0.350–4.500)

## 2022-12-04 SURGERY — INSERTION, INTRAMEDULLARY ROD, FEMUR
Anesthesia: General | Site: Hip | Laterality: Left

## 2022-12-04 MED ORDER — ONDANSETRON HCL 4 MG/2ML IJ SOLN
INTRAMUSCULAR | Status: DC | PRN
  Administered 2022-12-04: 2 mg via INTRAVENOUS

## 2022-12-04 MED ORDER — BUPIVACAINE-EPINEPHRINE (PF) 0.5% -1:200000 IJ SOLN
INTRAMUSCULAR | Status: DC | PRN
  Administered 2022-12-04: 30 mL

## 2022-12-04 MED ORDER — CEFAZOLIN SODIUM-DEXTROSE 1-4 GM/50ML-% IV SOLN
1.0000 g | Freq: Four times a day (QID) | INTRAVENOUS | Status: AC
  Administered 2022-12-04 – 2022-12-05 (×2): 1 g via INTRAVENOUS

## 2022-12-04 MED ORDER — SERTRALINE HCL 50 MG PO TABS
ORAL_TABLET | ORAL | Status: AC
  Filled 2022-12-04: qty 1

## 2022-12-04 MED ORDER — 0.9 % SODIUM CHLORIDE (POUR BTL) OPTIME
TOPICAL | Status: DC | PRN
  Administered 2022-12-04: 500 mL

## 2022-12-04 MED ORDER — ACETAMINOPHEN 10 MG/ML IV SOLN
INTRAVENOUS | Status: AC
  Administered 2022-12-04: 675 mg via INTRAVENOUS
  Filled 2022-12-04: qty 100

## 2022-12-04 MED ORDER — ONDANSETRON HCL 4 MG/2ML IJ SOLN: 4.0000 mg | Freq: Four times a day (QID) | INTRAMUSCULAR | Status: DC | PRN

## 2022-12-04 MED ORDER — PHENYLEPHRINE HCL-NACL 20-0.9 MG/250ML-% IV SOLN
INTRAVENOUS | Status: DC | PRN
  Administered 2022-12-04: 10 ug/min via INTRAVENOUS

## 2022-12-04 MED ORDER — ALPRAZOLAM 0.25 MG PO TABS: 0.2500 mg | ORAL_TABLET | Freq: Two times a day (BID) | ORAL | Status: DC | PRN

## 2022-12-04 MED ORDER — ONDANSETRON HCL 4 MG/2ML IJ SOLN: 4.0000 mg | Freq: Once | INTRAMUSCULAR | Status: DC | PRN

## 2022-12-04 MED ORDER — FENTANYL CITRATE (PF) 100 MCG/2ML IJ SOLN
25.0000 ug | INTRAMUSCULAR | Status: DC | PRN
  Administered 2022-12-04: 25 ug via INTRAVENOUS
  Administered 2022-12-04: 50 ug via INTRAVENOUS

## 2022-12-04 MED ORDER — SERTRALINE HCL 50 MG PO TABS
25.0000 mg | ORAL_TABLET | Freq: Every day | ORAL | Status: DC
  Administered 2022-12-04 – 2022-12-06 (×3): 25 mg via ORAL
  Filled 2022-12-04 (×2): qty 1

## 2022-12-04 MED ORDER — LACTATED RINGERS IV SOLN: INTRAVENOUS | Status: DC

## 2022-12-04 MED ORDER — CEFAZOLIN SODIUM-DEXTROSE 1-4 GM/50ML-% IV SOLN
INTRAVENOUS | Status: AC
  Filled 2022-12-04: qty 50

## 2022-12-04 MED ORDER — TRANEXAMIC ACID-NACL 1000-0.7 MG/100ML-% IV SOLN
INTRAVENOUS | Status: AC
  Filled 2022-12-04: qty 100

## 2022-12-04 MED ORDER — DOCUSATE SODIUM 100 MG PO CAPS
100.0000 mg | ORAL_CAPSULE | Freq: Two times a day (BID) | ORAL | Status: DC
  Administered 2022-12-05 – 2022-12-07 (×4): 100 mg via ORAL
  Filled 2022-12-04 (×4): qty 1

## 2022-12-04 MED ORDER — DIPHENHYDRAMINE HCL 12.5 MG/5ML PO ELIX: 12.5000 mg | ORAL_SOLUTION | ORAL | Status: DC | PRN

## 2022-12-04 MED ORDER — FENTANYL CITRATE (PF) 100 MCG/2ML IJ SOLN
INTRAMUSCULAR | Status: DC | PRN
  Administered 2022-12-04 (×2): 25 ug via INTRAVENOUS
  Administered 2022-12-04: 50 ug via INTRAVENOUS

## 2022-12-04 MED ORDER — CEFAZOLIN SODIUM-DEXTROSE 2-4 GM/100ML-% IV SOLN
2.0000 g | INTRAVENOUS | Status: AC
  Administered 2022-12-04: 2 g via INTRAVENOUS

## 2022-12-04 MED ORDER — OXYCODONE HCL 5 MG PO TABS: 5.0000 mg | ORAL_TABLET | Freq: Once | ORAL | Status: DC | PRN

## 2022-12-04 MED ORDER — ROCURONIUM BROMIDE 100 MG/10ML IV SOLN
INTRAVENOUS | Status: DC | PRN
  Administered 2022-12-04: 15 mg via INTRAVENOUS
  Administered 2022-12-04: 25 mg via INTRAVENOUS

## 2022-12-04 MED ORDER — FENTANYL CITRATE (PF) 100 MCG/2ML IJ SOLN
INTRAMUSCULAR | Status: AC
  Filled 2022-12-04: qty 2

## 2022-12-04 MED ORDER — LIDOCAINE HCL (CARDIAC) PF 100 MG/5ML IV SOSY
PREFILLED_SYRINGE | INTRAVENOUS | Status: DC | PRN
  Administered 2022-12-04: 30 mg via INTRAVENOUS

## 2022-12-04 MED ORDER — SODIUM CHLORIDE 0.9% IV SOLUTION: Freq: Once | INTRAVENOUS | Status: DC

## 2022-12-04 MED ORDER — METHOCARBAMOL 500 MG PO TABS
500.0000 mg | ORAL_TABLET | Freq: Four times a day (QID) | ORAL | Status: DC | PRN
  Administered 2022-12-06: 500 mg via ORAL
  Filled 2022-12-04: qty 1

## 2022-12-04 MED ORDER — ONDANSETRON HCL 4 MG PO TABS: 4.0000 mg | ORAL_TABLET | Freq: Four times a day (QID) | ORAL | Status: DC | PRN

## 2022-12-04 MED ORDER — BUPIVACAINE-EPINEPHRINE (PF) 0.5% -1:200000 IJ SOLN
INTRAMUSCULAR | Status: AC
  Filled 2022-12-04: qty 30

## 2022-12-04 MED ORDER — SUGAMMADEX SODIUM 200 MG/2ML IV SOLN
INTRAVENOUS | Status: DC | PRN
  Administered 2022-12-04: 200 mg via INTRAVENOUS

## 2022-12-04 MED ORDER — METOCLOPRAMIDE HCL 10 MG PO TABS: 5.0000 mg | ORAL_TABLET | Freq: Three times a day (TID) | ORAL | Status: DC | PRN

## 2022-12-04 MED ORDER — CEFAZOLIN SODIUM-DEXTROSE 2-4 GM/100ML-% IV SOLN
INTRAVENOUS | Status: AC
  Filled 2022-12-04: qty 100

## 2022-12-04 MED ORDER — QUETIAPINE FUMARATE 25 MG PO TABS
25.0000 mg | ORAL_TABLET | Freq: Every day | ORAL | Status: DC
  Administered 2022-12-04 – 2022-12-06 (×3): 25 mg via ORAL
  Filled 2022-12-04 (×3): qty 1

## 2022-12-04 MED ORDER — ZOLPIDEM TARTRATE 5 MG PO TABS: 5.0000 mg | ORAL_TABLET | Freq: Every evening | ORAL | Status: DC | PRN

## 2022-12-04 MED ORDER — PHENYLEPHRINE HCL-NACL 20-0.9 MG/250ML-% IV SOLN
INTRAVENOUS | Status: AC
  Filled 2022-12-04: qty 250

## 2022-12-04 MED ORDER — ETOMIDATE 2 MG/ML IV SOLN
INTRAVENOUS | Status: DC | PRN
  Administered 2022-12-04: 8 mg via INTRAVENOUS
  Administered 2022-12-04: 4 mg via INTRAVENOUS

## 2022-12-04 MED ORDER — FENTANYL CITRATE (PF) 100 MCG/2ML IJ SOLN
INTRAMUSCULAR | Status: AC
  Administered 2022-12-04: 25 ug via INTRAVENOUS
  Filled 2022-12-04: qty 2

## 2022-12-04 MED ORDER — OXYCODONE HCL 5 MG/5ML PO SOLN: 5.0000 mg | Freq: Once | ORAL | Status: DC | PRN

## 2022-12-04 MED ORDER — BUSPIRONE HCL 10 MG PO TABS
10.0000 mg | ORAL_TABLET | Freq: Two times a day (BID) | ORAL | Status: DC
  Administered 2022-12-04 – 2022-12-07 (×7): 10 mg via ORAL
  Filled 2022-12-04: qty 2
  Filled 2022-12-04 (×7): qty 1

## 2022-12-04 MED ORDER — ACETAMINOPHEN 10 MG/ML IV SOLN: 15.0000 mg/kg | Freq: Once | INTRAVENOUS | Status: DC | PRN

## 2022-12-04 MED ORDER — TRANEXAMIC ACID-NACL 1000-0.7 MG/100ML-% IV SOLN
1000.0000 mg | INTRAVENOUS | Status: AC
  Administered 2022-12-04: 1000 mg via INTRAVENOUS

## 2022-12-04 MED ORDER — METOCLOPRAMIDE HCL 5 MG/ML IJ SOLN: 5.0000 mg | Freq: Three times a day (TID) | INTRAMUSCULAR | Status: DC | PRN

## 2022-12-04 MED ORDER — PROPOFOL 10 MG/ML IV BOLUS
INTRAVENOUS | Status: AC
  Filled 2022-12-04: qty 20

## 2022-12-04 MED ORDER — METHOCARBAMOL 1000 MG/10ML IJ SOLN
500.0000 mg | Freq: Four times a day (QID) | INTRAVENOUS | Status: DC | PRN
  Administered 2022-12-04: 500 mg via INTRAVENOUS
  Filled 2022-12-04: qty 500

## 2022-12-04 MED ORDER — LACTATED RINGERS IV SOLN: INTRAVENOUS | Status: DC | PRN

## 2022-12-04 SURGICAL SUPPLY — 40 items
BIT DRILL CANN 16 HIP (BIT) IMPLANT
BIT DRILL CANN STP 6/9 HIP (BIT) IMPLANT
BLADE TFNA HELICAL 95 NON STRL (Anchor) IMPLANT
BNDG CMPR 5X4 CHSV STRCH STRL (GAUZE/BANDAGES/DRESSINGS)
BNDG COHESIVE 4X5 TAN STRL LF (GAUZE/BANDAGES/DRESSINGS) IMPLANT
BRUSH SCRUB EZ  4% CHG (MISCELLANEOUS) ×2
BRUSH SCRUB EZ 4% CHG (MISCELLANEOUS) ×2 IMPLANT
CHLORAPREP W/TINT 26 (MISCELLANEOUS) ×1 IMPLANT
DRAPE 3/4 80X56 (DRAPES) ×1 IMPLANT
DRAPE U-SHAPE 47X51 STRL (DRAPES) ×1 IMPLANT
DRSG AQUACEL AG ADV 3.5X 4 (GAUZE/BANDAGES/DRESSINGS) IMPLANT
DRSG AQUACEL AG ADV 3.5X10 (GAUZE/BANDAGES/DRESSINGS) ×1 IMPLANT
ELECT REM PT RETURN 9FT ADLT (ELECTROSURGICAL) ×1
ELECTRODE REM PT RTRN 9FT ADLT (ELECTROSURGICAL) ×1 IMPLANT
GAUZE XEROFORM 1X8 LF (GAUZE/BANDAGES/DRESSINGS) ×1 IMPLANT
GLOVE BIOGEL PI IND STRL 8.5 (GLOVE) ×1 IMPLANT
GLOVE SURG ORTHO 8.5 STRL (GLOVE) ×1 IMPLANT
GOWN STRL REUS W/ TWL LRG LVL3 (GOWN DISPOSABLE) ×1 IMPLANT
GOWN STRL REUS W/ TWL XL LVL3 (GOWN DISPOSABLE) ×1 IMPLANT
GOWN STRL REUS W/TWL LRG LVL3 (GOWN DISPOSABLE) ×1
GOWN STRL REUS W/TWL XL LVL3 (GOWN DISPOSABLE) ×1
GUIDEWIRE 3.2X400 (WIRE) IMPLANT
KIT PATIENT CARE HANA TABLE (KITS) ×1 IMPLANT
KIT TURNOVER CYSTO (KITS) ×1 IMPLANT
MANIFOLD NEPTUNE II (INSTRUMENTS) ×1 IMPLANT
MAT ABSORB  FLUID 56X50 GRAY (MISCELLANEOUS) ×1
MAT ABSORB FLUID 56X50 GRAY (MISCELLANEOUS) ×1 IMPLANT
NAIL CANN TFNA 9MM/130 360MM (Nail) IMPLANT
NDL SPNL 20GX3.5 QUINCKE YW (NEEDLE) ×1 IMPLANT
NEEDLE SPNL 20GX3.5 QUINCKE YW (NEEDLE) ×1 IMPLANT
NS IRRIG 1000ML POUR BTL (IV SOLUTION) ×1 IMPLANT
PACK HIP COMPR (MISCELLANEOUS) ×1 IMPLANT
STAPLER SKIN PROX 35W (STAPLE) ×1 IMPLANT
SUT VIC AB 0 CT1 36 (SUTURE) ×1 IMPLANT
SUT VIC AB 2-0 CT1 27 (SUTURE) ×1
SUT VIC AB 2-0 CT1 TAPERPNT 27 (SUTURE) ×2 IMPLANT
SYR 30ML LL (SYRINGE) ×1 IMPLANT
TOWEL OR 17X26 4PK STRL BLUE (TOWEL DISPOSABLE) ×1 IMPLANT
TRAP FLUID SMOKE EVACUATOR (MISCELLANEOUS) ×1 IMPLANT
WATER STERILE IRR 500ML POUR (IV SOLUTION) ×1 IMPLANT

## 2022-12-04 NOTE — Transfer of Care (Signed)
Immediate Anesthesia Transfer of Care Note  Patient: Michele [name]  Procedure(s) Performed: INTRAMEDULLARY (IM) NAIL FEMORAL (Left: Hip)  Patient Location: PACU  Anesthesia Type:General  Level of Consciousness: drowsy  Airway & Oxygen Therapy: Patient Spontanous Breathing and Patient connected to face mask oxygen  Post-op Assessment: Report given to RN  Post vital signs: Reviewed  Last Vitals:  Vitals Value Taken Time  BP 158/62   Temp 41F   Pulse 109 12/04/22 1909  Resp 22 12/04/22 1909  SpO2 79 % 12/04/22 1909  Vitals shown include unvalidated device data.  Last Pain:  Vitals:   12/04/22 1519  TempSrc: Oral  PainSc:          Complications: No notable events documented.

## 2022-12-04 NOTE — Progress Notes (Signed)
OT Cancellation Note  Patient Details Name: Michele [name] MRN: [id number] DOB: 29-Jun-1924   Cancelled Treatment:    Reason Eval/Treat Not Completed: Other (comment) (per MD note, Ortho consulted, plans for operative repair later today. New orders will be needed after surgery.)  Shanon Payor, OTD OTR/L  12/04/22, 8:52 AM

## 2022-12-04 NOTE — ED Notes (Signed)
Ptis confused and keeps saying she needs to urinate but will not use the purewick because she thinks she is urinating in the bed. bladder scan showed 494m. [facility] and [facility] urinary catheter was ordered and placed.

## 2022-12-04 NOTE — Anesthesia Postprocedure Evaluation (Signed)
Anesthesia Post Note  Patient: Michele [name]  Procedure(s) Performed: INTRAMEDULLARY (IM) NAIL FEMORAL (Left: Hip)  Patient location during evaluation: PACU Anesthesia Type: General Level of consciousness: awake and alert, oriented, patient cooperative and confused (consciousness returned to preoperative baseline) Pain management: pain level controlled Vital Signs Assessment: post-procedure vital signs reviewed and stable Respiratory status: spontaneous breathing, nonlabored ventilation and respiratory function stable Cardiovascular status: blood pressure returned to baseline and stable Postop Assessment: adequate PO intake Anesthetic complications: no   No notable events documented.   Last Vitals:  Vitals:   12/04/22 2030 12/04/22 2105  BP: 123/72 103/63  Pulse: 87 83  Resp: (!) 21 18  Temp: 36.4 C (!) 36.3 C  SpO2: 100% 100%    Last Pain:  Vitals:   12/04/22 2030  TempSrc:   PainSc: 0-No pain                 Darrin Nipper

## 2022-12-04 NOTE — Progress Notes (Signed)
PT Cancellation Note  Patient Details Name: Michele [name] MRN: [id number] DOB: [birth year]   Cancelled Treatment:    Reason Eval/Treat Not Completed: Medical issues which prohibited therapy (Consult received and chart reviewed. Patient admitted for acute hip fracture; pending surgical repair this date.  Per guidelines, will require new orders post-procedure; please reconsult as medically appropriate.)  Shakai Dolley H. Owens Shark, PT, DPT, NCS 12/04/22, 10:43 AM [phone]

## 2022-12-04 NOTE — ED Notes (Signed)
Pulse ox placed back on patient.

## 2022-12-04 NOTE — Consult Note (Signed)
ORTHOPAEDIC CONSULTATION  REQUESTING PHYSICIAN: Wouk, Ailene Rud, MD  Chief Complaint: left hip pain  HPI: Michele [name] is a 89 y.o. female who complains of left hip pain after fall. She has a history significant of right parietal occipital infarct, anxiety, PAF of AC due to chronic anemia, memory deficit,  depression, hypertension, large hiatal hernia, and diverticulosis. The pain is sharp in character. The pain is severe and 10/10. The pain is worse with movement and better with rest. Denies any numbness, tingling or constitutional symptoms. She is able to follow commands. Her history is obtained from the chart due to history of dementia.  Past Medical History:  Diagnosis Date   Aneurysm ([place])    LBBB (left bundle branch block)    MI (myocardial infarction) ([place])    Stroke [facility])    Past Surgical History:  Procedure Laterality Date   ABDOMINAL HYSTERECTOMY     APPENDECTOMY     HIP PINNING,CANNULATED Right 07/08/2018   Procedure: CANNULATED HIP PINNING;  Surgeon: Leim Fabry, MD;  Location: [facility] ORS;  Service: Orthopedics;  Laterality: Right;   ROTATOR CUFF REPAIR Left    Social History   Socioeconomic History   Marital status: Widowed    Spouse name: Not on file   Number of children: Not on file   Years of education: Not on file   Highest education level: Not on file  Occupational History   Not on file  Tobacco Use   Smoking status: Never   Smokeless tobacco: Never  Vaping Use   Vaping Use: Never used  Substance and Sexual Activity   Alcohol use: No   Drug use: Never   Sexual activity: Not Currently  Other Topics Concern   Not on file  Social History Narrative   Not on file   Social Determinants of Health   Financial Resource Strain: Not on file  Food Insecurity: No Food Insecurity (12/04/2022)   Hunger Vital Sign    Worried About Running Out of Food in the Last Year: Never true    Ran Out of Food in the Last Year: Never true  Transportation Needs: No  Transportation Needs (12/04/2022)   PRAPARE - Hydrologist (Medical): No    Lack of Transportation (Non-Medical): No  Physical Activity: Not on file  Stress: Not on file  Social Connections: Not on file   History reviewed. No pertinent family history. Allergies  Allergen Reactions   Penicillins Rash    Has patient had a PCN reaction causing immediate rash, facial/tongue/throat swelling, SOB or lightheadedness with hypotension: Yes Has patient had a PCN reaction causing severe rash involving mucus membranes or skin necrosis: No Has patient had a PCN reaction that required hospitalization: No Has patient had a PCN reaction occurring within the last 10 years: No If all of the above answers are "NO", then may proceed with Cephalosporin use.   Prior to Admission medications   Medication Sig Start Date End Date Taking? Authorizing Provider  busPIRone (BUSPAR) 10 MG tablet Take 10 mg by mouth 2 (two) times daily. 06/26/20  Yes [provider]  diclofenac sodium (VOLTAREN) 1 % GEL Apply 4 g topically 4 (four) times daily as needed (pain).   Yes [provider]  docusate sodium (COLACE) 100 MG capsule Take 200 mg by mouth daily as needed for mild constipation.   Yes [provider]  HYDROcodone-acetaminophen (NORCO/VICODIN) 5-325 MG tablet Take 1 tablet by mouth 4 (four) times daily. 08/29/18  Yes [provider]  metoprolol succinate (TOPROL-XL) 25 MG 24 hr tablet Take 25 mg by mouth daily. 08/16/20  Yes [provider]  QUEtiapine (SEROQUEL) 50 MG tablet Take 25 mg by mouth at bedtime. 08/30/20  Yes [provider]  ramipril (ALTACE) 5 MG capsule Take 5 mg by mouth daily. 07/18/20  Yes [provider]  sertraline (ZOLOFT) 25 MG tablet Take 25 mg by mouth at bedtime. 07/14/20  Yes [provider]  spironolactone (ALDACTONE) 25 MG tablet Take 12.5 mg by mouth daily. 01/01/21  Yes [provider]   triamcinolone cream (KENALOG) 0.5 % Apply 1 Application topically 2 (two) times daily. 11/29/22  Yes [provider]  ALPRAZolam (XANAX) 0.25 MG tablet Take 0.25 mg by mouth 2 (two) times daily as needed. 05/23/20   [provider]  aspirin 81 MG chewable tablet Chew 1 tablet (81 mg total) by mouth daily. Patient not taking: Reported on 12/03/2022 02/15/22   Lorella Nimrod, MD   CT HEAD WO CONTRAST (5MM)  Result Date: 12/03/2022 CLINICAL DATA:  Fall EXAM: CT HEAD WITHOUT CONTRAST TECHNIQUE: Contiguous axial images were obtained from the base of the skull through the vertex without intravenous contrast. RADIATION DOSE REDUCTION: This exam was performed according to the departmental dose-optimization program which includes automated exposure control, adjustment of the mA and/or kV according to patient size and/or use of iterative reconstruction technique. COMPARISON:  06/29/2022 FINDINGS: Brain: No evidence of acute infarction, hemorrhage, mass, mass effect, or midline shift. No hydrocephalus or extra-axial fluid collection. Redemonstrated remote infarcts in right parieto-occipital region and left greater than right cerebellar hemisphere, with interval development of additional chronic appearing infarcts in left occipital lobe (series 4, image 53) and bilateral more superior cerebellar hemispheres (series 3, image 8 and series 4, image 50-53). Periventricular white matter changes, likely the sequela of chronic small vessel ischemic disease. Vascular: No hyperdense vessel. Atherosclerotic calcifications in the intracranial carotid and vertebral arteries. Skull: Negative for fracture or focal lesion. Sinuses/Orbits: Mucosal thickening in the ethmoid air cells. No acute finding in the orbits. Status post left lens replacement. Other: The mastoid air cells are well aerated. IMPRESSION: 1. No acute intracranial process. 2. Since 06/29/2022, interval development of chronic appearing infarcts in the  left occipital lobe and bilateral superior cerebellar hemispheres. Electronically Signed   By: Merilyn Baba M.D.   On: 12/03/2022 18:13   DG Chest 1 View  Result Date: 12/03/2022 CLINICAL DATA:  Fall today.  Left hip fracture. EXAM: CHEST  1 VIEW COMPARISON:  [place] 02/14/2022 and 07/08/2018. CT chest 11/08/2017 and abdomen 06/29/2022. FINDINGS: 1704 hours. Patient is rotated to the left. There are low lung volumes. Stable cardiomegaly, aortic atherosclerosis and a small hiatal hernia. Volume loss at the left lung base and associated atelectasis are similar to recent CT. There are nonacute appearing left rib fractures. Multiple thoracolumbar compression deformities are grossly unchanged. IMPRESSION: No evidence of acute chest injury. Chronic left basilar atelectasis as seen on recent CT. Electronically Signed   By: Richardean Sale M.D.   On: 12/03/2022 17:35   DG Hip Unilat W or Wo Pelvis 2-3 Views Left  Result Date: 12/03/2022 CLINICAL DATA:  Fall today when ambulating with walker at nursing facility. EXAM: DG HIP (WITH OR WITHOUT PELVIS) 2-3V LEFT COMPARISON:  None Available. FINDINGS: AP pelvis with AP and frog-leg lateral views of the left hip. The bones are diffusely demineralized. There is a comminuted and displaced intertrochanteric left femur fracture. No evidence of  dislocation or acute pelvic fracture. Previous right femoral neck pinning noted. There are degenerative changes within the visualized lower lumbar spine. IMPRESSION: Comminuted and displaced intertrochanteric left femur fracture. Electronically Signed   By: Richardean Sale M.D.   On: 12/03/2022 17:32    Positive ROS: All other systems have been reviewed and were otherwise negative with the exception of those mentioned in the HPI and as above.  Physical Exam: General: Alert, no acute distress Cardiovascular: No pedal edema Respiratory: No cyanosis, no use of accessory musculature GI: No organomegaly, abdomen is soft and  non-tender Skin: No lesions in the area of chief complaint Neurologic: Sensation intact distally Psychiatric: Patient is competent for consent with normal mood and affect Lymphatic: No axillary or cervical lymphadenopathy  MUSCULOSKELETAL: left leg short, externally rotated, pain with IR/ER. Compartments soft. Good cap refill. Motor and sensory intact distally.  Assessment: Left intertrochanteric hip fracture, closed, displaced  Plan: Plan a left trochanteric femoral nail.  The diagnosis, risks, benefits and alternatives to treatment are all discussed in detail with the patient and family. Risks include but are not limited to bleeding, infection, deep vein thrombosis, pulmonary embolism, nerve or vascular injury, non-union, repeat operation, persistent pain, weakness, stiffness and death. Her POA understands and is eager to proceed.     Lovell Sheehan, MD    12/04/2022 5:13 PM

## 2022-12-04 NOTE — Op Note (Signed)
DATE OF SURGERY:  12/04/2022  TIME: 7:01 PM  PATIENT NAME:  Michele [name]  AGE: 89 y.o.  PRE-OPERATIVE DIAGNOSIS:  FEMUR FRACTURE  POST-OPERATIVE DIAGNOSIS:  SAME  PROCEDURE:  LEFT INTRAMEDULLARY (IM) NAIL FEMORAL  SURGEON:  Lovell Sheehan  EBL:  50 cc  COMPLICATIONS:  none apparent  OPERATIVE IMPLANTS: Synthes trochanteric femoral nail  360 mm by 9 mm  with interlocking helical blade  95 mm  PREOPERATIVE INDICATIONS:  [name] is a 89 y.o. year old who fell and suffered a hip fracture. She was brought into the ER and then admitted and optimized and then elected for surgical intervention.    The risks benefits and alternatives were discussed with the patient including but not limited to the risks of nonoperative treatment, versus surgical intervention including infection, bleeding, nerve injury, malunion, nonunion, hardware prominence, hardware failure, need for hardware removal, blood clots, cardiopulmonary complications, morbidity, mortality, among others, and they were willing to proceed.    OPERATIVE PROCEDURE:  The patient was brought to the operating room and placed in the supine position.  Spinal anesthesia was administered, with a foley. She was placed on the fracture table.  Closed reduction was performed under C-arm guidance. The length of the femur was also measured using fluoroscopy. Time out was then performed after sterile prep and drape. She received preoperative antibiotics.  Incision was made proximal to the greater trochanter. A guidewire was placed in the appropriate position. Confirmation was made on AP and lateral views. The above-named nail was opened. I opened the proximal femur with a reamer. I then placed the nail by hand easily down. I did not need to ream the femur.  Once the nail was completely seated, I placed a guidepin into the femoral head into the center center position through a second incision.  I measured the length, and then reamed the lateral  cortex and up into the head. I then placed the helical blade. Slight compression was applied. Anatomic fixation achieved. Bone quality was poor.  I then secured the proximal interlock.  I then removed the instruments, and took final C-arm pictures AP and lateral the entire length of the leg. Anatomic reconstruction was achieved, and the wounds were irrigated copiously and closed with Vicryl  followed by staples and dry sterile dressing. Sponge and needle count were correct.   The patient was awakened and returned to PACU in stable and satisfactory condition. There no complications and the patient tolerated the procedure well.  She will be weightbearing as tolerated.    Lovell Sheehan

## 2022-12-04 NOTE — Anesthesia Preprocedure Evaluation (Addendum)
Anesthesia Evaluation  Patient identified by MRN, date of birth, ID band Patient awake and Patient confused    Reviewed: Allergy & Precautions, NPO status , Patient's Chart, lab work & pertinent test results  History of Anesthesia Complications Negative for: history of anesthetic complications  Airway Mallampati: IV   Neck ROM: Full    Dental  (+) Partial Lower, Partial Upper   Pulmonary neg pulmonary ROS   Pulmonary exam normal breath sounds clear to auscultation       Cardiovascular hypertension, + Past MI and +CHF (EF 35-45%)  Normal cardiovascular exam+ dysrhythmias (a fib) + Valvular Problems/Murmurs (mild AS)  Rhythm:Regular Rate:Normal  ECG 12/03/22:  Sinus rhythm Atrial premature complex Prolonged PR interval Left bundle branch block   Neuro/Psych  PSYCHIATRIC DISORDERS Anxiety    Dementia Cerebral aneurysm found at time of CVA; ambulates with walker at baseline due to knee instability CVA    GI/Hepatic negative GI ROS,,,  Endo/Other  negative endocrine ROS    Renal/GU Renal disease (stage III CKD)     Musculoskeletal  (+) Arthritis , Rheumatoid disorders,    Abdominal   Peds  Hematology  (+) Blood dyscrasia, anemia   Anesthesia Other Findings Cardiology note 06/27/22:  1. Hypertension. Well-controlled current medications. 2. Atrial fibrillation. Rate is controlled. 3. Anxiety. Continue alprazolam as needed. 4. Alzheimer's dementia. Stable. 5. Stage III chronic kidney disease. We will check GFR today.    Reproductive/Obstetrics                              Anesthesia Physical Anesthesia Plan  ASA: 4  Anesthesia Plan: General   Post-op Pain Management:    Induction: Intravenous  PONV Risk Score and Plan: 3 and Ondansetron, Dexamethasone and Treatment may vary due to age or medical condition  Airway Management Planned: Oral ETT  Additional Equipment: Arterial  line  Intra-op Plan:   Post-operative Plan: Extubation in OR  Informed Consent: I have reviewed the patients History and Physical, chart, labs and discussed the procedure including the risks, benefits and alternatives for the proposed anesthesia with the patient or authorized representative who has indicated his/her understanding and acceptance.   Patient has DNR.  Discussed DNR with power of attorney and Suspend DNR.   Dental advisory given and Consent reviewed with POA  Plan Discussed with: CRNA  Anesthesia Plan Comments: (One unit pRBC on hold for procedure.  Patient's daughter [name] consented via phone for risks of anesthesia including but not limited to:  - adverse reactions to medications - damage to eyes, teeth, lips or other oral mucosa - nerve damage due to positioning  - sore throat or hoarseness - damage to heart, brain, nerves, lungs, other parts of body or loss of life  Informed patient's daughter about role of CRNA in peri- and intra-operative care; she voiced understanding.)         Anesthesia Quick Evaluation

## 2022-12-04 NOTE — ED Notes (Signed)
Edd Fabian, daughter, called to obtain consent for blood transfusion. Made aware of expected surgery later today, and may be called again to obtain consent for the surgery.

## 2022-12-04 NOTE — ED Notes (Signed)
OR called for report, given by this RN

## 2022-12-04 NOTE — Anesthesia Procedure Notes (Signed)
Procedure Name: Intubation Date/Time: 12/04/2022 5:59 PM  Performed by: Adalberto Ill, CRNAPre-anesthesia Checklist: Patient identified, Emergency Drugs available, Suction available, Timeout performed and Patient being monitored Patient Re-evaluated:Patient Re-evaluated prior to induction Oxygen Delivery Method: Circle system utilized Preoxygenation: Pre-oxygenation with 100% oxygen Induction Type: IV induction Ventilation: Mask ventilation without difficulty Laryngoscope Size: McGraph, Mac and 3 Grade View: Grade I Tube type: Oral Tube size: 6.5 mm Number of attempts: 1 Placement Confirmation: ETT inserted through vocal cords under direct vision, positive ETCO2 and breath sounds checked- equal and bilateral Secured at: 19 cm Tube secured with: Tape Dental Injury: Teeth and Oropharynx as per pre-operative assessment  Comments: Dentitin unchagneed

## 2022-12-04 NOTE — ED Notes (Signed)
Pt is very agitated and has pulled off her leads, pulse ox and BP cuff. Pt has a caregiver at the bedside.

## 2022-12-04 NOTE — Progress Notes (Signed)
PROGRESS NOTE    Michele [name]  MVE:[id number] DOB: 06-Jan-1924 DOA: 12/03/2022 PCP: Baxter Hire, MD  Outpatient Specialists: cardiology    Brief Narrative:   From admission h and p  Michele [name] is a 89 y.o. female with medical history significant of  89 year old female with history of right parietal occipital infarct, anxiety, PAF of AC due to chronic anemia , Anemia , memory deficit,  depression, hypertension, large hiatal hernia, diverticulosis, who presents to ED s/p slip and fall on wet surface while walking with walker at [facility]. Patient currently states she has no pain. She notes no sob/chest pain / n/v/diarrhea, but appears to have complaint of mild dysuria.  She is not able to give full history of event. Event details taken from chart.   Assessment & Plan:   Principal Problem:   S/p left hip fracture Active Problems:   Essential hypertension, benign   Age related osteoporosis   Chronic CHF ([place])   H/O: CVA (cerebrovascular accident)   PAF (paroxysmal atrial fibrillation) (HCC)   Stage 3a chronic kidney disease ([place])   Moderate late onset Alzheimer's dementia ([place])  # Left intertrochanteric femur fracture After mechanical fall at home. CT head nothing acute. TTE 02/2022 with preserved EF, no regional wall motion abnormalities. Functional status at baseline prior to fall. Do not think needs further cardiac w/u prior to surgery - Ortho consulted, plans for operative repair later today - will need pain control, bowel regimen, DVT ppx, PT/OT evals  # Anemia Chronic normocytic hgb 10 on arrival but baseline close to 8, is 7.9 today. No report of melena or other bleeding - will transfuse 1 unit and continue to monitor - check smear, iron studies  # HTN Here bp low normal - holding home meds (ramipril, spironolactone, metoprolol)  # Hyponatremia Chronic, baseline appears to be low 130s, here 129 - hold home spiro. Sertraline may also contribute - f/u urine and serum osm,  urine sodium, tsh, am cortisol  # Urinary retention This morning unable to void and bladder scan showing 400 ml. Foley placed, I/o cath doesn't appear was tried - will keep foley in for surgery but will plan on discontinuing afterward  # Hx CVA Hospitalized earlier this year, CT head shows more recent infarcts as well, nothing acute - need to ask family why no statin - doesn't appear has been taking home aspirin, also need to ask family about that  # Dementia Advanced, has home health aides 24/7, lives in assisted living, also followed by hospice - cont home seroquel qhs, xanax prn, sertraline  # A-fib Rate controlled. At last cardiology visit was on apixaban but that appears to have been stopped - resume metop when bp improves - will need to query family as to why no apixaban though frequent falls would be a reason to avoid  # Aortic stenosis Moderate. Appears compensated - monitor   DVT prophylaxis: SCDs Code Status: dnr Family Communication: daughter Edd Fabian updated telephonically 1/31  Level of care: Telemetry Medical Status is: Inpatient Remains inpatient appropriate because: severity of illness    Consultants:  ortho  Procedures: pending  Antimicrobials:  Peri-operative    Subjective: Complaining of leg pain  Objective: Vitals:   12/03/22 2327 12/04/22 0030 12/04/22 0536 12/04/22 0600  BP:  105/77 110/62 (!) 103/58  Pulse:  80 70 64  Resp:  '18 16 16  '$ Temp: 98.2 F (36.8 C)  98.2 F (36.8 C)   TempSrc: Oral  Oral  SpO2:  100% 100% 99%  Weight:      Height:        Intake/Output Summary (Last 24 hours) at 12/04/2022 0807 Last data filed at 12/03/2022 2057 Gross per 24 hour  Intake 1000 ml  Output --  Net 1000 ml   Filed Weights   12/03/22 1646  Weight: 45 kg    Examination:  General exam: mildly uncomfortable  Respiratory system: Clear to auscultation. Respiratory effort normal. Cardiovascular system: S1 & S2 heard, irreg, mod systolic  murmur Gastrointestinal system: Abdomen is nondistended, soft and nontender. No organomegaly or masses felt. Normal bowel sounds heard. Central nervous system: awake and alert oriented to self Extremities: 1+ LE edema, left left externally rotated Skin: No rashes, lesions or ulcers Psychiatry: calm    Data Reviewed: I have personally reviewed following labs and imaging studies  CBC: Recent Labs  Lab 12/03/22 1818 12/04/22 0539  WBC 18.2* 9.2  NEUTROABS 15.8*  --   HGB 10.0* 7.9*  HCT 31.4* 25.4*  MCV 85.1 86.4  PLT 322 458   Basic Metabolic Panel: Recent Labs  Lab 12/03/22 1818 12/03/22 2144 12/04/22 0539  NA 127* 128* 129*  K 4.4 4.3 4.5  CL 96* 98 101  CO2 22 21* 23  GLUCOSE 117* 110* 109*  BUN '18 16 15  '$ CREATININE 0.93 0.91 0.73  CALCIUM 8.7* 8.4* 8.1*   GFR: Estimated Creatinine Clearance: 29.2 mL/min (by C-G formula based on SCr of 0.73 mg/dL). Liver Function Tests: Recent Labs  Lab 12/03/22 1818  AST 19  ALT 12  ALKPHOS 72  BILITOT 0.5  PROT 6.0*  ALBUMIN 3.3*   No results for input(s): "LIPASE", "AMYLASE" in the last 168 hours. No results for input(s): "AMMONIA" in the last 168 hours. Coagulation Profile: Recent Labs  Lab 12/03/22 1818  INR 1.1   Cardiac Enzymes: No results for input(s): "CKTOTAL", "CKMB", "CKMBINDEX", "TROPONINI" in the last 168 hours. BNP (last 3 results) No results for input(s): "PROBNP" in the last 8760 hours. HbA1C: No results for input(s): "HGBA1C" in the last 72 hours. CBG: No results for input(s): "GLUCAP" in the last 168 hours. Lipid Profile: No results for input(s): "CHOL", "HDL", "LDLCALC", "TRIG", "CHOLHDL", "LDLDIRECT" in the last 72 hours. Thyroid Function Tests: No results for input(s): "TSH", "T4TOTAL", "FREET4", "T3FREE", "THYROIDAB" in the last 72 hours. Anemia Panel: No results for input(s): "VITAMINB12", "FOLATE", "FERRITIN", "TIBC", "IRON", "RETICCTPCT" in the last 72 hours. Urine analysis:     Component Value Date/Time   COLORURINE STRAW (A) 12/03/2022 2050   APPEARANCEUR CLEAR (A) 12/03/2022 2050   LABSPEC 1.010 12/03/2022 2050   PHURINE 7.0 12/03/2022 2050   GLUCOSEU NEGATIVE 12/03/2022 2050   [place] NEGATIVE 12/03/2022 2050   [place] NEGATIVE 12/03/2022 2050   [place] 12/03/2022 2050   PROTEINUR NEGATIVE 12/03/2022 2050   NITRITE NEGATIVE 12/03/2022 2050   LEUKOCYTESUR SMALL (A) 12/03/2022 2050   Sepsis Labs: '@LABRCNTIP'$ (procalcitonin:4,lacticidven:4)  ) Recent Results (from the past 240 hour(s))  Resp panel by RT-PCR (RSV, Flu A&B, Covid) Anterior Nasal Swab     Status: None   Collection Time: 12/03/22  6:47 PM   Specimen: Anterior Nasal Swab  Result Value Ref Range Status   SARS Coronavirus 2 by RT PCR NEGATIVE NEGATIVE Final    Comment: (NOTE) SARS-CoV-2 target nucleic acids are NOT DETECTED.  The SARS-CoV-2 RNA is generally detectable in upper respiratory specimens during the acute phase of infection. The lowest concentration of SARS-CoV-2 viral copies this assay can detect  is 138 copies/mL. A negative result does not preclude SARS-Cov-2 infection and should not be used as the sole basis for treatment or other patient management decisions. A negative result may occur with  improper specimen collection/handling, submission of specimen other than nasopharyngeal swab, presence of viral mutation(s) within the areas targeted by this assay, and inadequate number of viral copies(<138 copies/mL). A negative result must be combined with clinical observations, patient history, and epidemiological information. The expected result is Negative.  Fact Sheet for Patients:  EntrepreneurPulse.com.au  Fact Sheet for Healthcare Providers:  IncredibleEmployment.be  This test is no t yet approved or cleared by the [place] FDA and  has been authorized for detection and/or diagnosis of SARS-CoV-2 by FDA under an  Emergency Use Authorization (EUA). This EUA will remain  in effect (meaning this test can be used) for the duration of the COVID-19 declaration under Section 564(b)(1) of the Act, 21 U.S.C.section 360bbb-3(b)(1), unless the authorization is terminated  or revoked sooner.       Influenza A by PCR NEGATIVE NEGATIVE Final   Influenza B by PCR NEGATIVE NEGATIVE Final    Comment: (NOTE) The Xpert Xpress SARS-CoV-2/FLU/RSV plus assay is intended as an aid in the diagnosis of influenza from Nasopharyngeal swab specimens and should not be used as a sole basis for treatment. Nasal washings and aspirates are unacceptable for Xpert Xpress SARS-CoV-2/FLU/RSV testing.  Fact Sheet for Patients: EntrepreneurPulse.com.au  Fact Sheet for Healthcare Providers: IncredibleEmployment.be  This test is not yet approved or cleared by the [place] FDA and has been authorized for detection and/or diagnosis of SARS-CoV-2 by FDA under an Emergency Use Authorization (EUA). This EUA will remain in effect (meaning this test can be used) for the duration of the COVID-19 declaration under Section 564(b)(1) of the Act, 21 U.S.C. section 360bbb-3(b)(1), unless the authorization is terminated or revoked.     Resp Syncytial Virus by PCR NEGATIVE NEGATIVE Final    Comment: (NOTE) Fact Sheet for Patients: EntrepreneurPulse.com.au  Fact Sheet for Healthcare Providers: IncredibleEmployment.be  This test is not yet approved or cleared by the [place] FDA and has been authorized for detection and/or diagnosis of SARS-CoV-2 by FDA under an Emergency Use Authorization (EUA). This EUA will remain in effect (meaning this test can be used) for the duration of the COVID-19 declaration under Section 564(b)(1) of the Act, 21 U.S.C. section 360bbb-3(b)(1), unless the authorization is terminated or revoked.  Performed at [facility], [address] [place], [place] [address]          Radiology Studies: CT HEAD WO CONTRAST (5MM)  Result Date: 12/03/2022 CLINICAL DATA:  Fall EXAM: CT HEAD WITHOUT CONTRAST TECHNIQUE: Contiguous axial images were obtained from the base of the skull through the vertex without intravenous contrast. RADIATION DOSE REDUCTION: This exam was performed according to the departmental dose-optimization program which includes automated exposure control, adjustment of the mA and/or kV according to patient size and/or use of iterative reconstruction technique. COMPARISON:  06/29/2022 FINDINGS: Brain: No evidence of acute infarction, hemorrhage, mass, mass effect, or midline shift. No hydrocephalus or extra-axial fluid collection. Redemonstrated remote infarcts in right parieto-occipital region and left greater than right cerebellar hemisphere, with interval development of additional chronic appearing infarcts in left occipital lobe (series 4, image 53) and bilateral more superior cerebellar hemispheres (series 3, image 8 and series 4, image 50-53). Periventricular white matter changes, likely the sequela of chronic small vessel ischemic disease. Vascular: No hyperdense vessel. Atherosclerotic calcifications in the  intracranial carotid and vertebral arteries. Skull: Negative for fracture or focal lesion. Sinuses/Orbits: Mucosal thickening in the ethmoid air cells. No acute finding in the orbits. Status post left lens replacement. Other: The mastoid air cells are well aerated. IMPRESSION: 1. No acute intracranial process. 2. Since 06/29/2022, interval development of chronic appearing infarcts in the left occipital lobe and bilateral superior cerebellar hemispheres. Electronically Signed   By: Merilyn Baba M.D.   On: 12/03/2022 18:13   DG Chest 1 View  Result Date: 12/03/2022 CLINICAL DATA:  Fall today.  Left hip fracture. EXAM: CHEST  1 VIEW COMPARISON:  [place] 02/14/2022 and 07/08/2018. CT chest 11/08/2017  and abdomen 06/29/2022. FINDINGS: 1704 hours. Patient is rotated to the left. There are low lung volumes. Stable cardiomegaly, aortic atherosclerosis and a small hiatal hernia. Volume loss at the left lung base and associated atelectasis are similar to recent CT. There are nonacute appearing left rib fractures. Multiple thoracolumbar compression deformities are grossly unchanged. IMPRESSION: No evidence of acute chest injury. Chronic left basilar atelectasis as seen on recent CT. Electronically Signed   By: Richardean Sale M.D.   On: 12/03/2022 17:35   DG Hip Unilat W or Wo Pelvis 2-3 Views Left  Result Date: 12/03/2022 CLINICAL DATA:  Fall today when ambulating with walker at nursing facility. EXAM: DG HIP (WITH OR WITHOUT PELVIS) 2-3V LEFT COMPARISON:  None Available. FINDINGS: AP pelvis with AP and frog-leg lateral views of the left hip. The bones are diffusely demineralized. There is a comminuted and displaced intertrochanteric left femur fracture. No evidence of dislocation or acute pelvic fracture. Previous right femoral neck pinning noted. There are degenerative changes within the visualized lower lumbar spine. IMPRESSION: Comminuted and displaced intertrochanteric left femur fracture. Electronically Signed   By: Richardean Sale M.D.   On: 12/03/2022 17:32        Scheduled Meds: Continuous Infusions:   LOS: 1 day     Desma Maxim, MD [facility]   If 7PM-7AM, please contact night-coverage [email or web address] Password [facility] 12/04/2022, 8:07 AM

## 2022-12-04 NOTE — Anesthesia Procedure Notes (Addendum)
Arterial Line Insertion Start/End1/31/2024 6:00 PM, 12/04/2022 6:05 PM Performed by: Darrin Nipper, MD, anesthesiologist  Patient location: Pre-op. Preanesthetic checklist: patient identified, IV checked, risks and benefits discussed, surgical consent, monitors and equipment checked, pre-op evaluation, timeout performed and anesthesia consent Patient sedated Left, radial was placed Catheter size: 20 G Hand hygiene performed  and maximum sterile barriers used   Attempts: 1 Procedure performed using ultrasound guided technique. Ultrasound Notes:anatomy identified, needle tip was noted to be adjacent to the nerve/plexus identified and no ultrasound evidence of intravascular and/or intraneural injection Following insertion, dressing applied. Post procedure assessment: normal and unchanged  Patient tolerated the procedure well with no immediate complications.

## 2022-12-04 NOTE — Progress Notes (Signed)
[facility] ED 16 [facility]) Hospitalized Hospice patient visit  Michele [name] is a current [facility] patient with a terminal diagnosis of Other signs and symptoms involving cognitive functions following cerebral infarction.  She was ambulating in the dining room of her facility when she had a fall with almost immediate complaint of hip pain. She was transferred to [facility] ED via EMS and [facility] was aware of the transfer. She was admitted 1.30.24 with diagnosis of left hip fracture. Per Dr. Karie Georges with [facility] this is a related hospital admission.   Visited with Michele [name] at bedside in ER. She is awake and alert with caregiver at bedside. She is currently receiving pRBC and is pending surgery this afternoon for a broken hip. When asked about pain she reports she is hurting but points to the side of her head. Per caregiver she has been complaining of pain off and on but has not shown any signs of distress. She is being treated for her pain.   Patient is inpatient appropriate pending IM nailing of left hip fracture today and skilled follow up after.   Vital Signs- 98.7/ 80/ 18    110/68    97% room air I&O- Not yet recorded Abnormal Labs- Na+ 129, Ca+ 8.1, Albumin 3.3, RBC 2.95, Hgb 8, Hct 25.4,  Diagnostics-  DG HIP (WITH OR WITHOUT PELVIS) 2-3V LEFT COMPARISON:  None Available. FINDINGS: AP pelvis with AP and frog-leg lateral views of the left hip. The bones are diffusely demineralized. There is a comminuted and displaced intertrochanteric left femur fracture. No evidence of dislocation or acute pelvic fracture. Previous right femoral neck pinning noted. There are degenerative changes within the visualized lower lumbar spine.  IMPRESSION: Comminuted and displaced intertrochanteric left femur fracture.  IV/PRN Meds- Morphine '2mg'$  IVx1, Morphine 0.'5mg'$  x2, Vicodin 5/325 x1, Alprazolam 0.'25mg'$  PO x1, NS '1000mg'$  IVx1,  Problem List- # Left intertrochanteric femur fracture After mechanical fall at home. CT  head nothing acute. TTE 02/2022 with preserved EF, no regional wall motion abnormalities. Functional status at baseline prior to fall. Do not think needs further cardiac w/u prior to surgery - Ortho consulted, plans for operative repair later today - will need pain control, bowel regimen, DVT ppx, PT/OT evals   # Anemia Chronic normocytic hgb 10 on arrival but baseline close to 8, is 7.9 today. No report of melena or other bleeding - will transfuse 1 unit and continue to monitor - check smear, iron studies   # HTN Here bp low normal - holding home meds (ramipril, spironolactone, metoprolol)   # Hyponatremia Chronic, baseline appears to be low 130s, here 129 - hold home spiro. Sertraline may also contribute - f/u urine and serum osm, urine sodium, tsh, am cortisol   # Urinary retention This morning unable to void and bladder scan showing 400 ml. Foley placed, I/o cath doesn't appear was tried - will keep foley in for surgery but will plan on discontinuing afterward   Discharge Planning- Ongoing, back to ALF vs LTC pending surgery outcomes Family Contact- Talked with CG at bedside, left VM for daughter IDT: updated Goals of Care: Ongoing, currently patient wants to proceed with surgery  Thank you for the opportunity to participate in this patient's care, please don't hesitate to call for any hospice related questions or concerns.  Michele [name] BSN, [employer] [phone]

## 2022-12-05 ENCOUNTER — Encounter: Payer: Self-pay | Admitting: Orthopedic Surgery

## 2022-12-05 DIAGNOSIS — Z8781 Personal history of (healed) traumatic fracture: Secondary | ICD-10-CM | POA: Diagnosis not present

## 2022-12-05 LAB — BPAM RBC
Blood Product Expiration Date: 202403062359
Blood Product Expiration Date: 202403062359
ISSUE DATE / TIME: 202401310849
ISSUE DATE / TIME: 202401311758
Unit Type and Rh: 5100
Unit Type and Rh: 5100

## 2022-12-05 LAB — TYPE AND SCREEN
ABO/RH(D): O POS
ABO/RH(D): O POS
Antibody Screen: NEGATIVE
Antibody Screen: NEGATIVE
Unit division: 0
Unit division: 0

## 2022-12-05 LAB — BASIC METABOLIC PANEL
Anion gap: 5 (ref 5–15)
BUN: 13 mg/dL (ref 8–23)
CO2: 21 mmol/L — ABNORMAL LOW (ref 22–32)
Calcium: 7.9 mg/dL — ABNORMAL LOW (ref 8.9–10.3)
Chloride: 103 mmol/L (ref 98–111)
Creatinine, Ser: 0.7 mg/dL (ref 0.44–1.00)
GFR, Estimated: >60 mL/min (ref >60)
Glucose, Bld: 111 mg/dL — ABNORMAL HIGH (ref 70–99)
Potassium: 4.1 mmol/L (ref 3.5–5.1)
Sodium: 129 mmol/L — ABNORMAL LOW (ref 135–145)

## 2022-12-05 LAB — CBC
HCT: 28.7 % — ABNORMAL LOW (ref 36.0–46.0)
Hemoglobin: 9.5 g/dL — ABNORMAL LOW (ref 12.0–15.0)
MCH: 28.6 pg (ref 26.0–34.0)
MCHC: 33.1 g/dL (ref 30.0–36.0)
MCV: 86.4 fL (ref 80.0–100.0)
Platelets: 210 10*3/uL (ref 150–400)
RBC: 3.32 MIL/uL — ABNORMAL LOW (ref 3.87–5.11)
RDW: 16.7 % — ABNORMAL HIGH (ref 11.5–15.5)
WBC: 8.7 10*3/uL (ref 4.0–10.5)
nRBC: 0 % (ref 0.0–0.2)

## 2022-12-05 LAB — GLUCOSE, CAPILLARY: Glucose-Capillary: 101 mg/dL — ABNORMAL HIGH (ref 70–99)

## 2022-12-05 LAB — HEMOGLOBIN AND HEMATOCRIT, BLOOD
HCT: 29.4 % — ABNORMAL LOW (ref 36.0–46.0)
Hemoglobin: 9.7 g/dL — ABNORMAL LOW (ref 12.0–15.0)

## 2022-12-05 LAB — CORTISOL-AM, BLOOD: Cortisol - AM: 17.4 ug/dL (ref 6.7–22.6)

## 2022-12-05 LAB — OSMOLALITY: Osmolality: 274 mosm/kg — ABNORMAL LOW (ref 275–295)

## 2022-12-05 MED ORDER — SODIUM CHLORIDE 0.9 % IV BOLUS
500.0000 mL | Freq: Once | INTRAVENOUS | Status: AC
  Administered 2022-12-05: 500 mL via INTRAVENOUS

## 2022-12-05 MED ORDER — ENOXAPARIN SODIUM 30 MG/0.3ML IJ SOSY
30.0000 mg | PREFILLED_SYRINGE | INTRAMUSCULAR | Status: DC
  Administered 2022-12-06 – 2022-12-07 (×2): 30 mg via SUBCUTANEOUS
  Filled 2022-12-05 (×2): qty 0.3

## 2022-12-05 MED ORDER — CEFAZOLIN SODIUM-DEXTROSE 1-4 GM/50ML-% IV SOLN
INTRAVENOUS | Status: AC
  Administered 2022-12-05: 1 g via INTRAVENOUS
  Filled 2022-12-05: qty 50

## 2022-12-05 MED ORDER — ENSURE ENLIVE PO LIQD
237.0000 mL | Freq: Two times a day (BID) | ORAL | Status: DC
  Administered 2022-12-05 – 2022-12-06 (×4): 237 mL via ORAL

## 2022-12-05 MED ORDER — ADULT MULTIVITAMIN W/MINERALS CH
1.0000 | ORAL_TABLET | Freq: Every day | ORAL | Status: DC
  Administered 2022-12-06 – 2022-12-07 (×2): 1 via ORAL
  Filled 2022-12-05 (×2): qty 1

## 2022-12-05 MED ORDER — SODIUM CHLORIDE 0.9 % IV SOLN: INTRAVENOUS | Status: DC

## 2022-12-05 MED ORDER — CEFAZOLIN SODIUM-DEXTROSE 1-4 GM/50ML-% IV SOLN
INTRAVENOUS | Status: AC
  Filled 2022-12-05: qty 50

## 2022-12-05 MED ORDER — ADULT MULTIVITAMIN W/MINERALS CH
ORAL_TABLET | ORAL | Status: AC
  Administered 2022-12-05: 1 via ORAL
  Filled 2022-12-05: qty 1

## 2022-12-05 MED ORDER — ENOXAPARIN SODIUM 30 MG/0.3ML IJ SOSY
PREFILLED_SYRINGE | INTRAMUSCULAR | Status: AC
  Administered 2022-12-05: 30 mg via SUBCUTANEOUS
  Filled 2022-12-05: qty 0.3

## 2022-12-05 MED ORDER — DOCUSATE SODIUM 100 MG PO CAPS
ORAL_CAPSULE | ORAL | Status: AC
  Administered 2022-12-05: 100 mg via ORAL
  Filled 2022-12-05: qty 1

## 2022-12-05 NOTE — Plan of Care (Signed)

## 2022-12-05 NOTE — Plan of Care (Signed)
  Problem: Education: Goal: Knowledge of General Education information will improve Description: Including pain rating scale, medication(s)/side effects and non-pharmacologic comfort measures 12/05/2022 2234 by Noah Delaine, RN Outcome: Progressing 12/05/2022 2233 by Noah Delaine, RN Outcome: Progressing   Problem: Pain Managment: Goal: General experience of comfort will improve 12/05/2022 2234 by Noah Delaine, RN Outcome: Progressing 12/05/2022 2233 by Noah Delaine, RN Outcome: Progressing   Problem: Safety: Goal: Ability to remain free from injury will improve 12/05/2022 2234 by Noah Delaine, RN Outcome: Progressing 12/05/2022 2233 by Noah Delaine, RN Outcome: Progressing   Problem: Skin Integrity: Goal: Risk for impaired skin integrity will decrease 12/05/2022 2234 by Noah Delaine, RN Outcome: Progressing 12/05/2022 2233 by Noah Delaine, RN Outcome: Progressing

## 2022-12-05 NOTE — TOC Progression Note (Addendum)
Transition of Care [facility]) - Progression Note    Patient Details  Name: Michele [name] MRN: [id number] Date of Birth: 10/18/1924  Transition [facility] CM/SW [place], RN Phone Number: 12/05/2022, 10:58 AM  Clinical Narrative:    The patient's daughter Edd Fabian called and stated that they would like the patient to return to [facility] with Hospice instead of going to rehab. I called and spoke with [facility] and they agree that they will take her back and they will need Fl2 and orders for [facility], the patient has paid 24/7 care there at [facility]   Expected Discharge Plan: [place] Barriers to Discharge: Insurance Authorization, SNF Pending bed offer  Expected Discharge Plan and Services   Discharge Planning Services: CM Consult   Living arrangements for the past 2 months: [place]                                       Social Determinants of Health (SDOH) Interventions SDOH Screenings   Food Insecurity: No Food Insecurity (12/04/2022)  Housing: Low Risk  (12/04/2022)  Transportation Needs: No Transportation Needs (12/04/2022)  Utilities: Not At Risk (12/04/2022)  Tobacco Use: Low Risk  (12/04/2022)    Readmission Risk Interventions     No data to display

## 2022-12-05 NOTE — Progress Notes (Signed)
Subjective:  Patient reports pain as mild to moderate.    Objective:   VITALS:   Vitals:   12/05/22 0043 12/05/22 0240 12/05/22 0358 12/05/22 0507  BP: 103/66 (!) 101/51 95/60 106/60  Pulse: 75 76  68  Resp:      Temp:    97.8 F (36.6 C)  TempSrc:      SpO2: 98% 97% 96% 95%  Weight:      Height:        PHYSICAL EXAM:  Neurologically intact ABD soft Neurovascular intact Sensation intact distally Intact pulses distally Dorsiflexion/Plantar flexion intact Incision: dressing C/D/I No cellulitis present Compartment soft  LABS  Results for orders placed or performed during the hospital encounter of 12/03/22 (from the past 24 hour(s))  Prepare RBC (crossmatch)     Status: None   Collection Time: 12/04/22  8:16 AM  Result Value Ref Range   Order Confirmation      ORDER PROCESSED BY BLOOD BANK Performed at [facility], [address] [place], [place] [address]   Prepare RBC (crossmatch)     Status: None   Collection Time: 12/04/22  4:52 PM  Result Value Ref Range   Order Confirmation      ORDER PROCESSED BY BLOOD BANK Performed at [facility], [address] [place], [place] [address]   Hemoglobin and hematocrit, blood     Status: Abnormal   Collection Time: 12/05/22 12:55 AM  Result Value Ref Range   Hemoglobin 9.7 (L) 12.0 - 15.0 g/dL   HCT 29.4 (L) 36.0 - 46.0 %  Type and screen [place]     Status: None   Collection Time: 12/05/22 12:55 AM  Result Value Ref Range   ABO/RH(D) O POS    Antibody Screen NEG    Sample Expiration      12/08/2022,2359 Performed at [facility], [address] [place], [place] [address]   Glucose, capillary     Status: Abnormal   Collection Time: 12/05/22  1:51 AM  Result Value Ref Range   Glucose-Capillary 101 (H) 70 - 99 mg/dL    DG HIP UNILAT WITH PELVIS 2-3 VIEWS LEFT  Result Date: 12/04/2022 CLINICAL DATA:  Status post ORIF of the left hip. EXAM: DG HIP (WITH  OR WITHOUT PELVIS) 2-3V LEFT COMPARISON:  12/03/2022 FINDINGS: Six images obtained via portable C-arm radiography in the operating room were submitted. Images demonstrate open reduction and internal fixation of the comminuted intertrochanteric fracture of the left femur. Placement of IM nail and hip screw noted. IMPRESSION: Status post ORIF of comminuted intertrochanteric fracture of the proximal left femur. Electronically Signed   By: Kerby Moors M.D.   On: 12/04/2022 18:50   DG C-Arm 1-60 Min-No Report  Result Date: 12/04/2022 Fluoroscopy was utilized by the requesting physician.  No radiographic interpretation.   CT HEAD WO CONTRAST (5MM)  Result Date: 12/03/2022 CLINICAL DATA:  Fall EXAM: CT HEAD WITHOUT CONTRAST TECHNIQUE: Contiguous axial images were obtained from the base of the skull through the vertex without intravenous contrast. RADIATION DOSE REDUCTION: This exam was performed according to the departmental dose-optimization program which includes automated exposure control, adjustment of the mA and/or kV according to patient size and/or use of iterative reconstruction technique. COMPARISON:  06/29/2022 FINDINGS: Brain: No evidence of acute infarction, hemorrhage, mass, mass effect, or midline shift. No hydrocephalus or extra-axial fluid collection. Redemonstrated remote infarcts in right parieto-occipital region and left greater than right cerebellar hemisphere, with interval development  of additional chronic appearing infarcts in left occipital lobe (series 4, image 53) and bilateral more superior cerebellar hemispheres (series 3, image 8 and series 4, image 50-53). Periventricular white matter changes, likely the sequela of chronic small vessel ischemic disease. Vascular: No hyperdense vessel. Atherosclerotic calcifications in the intracranial carotid and vertebral arteries. Skull: Negative for fracture or focal lesion. Sinuses/Orbits: Mucosal thickening in the ethmoid air cells. No acute  finding in the orbits. Status post left lens replacement. Other: The mastoid air cells are well aerated. IMPRESSION: 1. No acute intracranial process. 2. Since 06/29/2022, interval development of chronic appearing infarcts in the left occipital lobe and bilateral superior cerebellar hemispheres. Electronically Signed   By: Merilyn Baba M.D.   On: 12/03/2022 18:13   DG Chest 1 View  Result Date: 12/03/2022 CLINICAL DATA:  Fall today.  Left hip fracture. EXAM: CHEST  1 VIEW COMPARISON:  [place] 02/14/2022 and 07/08/2018. CT chest 11/08/2017 and abdomen 06/29/2022. FINDINGS: 1704 hours. Patient is rotated to the left. There are low lung volumes. Stable cardiomegaly, aortic atherosclerosis and a small hiatal hernia. Volume loss at the left lung base and associated atelectasis are similar to recent CT. There are nonacute appearing left rib fractures. Multiple thoracolumbar compression deformities are grossly unchanged. IMPRESSION: No evidence of acute chest injury. Chronic left basilar atelectasis as seen on recent CT. Electronically Signed   By: Richardean Sale M.D.   On: 12/03/2022 17:35   DG Hip Unilat W or Wo Pelvis 2-3 Views Left  Result Date: 12/03/2022 CLINICAL DATA:  Fall today when ambulating with walker at nursing facility. EXAM: DG HIP (WITH OR WITHOUT PELVIS) 2-3V LEFT COMPARISON:  None Available. FINDINGS: AP pelvis with AP and frog-leg lateral views of the left hip. The bones are diffusely demineralized. There is a comminuted and displaced intertrochanteric left femur fracture. No evidence of dislocation or acute pelvic fracture. Previous right femoral neck pinning noted. There are degenerative changes within the visualized lower lumbar spine. IMPRESSION: Comminuted and displaced intertrochanteric left femur fracture. Electronically Signed   By: Richardean Sale M.D.   On: 12/03/2022 17:32    Assessment/Plan: 1 Day Post-Op   Principal Problem:   S/p left hip fracture Active Problems:    Essential hypertension, benign   Age related osteoporosis   Chronic CHF ([place])   H/O: CVA (cerebrovascular accident)   PAF (paroxysmal atrial fibrillation) ([place])   Stage 3a chronic kidney disease (HCC)   Moderate late onset Alzheimer's dementia ([place])   Fracture, hip, left, closed, initial encounter ([place])   Up with therapy Hydrocodone prn for pain Aspirin 81 mg once daily for 30 days WBAT Discharge per medicine Follow up in office in 2 weeks ( FEB 13 or 14 with Carlynn Spry PA-C  ) call to confirm appointment [place] [id number]  Carlynn Spry , PA-C 12/05/2022, 7:12 AM

## 2022-12-05 NOTE — Progress Notes (Signed)
When tech went in to check patients BP patient was hypotensive. Paged hospitalist Dr Randol Kern and was advised to bolus 500 of NS. Other orders were also placed. Blood draws

## 2022-12-05 NOTE — NC FL2 (Signed)
[place] LEVEL OF CARE FORM     IDENTIFICATION  Patient Name: Michele [name] Birthdate: April 08, 1924 Sex: female Admission Date (Current Location): 12/03/2022  [facility] and [place] Number:  Engineering geologist and Address:  [facility], [address], [place], [place] [address]      Provider Number: [id number]  Attending Physician Name and Address:  Gwynne Edinger, MD  Relative Name and Phone Number:  Edd Fabian Daughter [phone]    Current Level of Care: Hospital Recommended Level of Care: [place] Prior Approval Number:    Date Approved/Denied:   PASRR Number: [id number]  Discharge Plan: Other (Comment) (ALF)    Current Diagnoses: Patient Active Problem List   Diagnosis Date Noted   Fracture, hip, left, closed, initial encounter ([place]) 12/04/2022   S/p left hip fracture 12/03/2022   Colitis    Abdominal pain 06/29/2022   AKI (acute kidney injury) ([place]) 06/29/2022   Diarrhea 06/29/2022   PAF (paroxysmal atrial fibrillation) ([place]) 06/29/2022   Failure to thrive in adult 06/29/2022   Leukocytosis 06/29/2022   CVA (cerebral vascular accident) ([place]) 02/14/2022   Depression 02/14/2022   Moderate late onset Alzheimer's dementia ([place]) 10/03/2021   Stage 3a chronic kidney disease ([place]) 06/12/2021   Senile purpura ([place]) 10/14/2019   Nail, injury by 08/30/2019   Foot ulcer ([place]) 08/30/2019   Mild aortic valve stenosis 05/03/2019   Arthritis of both knees 03/31/2019   Chronic CHF ([place]) 03/31/2019   Myocardial infarction ([place]) 03/31/2019   Osteoporosis, 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/31/2019   Rheumatoid arthritis, unspecified ([place]) 03/31/2019   Situational anxiety 03/31/2019   Essential hypertension, benign 08/13/2018   Age related osteoporosis 07/13/2018   Closed subcapital fracture of neck of right femur, initial encounter ([place]) 07/08/2018   Bradycardia 12/29/2017   Pneumonia 11/06/2017   Hyperlipidemia, mixed 06/30/2017    Cerebral aneurysm without rupture 06/23/2017   Bundle branch block, left 12/19/2016   Vitamin D deficiency 12/19/2016   PSVT (paroxysmal supraventricular tachycardia) 11/25/2016   B12 deficiency 06/19/2016   H/O: CVA (cerebrovascular accident) 03/01/2016   Spondylosis of cervical region without myelopathy or radiculopathy 09/13/2015   Primary osteoarthritis of both knees 03/14/2015   Arthropathy, lower leg 10/16/2014   DDD (degenerative disc disease), lumbar 04/11/2014   Lumbar radiculitis 04/11/2014    Orientation RESPIRATION BLADDER Height & Weight     Self, Situation, Place  Normal Continent, External catheter Weight: 45 kg Height:  '5\' 2"'$  (157.5 cm)  BEHAVIORAL SYMPTOMS/MOOD NEUROLOGICAL BOWEL NUTRITION STATUS      Continent Diet (See DC summary)  AMBULATORY STATUS COMMUNICATION OF NEEDS Skin   Extensive Assist Verbally Normal, Surgical wounds                       Personal Care Assistance Level of Assistance  Bathing, Feeding, Dressing Bathing Assistance: Maximum assistance Feeding assistance: Limited assistance Dressing Assistance: Maximum assistance     Functional Limitations Info             SPECIAL CARE FACTORS FREQUENCY  PT (By licensed PT), OT (By licensed OT)     PT Frequency: 5 times per week OT Frequency: 5 times per week            Contractures      Additional Factors Info  Code Status, Allergies Code Status Info: DNR Allergies Info: Penicillins           Current Medications (12/05/2022):  This is the current hospital active  medication list Current Facility-Administered Medications  Medication Dose Route Frequency Provider Last Rate Last Admin   0.9 %  sodium chloride infusion (Manually program via Guardrails IV Fluids)   Intravenous Once Lovell Sheehan, MD   Held at 12/04/22 0831   0.9 %  sodium chloride infusion   Intravenous Continuous Sharion Settler, NP 75 mL/hr at 12/05/22 0654 Rate Change at 12/05/22 0654   ALPRAZolam  (XANAX) tablet 0.25 mg  0.25 mg Oral BID PRN Lovell Sheehan, MD       busPIRone (BUSPAR) tablet 10 mg  10 mg Oral BID Lovell Sheehan, MD   10 mg at 12/04/22 2149   ceFAZolin (ANCEF) 1-4 GM/50ML-% IVPB            ceFAZolin (ANCEF) IVPB 1 g/50 mL premix  1 g Intravenous Q6H Lovell Sheehan, MD   Stopped at 12/05/22 0457   diphenhydrAMINE (BENADRYL) 12.5 MG/5ML elixir 12.5-25 mg  12.5-25 mg Oral Q4H PRN Lovell Sheehan, MD       docusate sodium (COLACE) 100 MG capsule            docusate sodium (COLACE) capsule 100 mg  100 mg Oral BID Lovell Sheehan, MD       enoxaparin (LOVENOX) injection 30 mg  30 mg Subcutaneous Q24H Wouk, Ailene Rud, MD       feeding supplement (ENSURE ENLIVE / ENSURE PLUS) liquid 237 mL  237 mL Oral BID BM Lovell Sheehan, MD       HYDROcodone-acetaminophen (NORCO/VICODIN) 5-325 MG per tablet 1-2 tablet  1-2 tablet Oral Q6H PRN Lovell Sheehan, MD   1 tablet at 12/04/22 0951   methocarbamol (ROBAXIN) tablet 500 mg  500 mg Oral Q6H PRN Lovell Sheehan, MD       Or   methocarbamol (ROBAXIN) 500 mg in dextrose 5 % 50 mL IVPB  500 mg Intravenous Q6H PRN Lovell Sheehan, MD   Stopped at 12/04/22 2124   metoCLOPramide (REGLAN) tablet 5-10 mg  5-10 mg Oral Q8H PRN Lovell Sheehan, MD       Or   metoCLOPramide (REGLAN) injection 5-10 mg  5-10 mg Intravenous Q8H PRN Lovell Sheehan, MD       morphine (PF) 2 MG/ML injection 0.5 mg  0.5 mg Intravenous Q2H PRN Lovell Sheehan, MD   0.5 mg at 12/04/22 0123   multivitamin with minerals tablet 1 tablet  1 tablet Oral Daily Lovell Sheehan, MD       ondansetron [facility]) tablet 4 mg  4 mg Oral Q6H PRN Lovell Sheehan, MD       Or   ondansetron [facility]) injection 4 mg  4 mg Intravenous Q6H PRN Lovell Sheehan, MD       QUEtiapine (SEROQUEL) tablet 25 mg  25 mg Oral QHS Lovell Sheehan, MD   25 mg at 12/04/22 2149   senna-docusate (Senokot-S) tablet 1 tablet  1 tablet Oral QHS PRN Lovell Sheehan, MD       sertraline (ZOLOFT) tablet 25 mg   25 mg Oral QHS Lovell Sheehan, MD   25 mg at 12/04/22 2149     Discharge Medications: Please see discharge summary for a list of discharge medications.  Relevant Imaging Results:  Relevant Lab Results:   Additional Information [id number]  Conception Oms, RN

## 2022-12-05 NOTE — Care Management Important Message (Signed)
Important Message  Patient Details  Name: Michele [name] MRN: [id number] Date of Birth: 09/20/1924   Medicare Important Message Given:  Other (see comment)  Disposition to discharge with hospice services.  Medicare IM withheld at this time out of respect for patient and family.     Dannette Barbara 12/05/2022, 5:36 PM

## 2022-12-05 NOTE — Progress Notes (Signed)
Initial Nutrition Assessment  DOCUMENTATION CODES:   Underweight  INTERVENTION:   -Ensure Enlive po BID, each supplement provides 350 kcal and 20 grams of protein -MVI with minerals daily -Continue regular diet for widest variety of meal selections  NUTRITION DIAGNOSIS:   Increased nutrient needs related to post-op healing as evidenced by estimated needs.  GOAL:   Patient will meet greater than or equal to 90% of their needs  MONITOR:   PO intake, Supplement acceptance  REASON FOR ASSESSMENT:   Consult Assessment of nutrition requirement/status, Hip fracture protocol  ASSESSMENT:   Pt with history of right parietal occipital infarct, anxiety, PAF of AC due to chronic anemia , Anemia , memory deficit,  depression, hypertension, large hiatal hernia, diverticulosis, who presents s/p slip and fall on wet surface while walking with walker.  Pt admitted with lt femur fracture s/p fall.   1/31- s/p PROCEDURE:  LEFT INTRAMEDULLARY (IM) NAIL FEMORAL    Reviewed I/O's: +1.8 L x 24 hours and +2.8 L since admission  UOP: 1.4 L x 24 hours  Pt unavailable at time of visit. RD unable to obtain further nutrition-related history or complete nutrition-focused physical exam at this time.    Pt is followed by hospice PTA.   Pt currently on a regular diet. No meal completion data available to assess at this time.   Reviewed wt hx; pt has experienced a 10% wt loss over the past 6 months, which is significant for time frame.   Guven significant wt loss, advanced age, and underweight, suspect pt with malnutrition, however, unable to identify at this time. Pt would greatly benefit from addition of oral nutrition supplements.   Pt awaiting rehab evaluations for recommendations on discharge plan.   Medications reviewed and include colace and 0.9% sodium chloride infusion @ 75 ml/hr.   Labs reviewed: Na: 129, Mg: 1.6, CBGS: 101.    Diet Order:   Diet Order             DIET DYS 3 Room  service appropriate? Yes; Fluid consistency: Thin  Diet effective now                   EDUCATION NEEDS:   No education needs have been identified at this time  Skin:  Skin Assessment: Skin Integrity Issues: Skin Integrity Issues:: Incisions Incisions: closed lt hip  Last BM:  Unknown  Height:   Ht Readings from Last 1 Encounters:  12/04/22 '5\' 2"'$  (1.575 m)    Weight:   Wt Readings from Last 1 Encounters:  12/04/22 45 kg    Ideal Body Weight:  50 kg  BMI:  Body mass index is 18.15 kg/m.  Estimated Nutritional Needs:   Kcal:  1350-1550  Protein:  65-80 grams  Fluid:  > 1.3 L    Loistine Chance, RD, LDN, [place] Registered Dietitian II Certified Diabetes Care and Education Specialist Please refer to [facility] for RD and/or RD on-call/weekend/after hours pager

## 2022-12-05 NOTE — NC FL2 (Signed)
[place] LEVEL OF CARE FORM     IDENTIFICATION  Patient Name: Michele [name] Birthdate: 05/19/1924 Sex: female Admission Date (Current Location): 12/03/2022  [facility] and [place] Number:  Engineering geologist and Address:  [facility], [address] [place], [place] [address]      Provider Number: [id number]  Attending Physician Name and Address:  Gwynne Edinger, MD  Relative Name and Phone Number:  Edd Fabian Daughter [phone]    Current Level of Care: Hospital Recommended Level of Care: [place] Prior Approval Number:    Date Approved/Denied:   PASRR Number: [id number]  Discharge Plan: Other (Comment) (ALF)    Current Diagnoses: Patient Active Problem List   Diagnosis Date Noted   Fracture, hip, left, closed, initial encounter ([place]) 12/04/2022   S/p left hip fracture 12/03/2022   Colitis    Abdominal pain 06/29/2022   AKI (acute kidney injury) ([place]) 06/29/2022   Diarrhea 06/29/2022   PAF (paroxysmal atrial fibrillation) ([place]) 06/29/2022   Failure to thrive in adult 06/29/2022   Leukocytosis 06/29/2022   CVA (cerebral vascular accident) ([place]) 02/14/2022   Depression 02/14/2022   Moderate late onset Alzheimer's dementia ([place]) 10/03/2021   Stage 3a chronic kidney disease ([place]) 06/12/2021   Senile purpura ([place]) 10/14/2019   Nail, injury by 08/30/2019   Foot ulcer ([place]) 08/30/2019   Mild aortic valve stenosis 05/03/2019   Arthritis of both knees 03/31/2019   Chronic CHF ([place]) 03/31/2019   Myocardial infarction ([place]) 03/31/2019   Osteoporosis, 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/31/2019   Rheumatoid arthritis, unspecified ([place]) 03/31/2019   Situational anxiety 03/31/2019   Essential hypertension, benign 08/13/2018   Age related osteoporosis 07/13/2018   Closed subcapital fracture of neck of right femur, initial encounter ([place]) 07/08/2018   Bradycardia 12/29/2017   Pneumonia 11/06/2017   Hyperlipidemia, mixed 06/30/2017    Cerebral aneurysm without rupture 06/23/2017   Bundle branch block, left 12/19/2016   Vitamin D deficiency 12/19/2016   PSVT (paroxysmal supraventricular tachycardia) 11/25/2016   B12 deficiency 06/19/2016   H/O: CVA (cerebrovascular accident) 03/01/2016   Spondylosis of cervical region without myelopathy or radiculopathy 09/13/2015   Primary osteoarthritis of both knees 03/14/2015   Arthropathy, lower leg 10/16/2014   DDD (degenerative disc disease), lumbar 04/11/2014   Lumbar radiculitis 04/11/2014    Orientation RESPIRATION BLADDER Height & Weight     Self, Situation, Place  Normal Continent, External catheter Weight: 45 kg Height:  '5\' 2"'$  (157.5 cm)  BEHAVIORAL SYMPTOMS/MOOD NEUROLOGICAL BOWEL NUTRITION STATUS      Continent Diet (See DC summary)  AMBULATORY STATUS COMMUNICATION OF NEEDS Skin   Extensive Assist Verbally Normal, Surgical wounds                       Personal Care Assistance Level of Assistance  Bathing, Feeding, Dressing Bathing Assistance: Maximum assistance Feeding assistance: Limited assistance Dressing Assistance: Maximum assistance     Functional Limitations Info             SPECIAL CARE FACTORS FREQUENCY  PT (By licensed PT), OT (By licensed OT)     PT Frequency: 5 times per week OT Frequency: 5 times per week            Contractures      Additional Factors Info  Code Status, Allergies Code Status Info: DNR Allergies Info: Penicillins           Current Medications (12/05/2022):  This is the current hospital active  medication list Current Facility-Administered Medications  Medication Dose Route Frequency Provider Last Rate Last Admin   0.9 %  sodium chloride infusion (Manually program via Guardrails IV Fluids)   Intravenous Once Lovell Sheehan, MD   Held at 12/04/22 0831   0.9 %  sodium chloride infusion   Intravenous Continuous Sharion Settler, NP 75 mL/hr at 12/05/22 0654 Rate Change at 12/05/22 0654   ALPRAZolam  (XANAX) tablet 0.25 mg  0.25 mg Oral BID PRN Lovell Sheehan, MD       busPIRone (BUSPAR) tablet 10 mg  10 mg Oral BID Lovell Sheehan, MD   10 mg at 12/04/22 2149   ceFAZolin (ANCEF) 1-4 GM/50ML-% IVPB            ceFAZolin (ANCEF) IVPB 1 g/50 mL premix  1 g Intravenous Q6H Lovell Sheehan, MD   Stopped at 12/05/22 0457   diphenhydrAMINE (BENADRYL) 12.5 MG/5ML elixir 12.5-25 mg  12.5-25 mg Oral Q4H PRN Lovell Sheehan, MD       docusate sodium (COLACE) 100 MG capsule            docusate sodium (COLACE) capsule 100 mg  100 mg Oral BID Lovell Sheehan, MD       enoxaparin (LOVENOX) injection 30 mg  30 mg Subcutaneous Q24H Wouk, Ailene Rud, MD       feeding supplement (ENSURE ENLIVE / ENSURE PLUS) liquid 237 mL  237 mL Oral BID BM Lovell Sheehan, MD       HYDROcodone-acetaminophen (NORCO/VICODIN) 5-325 MG per tablet 1-2 tablet  1-2 tablet Oral Q6H PRN Lovell Sheehan, MD   1 tablet at 12/04/22 0951   methocarbamol (ROBAXIN) tablet 500 mg  500 mg Oral Q6H PRN Lovell Sheehan, MD       Or   methocarbamol (ROBAXIN) 500 mg in dextrose 5 % 50 mL IVPB  500 mg Intravenous Q6H PRN Lovell Sheehan, MD   Stopped at 12/04/22 2124   metoCLOPramide (REGLAN) tablet 5-10 mg  5-10 mg Oral Q8H PRN Lovell Sheehan, MD       Or   metoCLOPramide (REGLAN) injection 5-10 mg  5-10 mg Intravenous Q8H PRN Lovell Sheehan, MD       morphine (PF) 2 MG/ML injection 0.5 mg  0.5 mg Intravenous Q2H PRN Lovell Sheehan, MD   0.5 mg at 12/04/22 0123   multivitamin with minerals tablet 1 tablet  1 tablet Oral Daily Lovell Sheehan, MD       ondansetron [facility]) tablet 4 mg  4 mg Oral Q6H PRN Lovell Sheehan, MD       Or   ondansetron [name]) injection 4 mg  4 mg Intravenous Q6H PRN Lovell Sheehan, MD       QUEtiapine (SEROQUEL) tablet 25 mg  25 mg Oral QHS Lovell Sheehan, MD   25 mg at 12/04/22 2149   senna-docusate (Senokot-S) tablet 1 tablet  1 tablet Oral QHS PRN Lovell Sheehan, MD       sertraline (ZOLOFT) tablet 25 mg   25 mg Oral QHS Lovell Sheehan, MD   25 mg at 12/04/22 2149     Discharge Medications: Please see discharge summary for a list of discharge medications.  Relevant Imaging Results:  Relevant Lab Results:   Additional Information [id number]  Conception Oms, RN

## 2022-12-05 NOTE — NC FL2 (Signed)
[place] LEVEL OF CARE FORM     IDENTIFICATION  Patient Name: Michele [name] Birthdate: 11/17/1923 Sex: female Admission Date (Current Location): 12/03/2022  [facility] and [place] Number:  Engineering geologist and Address:  [facility], [address], [place], [place] [address]      Provider Number: [id number]  Attending Physician Name and Address:  Gwynne Edinger, MD  Relative Name and Phone Number:  Edd Fabian Daughter [phone]    Current Level of Care: Hospital Recommended Level of Care: [place] Prior Approval Number:    Date Approved/Denied:   PASRR Number: [id number]  Discharge Plan: SNF    Current Diagnoses: Patient Active Problem List   Diagnosis Date Noted   Fracture, hip, left, closed, initial encounter ([place]) 12/04/2022   S/p left hip fracture 12/03/2022   Colitis    Abdominal pain 06/29/2022   AKI (acute kidney injury) ([place]) 06/29/2022   Diarrhea 06/29/2022   PAF (paroxysmal atrial fibrillation) ([place]) 06/29/2022   Failure to thrive in adult 06/29/2022   Leukocytosis 06/29/2022   CVA (cerebral vascular accident) ([place]) 02/14/2022   Depression 02/14/2022   Moderate late onset Alzheimer's dementia ([place]) 10/03/2021   Stage 3a chronic kidney disease ([place]) 06/12/2021   Senile purpura ([place]) 10/14/2019   Nail, injury by 08/30/2019   Foot ulcer ([place]) 08/30/2019   Mild aortic valve stenosis 05/03/2019   Arthritis of both knees 03/31/2019   Chronic CHF ([place]) 03/31/2019   Myocardial infarction ([place]) 03/31/2019   Osteoporosis, 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/31/2019   Rheumatoid arthritis, unspecified ([place]) 03/31/2019   Situational anxiety 03/31/2019   Essential hypertension, benign 08/13/2018   Age related osteoporosis 07/13/2018   Closed subcapital fracture of neck of right femur, initial encounter ([place]) 07/08/2018   Bradycardia 12/29/2017   Pneumonia 11/06/2017   Hyperlipidemia, mixed 06/30/2017   Cerebral  aneurysm without rupture 06/23/2017   Bundle branch block, left 12/19/2016   Vitamin D deficiency 12/19/2016   PSVT (paroxysmal supraventricular tachycardia) 11/25/2016   B12 deficiency 06/19/2016   H/O: CVA (cerebrovascular accident) 03/01/2016   Spondylosis of cervical region without myelopathy or radiculopathy 09/13/2015   Primary osteoarthritis of both knees 03/14/2015   Arthropathy, lower leg 10/16/2014   DDD (degenerative disc disease), lumbar 04/11/2014   Lumbar radiculitis 04/11/2014    Orientation RESPIRATION BLADDER Height & Weight     Self, Situation, Place  Normal Continent, External catheter Weight: 45 kg Height:  '5\' 2"'$  (157.5 cm)  BEHAVIORAL SYMPTOMS/MOOD NEUROLOGICAL BOWEL NUTRITION STATUS      Continent Diet (See DC summary)  AMBULATORY STATUS COMMUNICATION OF NEEDS Skin   Extensive Assist Verbally Normal, Surgical wounds                       Personal Care Assistance Level of Assistance  Bathing, Feeding, Dressing Bathing Assistance: Maximum assistance Feeding assistance: Limited assistance Dressing Assistance: Maximum assistance     Functional Limitations Info             SPECIAL CARE FACTORS FREQUENCY  PT (By licensed PT), OT (By licensed OT)     PT Frequency: 5 times per week OT Frequency: 5 times per week            Contractures      Additional Factors Info  Code Status, Allergies Code Status Info: DNR Allergies Info: Penicillins           Current Medications (12/05/2022):  This is the current hospital active medication list  Current Facility-Administered Medications  Medication Dose Route Frequency Provider Last Rate Last Admin   0.9 %  sodium chloride infusion (Manually program via Guardrails IV Fluids)   Intravenous Once Lovell Sheehan, MD   Held at 12/04/22 0831   0.9 %  sodium chloride infusion   Intravenous Continuous Sharion Settler, NP 75 mL/hr at 12/05/22 0654 Rate Change at 12/05/22 0654   ALPRAZolam (XANAX) tablet  0.25 mg  0.25 mg Oral BID PRN Lovell Sheehan, MD       busPIRone (BUSPAR) tablet 10 mg  10 mg Oral BID Lovell Sheehan, MD   10 mg at 12/04/22 2149   ceFAZolin (ANCEF) IVPB 1 g/50 mL premix  1 g Intravenous Q6H Lovell Sheehan, MD   Stopped at 12/05/22 0457   diphenhydrAMINE (BENADRYL) 12.5 MG/5ML elixir 12.5-25 mg  12.5-25 mg Oral Q4H PRN Lovell Sheehan, MD       docusate sodium (COLACE) capsule 100 mg  100 mg Oral BID Lovell Sheehan, MD       enoxaparin (LOVENOX) injection 30 mg  30 mg Subcutaneous Q24H Wouk, Ailene Rud, MD       feeding supplement (ENSURE ENLIVE / ENSURE PLUS) liquid 237 mL  237 mL Oral BID BM Lovell Sheehan, MD       HYDROcodone-acetaminophen (NORCO/VICODIN) 5-325 MG per tablet 1-2 tablet  1-2 tablet Oral Q6H PRN Lovell Sheehan, MD   1 tablet at 12/04/22 0951   methocarbamol (ROBAXIN) tablet 500 mg  500 mg Oral Q6H PRN Lovell Sheehan, MD       Or   methocarbamol (ROBAXIN) 500 mg in dextrose 5 % 50 mL IVPB  500 mg Intravenous Q6H PRN Lovell Sheehan, MD   Stopped at 12/04/22 2124   metoCLOPramide (REGLAN) tablet 5-10 mg  5-10 mg Oral Q8H PRN Lovell Sheehan, MD       Or   metoCLOPramide (REGLAN) injection 5-10 mg  5-10 mg Intravenous Q8H PRN Lovell Sheehan, MD       morphine (PF) 2 MG/ML injection 0.5 mg  0.5 mg Intravenous Q2H PRN Lovell Sheehan, MD   0.5 mg at 12/04/22 0123   multivitamin with minerals tablet 1 tablet  1 tablet Oral Daily Lovell Sheehan, MD       ondansetron [facility]) tablet 4 mg  4 mg Oral Q6H PRN Lovell Sheehan, MD       Or   ondansetron [facility]) injection 4 mg  4 mg Intravenous Q6H PRN Lovell Sheehan, MD       QUEtiapine (SEROQUEL) tablet 25 mg  25 mg Oral QHS Lovell Sheehan, MD   25 mg at 12/04/22 2149   senna-docusate (Senokot-S) tablet 1 tablet  1 tablet Oral QHS PRN Lovell Sheehan, MD       sertraline (ZOLOFT) tablet 25 mg  25 mg Oral QHS Lovell Sheehan, MD   25 mg at 12/04/22 2149     Discharge Medications: Please see discharge  summary for a list of discharge medications.  Relevant Imaging Results:  Relevant Lab Results:   Additional Information [id number]  Conception Oms, RN

## 2022-12-05 NOTE — TOC Progression Note (Addendum)
Transition of Care [facility]) - Progression Note    Patient Details  Name: Michele [name] MRN: [id number] Date of Birth: 04/18/1924  [facility] CM/SW [place], RN Phone Number: 12/05/2022, 9:37 AM  Clinical Narrative:   Spoke with the patient's daughter Edd Fabian She confirmed that the patient has been a resident at [facility] and plans to return to [facility] after Rehab, she has 24 hour caregivers, The patient will be followed by [facility] for Hospice, I explained that the patient would not be able to have Hospice and go to rehab at the same time typically, She stated that they would revoke Hospice and return with them after rehab. She stated that she would let [facility] know She is agreeable to a bed search and prefers [facility], I explained that I would attempt to get a bed there but may not be able to, she is agreeable to other places if not        Expected Discharge Plan and Services                                               Social Determinants of Health (SDOH) Interventions SDOH Screenings   Food Insecurity: No Food Insecurity (12/04/2022)  Housing: Low Risk  (12/04/2022)  Transportation Needs: No Transportation Needs (12/04/2022)  Utilities: Not At Risk (12/04/2022)  Tobacco Use: Low Risk  (12/04/2022)    Readmission Risk Interventions     No data to display

## 2022-12-05 NOTE — Progress Notes (Addendum)
PROGRESS NOTE    Michele [name]  YHC:[id number] DOB: 1924-08-07 DOA: 12/03/2022 PCP: Baxter Hire, MD  Outpatient Specialists: cardiology    Brief Narrative:   From admission h and p  Michele [name] is a 89 y.o. female with medical history significant of  89 year old female with history of right parietal occipital infarct, anxiety, PAF of AC due to chronic anemia , Anemia , memory deficit,  depression, hypertension, large hiatal hernia, diverticulosis, who presents to ED s/p slip and fall on wet surface while walking with walker at [facility]. Patient currently states she has no pain. She notes no sob/chest pain / n/v/diarrhea, but appears to have complaint of mild dysuria.  She is not able to give full history of event. Event details taken from chart.   Assessment & Plan:   Principal Problem:   S/p left hip fracture Active Problems:   Essential hypertension, benign   Age related osteoporosis   Chronic CHF ([place])   H/O: CVA (cerebrovascular accident)   PAF (paroxysmal atrial fibrillation) (HCC)   Stage 3a chronic kidney disease (HCC)   Moderate late onset Alzheimer's dementia ([place])   Fracture, hip, left, closed, initial encounter ([place])  # Left intertrochanteric femur fracture After mechanical fall at home. CT head nothing acute. S/p intramedullary nail operative repair on 1/31 w/ Dr. Harlow Mares - pain control, bowel regimen - lovenox dvt ppx - family wants to return to [facility] with hospice, will plan on that tomorrow  # Anemia Chronic normocytic hgb 10 on arrival but baseline close to 8, was 7.9 yesterday. No report of melena or other bleeding. Transfused one unit 1/31, today post-op hgb is 9.5. Iron studies suggest baseline anemia of chronic disease - monitor  # HTN currently bp low normal, received fluids overnight for post-op hypotension - holding home meds (ramipril, spironolactone, metoprolol). Given daughter's wishe  # Hyponatremia Chronic, baseline appears to be low 130s,  here 129. Tsh wnl. Not volume overloaded. Labs suggest siadh - holding home spiro as above. Sertraline may also contribute - f/u am cortisol  # Urinary retention AM 1/31 unable to void and bladder scan showed 400 ml. Foley placed, I/o cath doesn't appear was tried.  - will d/c foley, monitor for retention  # Hx CVA Hospitalized earlier this year, CT head shows more recent infarcts as well, nothing acute - not on secondary prevention meds given comfort-based approach  # Dementia # Hospice # [facility], has home health aides 24/7, lives in assisted living, also followed by hospice. Daughter says approach is primarily comfort, treating what's easily treatable, not extending life - cont home seroquel qhs, xanax prn, sertraline  # A-fib Rate controlled. At last cardiology visit was on apixaban but discontinued given comfort based approach - resume metop when bp improves  # Aortic stenosis Moderate. Appears compensated - monitor   DVT prophylaxis: SCDs Code Status: dnr Family Communication: daughter Edd Fabian updated telephonically 2/1  Level of care: Med-Surg Status is: Inpatient Remains inpatient appropriate because: severity of illness    Consultants:  ortho  Procedures: Left femur IM nail 1/31  Antimicrobials:  Peri-operative    Subjective: Tolerated surgery, confused  Objective: Vitals:   12/05/22 0043 12/05/22 0240 12/05/22 0358 12/05/22 0507  BP: 103/66 (!) 101/51 95/60 106/60  Pulse: 75 76  68  Resp:      Temp:    97.8 F (36.6 C)  TempSrc:      SpO2: 98% 97% 96% 95%  Weight:  Height:        Intake/Output Summary (Last 24 hours) at 12/05/2022 0842 Last data filed at 12/05/2022 0506 Gross per 24 hour  Intake 3175 ml  Output 1410 ml  Net 1765 ml   Filed Weights   12/03/22 1646 12/04/22 1519  Weight: 45 kg 45 kg    Examination:  General exam: mildly uncomfortable  Respiratory system: Clear to auscultation. Respiratory effort  normal. Cardiovascular system: S1 & S2 heard, irreg, mod systolic murmur Gastrointestinal system: Abdomen is nondistended, soft and nontender. No organomegaly or masses felt. Normal bowel sounds heard. Central nervous system: awake and alert oriented to self Extremities: 1+ LE edema, left left lateral bandage Skin: No rashes, lesions or ulcers Psychiatry: confused    Data Reviewed: I have personally reviewed following labs and imaging studies  CBC: Recent Labs  Lab 12/03/22 1818 12/04/22 0539 12/05/22 0055 12/05/22 0648  WBC 18.2* 9.2  9.2  --  8.7  NEUTROABS 15.8* 7.3  --   --   HGB 10.0* 8.0*  7.9* 9.7* 9.5*  HCT 31.4* 25.5*  25.4* 29.4* 28.7*  MCV 85.1 86.4  86.4  --  86.4  PLT 322 264  241  --  638   Basic Metabolic Panel: Recent Labs  Lab 12/03/22 1818 12/03/22 2144 12/04/22 0539 12/05/22 0648  NA 127* 128* 129* 129*  K 4.4 4.3 4.5 4.1  CL 96* 98 101 103  CO2 22 21* 23 21*  GLUCOSE 117* 110* 109* 111*  BUN '18 16 15 13  '$ CREATININE 0.93 0.91 0.73 0.70  CALCIUM 8.7* 8.4* 8.1* 7.9*   GFR: Estimated Creatinine Clearance: 29.2 mL/min (by C-G formula based on SCr of 0.7 mg/dL). Liver Function Tests: Recent Labs  Lab 12/03/22 1818  AST 19  ALT 12  ALKPHOS 72  BILITOT 0.5  PROT 6.0*  ALBUMIN 3.3*   No results for input(s): "LIPASE", "AMYLASE" in the last 168 hours. No results for input(s): "AMMONIA" in the last 168 hours. Coagulation Profile: Recent Labs  Lab 12/03/22 1818  INR 1.1   Cardiac Enzymes: No results for input(s): "CKTOTAL", "CKMB", "CKMBINDEX", "TROPONINI" in the last 168 hours. BNP (last 3 results) No results for input(s): "PROBNP" in the last 8760 hours. HbA1C: No results for input(s): "HGBA1C" in the last 72 hours. CBG: Recent Labs  Lab 12/05/22 0151  GLUCAP 101*   Lipid Profile: No results for input(s): "CHOL", "HDL", "LDLCALC", "TRIG", "CHOLHDL", "LDLDIRECT" in the last 72 hours. Thyroid Function Tests: Recent Labs     12/04/22 0539  TSH 1.613   Anemia Panel: Recent Labs    12/04/22 0539  FERRITIN 17  TIBC 255  IRON 93   Urine analysis:    Component Value Date/Time   COLORURINE STRAW (A) 12/03/2022 2050   APPEARANCEUR CLEAR (A) 12/03/2022 2050   LABSPEC 1.010 12/03/2022 2050   PHURINE 7.0 12/03/2022 2050   GLUCOSEU NEGATIVE 12/03/2022 2050   [place] NEGATIVE 12/03/2022 2050   [place] NEGATIVE 12/03/2022 2050   [place] 12/03/2022 2050   PROTEINUR NEGATIVE 12/03/2022 2050   NITRITE NEGATIVE 12/03/2022 2050   LEUKOCYTESUR SMALL (A) 12/03/2022 2050   Sepsis Labs: '@LABRCNTIP'$ (procalcitonin:4,lacticidven:4)  ) Recent Results (from the past 240 hour(s))  Resp panel by RT-PCR (RSV, Flu A&B, Covid) Anterior Nasal Swab     Status: None   Collection Time: 12/03/22  6:47 PM   Specimen: Anterior Nasal Swab  Result Value Ref Range Status   SARS Coronavirus 2 by RT PCR NEGATIVE NEGATIVE Final  Comment: (NOTE) SARS-CoV-2 target nucleic acids are NOT DETECTED.  The SARS-CoV-2 RNA is generally detectable in upper respiratory specimens during the acute phase of infection. The lowest concentration of SARS-CoV-2 viral copies this assay can detect is 138 copies/mL. A negative result does not preclude SARS-Cov-2 infection and should not be used as the sole basis for treatment or other patient management decisions. A negative result may occur with  improper specimen collection/handling, submission of specimen other than nasopharyngeal swab, presence of viral mutation(s) within the areas targeted by this assay, and inadequate number of viral copies(<138 copies/mL). A negative result must be combined with clinical observations, patient history, and epidemiological information. The expected result is Negative.  Fact Sheet for Patients:  EntrepreneurPulse.com.au  Fact Sheet for Healthcare Providers:  IncredibleEmployment.be  This test is no t yet  approved or cleared by the [place] FDA and  has been authorized for detection and/or diagnosis of SARS-CoV-2 by FDA under an Emergency Use Authorization (EUA). This EUA will remain  in effect (meaning this test can be used) for the duration of the COVID-19 declaration under Section 564(b)(1) of the Act, 21 U.S.C.section 360bbb-3(b)(1), unless the authorization is terminated  or revoked sooner.       Influenza A by PCR NEGATIVE NEGATIVE Final   Influenza B by PCR NEGATIVE NEGATIVE Final    Comment: (NOTE) The Xpert Xpress SARS-CoV-2/FLU/RSV plus assay is intended as an aid in the diagnosis of influenza from Nasopharyngeal swab specimens and should not be used as a sole basis for treatment. Nasal washings and aspirates are unacceptable for Xpert Xpress SARS-CoV-2/FLU/RSV testing.  Fact Sheet for Patients: EntrepreneurPulse.com.au  Fact Sheet for Healthcare Providers: IncredibleEmployment.be  This test is not yet approved or cleared by the [place] FDA and has been authorized for detection and/or diagnosis of SARS-CoV-2 by FDA under an Emergency Use Authorization (EUA). This EUA will remain in effect (meaning this test can be used) for the duration of the COVID-19 declaration under Section 564(b)(1) of the Act, 21 U.S.C. section 360bbb-3(b)(1), unless the authorization is terminated or revoked.     Resp Syncytial Virus by PCR NEGATIVE NEGATIVE Final    Comment: (NOTE) Fact Sheet for Patients: EntrepreneurPulse.com.au  Fact Sheet for Healthcare Providers: IncredibleEmployment.be  This test is not yet approved or cleared by the [place] FDA and has been authorized for detection and/or diagnosis of SARS-CoV-2 by FDA under an Emergency Use Authorization (EUA). This EUA will remain in effect (meaning this test can be used) for the duration of the COVID-19 declaration under Section 564(b)(1) of  the Act, 21 U.S.C. section 360bbb-3(b)(1), unless the authorization is terminated or revoked.  Performed at [facility], [address] [place], [place] [address]          Radiology Studies: DG HIP UNILAT WITH PELVIS 2-3 VIEWS LEFT  Result Date: 12/04/2022 CLINICAL DATA:  Status post ORIF of the left hip. EXAM: DG HIP (WITH OR WITHOUT PELVIS) 2-3V LEFT COMPARISON:  12/03/2022 FINDINGS: Six images obtained via portable C-arm radiography in the operating room were submitted. Images demonstrate open reduction and internal fixation of the comminuted intertrochanteric fracture of the left femur. Placement of IM nail and hip screw noted. IMPRESSION: Status post ORIF of comminuted intertrochanteric fracture of the proximal left femur. Electronically Signed   By: Kerby Moors M.D.   On: 12/04/2022 18:50   DG C-Arm 1-60 Min-No Report  Result Date: 12/04/2022 Fluoroscopy was utilized by the requesting physician.  No radiographic interpretation.  CT HEAD WO CONTRAST (5MM)  Result Date: 12/03/2022 CLINICAL DATA:  Fall EXAM: CT HEAD WITHOUT CONTRAST TECHNIQUE: Contiguous axial images were obtained from the base of the skull through the vertex without intravenous contrast. RADIATION DOSE REDUCTION: This exam was performed according to the departmental dose-optimization program which includes automated exposure control, adjustment of the mA and/or kV according to patient size and/or use of iterative reconstruction technique. COMPARISON:  06/29/2022 FINDINGS: Brain: No evidence of acute infarction, hemorrhage, mass, mass effect, or midline shift. No hydrocephalus or extra-axial fluid collection. Redemonstrated remote infarcts in right parieto-occipital region and left greater than right cerebellar hemisphere, with interval development of additional chronic appearing infarcts in left occipital lobe (series 4, image 53) and bilateral more superior cerebellar hemispheres (series 3, image 8 and  series 4, image 50-53). Periventricular white matter changes, likely the sequela of chronic small vessel ischemic disease. Vascular: No hyperdense vessel. Atherosclerotic calcifications in the intracranial carotid and vertebral arteries. Skull: Negative for fracture or focal lesion. Sinuses/Orbits: Mucosal thickening in the ethmoid air cells. No acute finding in the orbits. Status post left lens replacement. Other: The mastoid air cells are well aerated. IMPRESSION: 1. No acute intracranial process. 2. Since 06/29/2022, interval development of chronic appearing infarcts in the left occipital lobe and bilateral superior cerebellar hemispheres. Electronically Signed   By: Merilyn Baba M.D.   On: 12/03/2022 18:13   DG Chest 1 View  Result Date: 12/03/2022 CLINICAL DATA:  Fall today.  Left hip fracture. EXAM: CHEST  1 VIEW COMPARISON:  [place] 02/14/2022 and 07/08/2018. CT chest 11/08/2017 and abdomen 06/29/2022. FINDINGS: 1704 hours. Patient is rotated to the left. There are low lung volumes. Stable cardiomegaly, aortic atherosclerosis and a small hiatal hernia. Volume loss at the left lung base and associated atelectasis are similar to recent CT. There are nonacute appearing left rib fractures. Multiple thoracolumbar compression deformities are grossly unchanged. IMPRESSION: No evidence of acute chest injury. Chronic left basilar atelectasis as seen on recent CT. Electronically Signed   By: Richardean Sale M.D.   On: 12/03/2022 17:35   DG Hip Unilat W or Wo Pelvis 2-3 Views Left  Result Date: 12/03/2022 CLINICAL DATA:  Fall today when ambulating with walker at nursing facility. EXAM: DG HIP (WITH OR WITHOUT PELVIS) 2-3V LEFT COMPARISON:  None Available. FINDINGS: AP pelvis with AP and frog-leg lateral views of the left hip. The bones are diffusely demineralized. There is a comminuted and displaced intertrochanteric left femur fracture. No evidence of dislocation or acute pelvic fracture. Previous right femoral  neck pinning noted. There are degenerative changes within the visualized lower lumbar spine. IMPRESSION: Comminuted and displaced intertrochanteric left femur fracture. Electronically Signed   By: Richardean Sale M.D.   On: 12/03/2022 17:32        Scheduled Meds:  sodium chloride   Intravenous Once   busPIRone  10 mg Oral BID   docusate sodium  100 mg Oral BID   QUEtiapine  25 mg Oral QHS   sertraline  25 mg Oral QHS   Continuous Infusions:  sodium chloride 75 mL/hr at 12/05/22 0654    ceFAZolin (ANCEF) IV Stopped (12/05/22 0457)   methocarbamol (ROBAXIN) IV Stopped (12/04/22 2124)     LOS: 2 days     Desma Maxim, MD [facility]   If 7PM-7AM, please contact night-coverage [email or web address] Password [facility] 12/05/2022, 8:42 AM

## 2022-12-05 NOTE — Progress Notes (Signed)
[facility]) Hospitalized Hospice Patient   [name] is an active [facility] patient with a terminal diagnosis of cerebrovascular disease. Patient was ambulating through her facility had a fall and almost immediately voiced hip pain. Patient transferred to [facility] after fall for further examination; [facility] staff were aware. Patient admitted to [facility] on 1.30.24 with diagnosis of left hip fracture. Per Dr. Karie Georges, [facility]  provider, this is a related hospital admission.    Patient has been moved from ER, and when visited at bedside, patient very vocal of ongoing pain. Caregiver present at bedside and shares that patient has been voicing pain for quite some time and that [facility] staff are working diligently to address discomfort. Caregiver reports that staff have added ice bags to provide additional comfort. MSW voiced appreciation for additional information. During time of visit, patient housed in post-op Wing and plan is to be transferred to the main floor. As per MD, Dr. Si Raider, patient may be a candidate for discharge on 2.2.   Patient is inpatient appropriate following IM nailing of left hip fracture and continues to require skilled LOC.   Vital Signs: Vitals:   12/05/22 1709 12/05/22 1716  BP: (!) 86/61 91/78  Pulse: 73   Resp: 20   Temp: 98.7 F (37.1 C)   SpO2: 95%    I&O: Intake/Output Summary (Last 24 hours) at 12/05/2022 1806 Last data filed at 12/05/2022 1643 Gross per 24 hour  Intake 2905 ml  Output 1035 ml  Net 1870 ml   Abnormal Labs:  Latest Reference Range & Units 12/05/22 06:48  Sodium 135 - 145 mmol/L 129 (L)  CO2 22 - 32 mmol/L 21 (L)  Glucose 70 - 99 mg/dL 111 (H)  Calcium 8.9 - 10.3 mg/dL 7.9 (L)  Osmolality 275 - 295 mOsm/kg 274 (L)  RBC 3.87 - 5.11 MIL/uL 3.32 (L)  Hemoglobin 12.0 - 15.0 g/dL 9.5 (L)  HCT 36.0 - 46.0 % 28.7 (L)  RDW 11.5 - 15.5 % 16.7 (H)   Diagnostics: None new to report    IV/PRN Meds: sodium chloride, Last Rate: 75 mL/hr at  12/05/22 1643 methocarbamol (ROBAXIN) IV, Last Rate: Stopped (12/04/22 2124)  ALPRAZolam, 0.25 mg, BID PRN diphenhydrAMINE, 12.5-25 mg, Q4H PRN HYDROcodone-acetaminophen, 1-2 tablet, Q6H PRN methocarbamol, 500 mg, Q6H PRN  Or methocarbamol (ROBAXIN) IV, 500 mg, Q6H PRN metoCLOPramide, 5-10 mg, Q8H PRN  Or metoCLOPramide (REGLAN) injection, 5-10 mg, Q8H PRN  morphine injection, 0.5 mg, Q2H PRN ondansetron, 4 mg, Q6H PRN  Or ondansetron (ZOFRAN) IV, 4 mg, Q6H PRN senna-docusate, 1 tablet, QHS PRN  Problem List: Principal Problem:   S/p left hip fracture Active Problems:   Essential hypertension, benign   Age related osteoporosis   Chronic CHF ([place])   H/O: CVA (cerebrovascular accident)   PAF (paroxysmal atrial fibrillation) (HCC)   Stage 3a chronic kidney disease (HCC)   Moderate late onset Alzheimer's dementia ([place])   Fracture, hip, left, closed, initial encounter [facility])   Discharge Planning: As per PCG, plan is for patient to return to facility with [facility] staff to follow w/ hospice services. Family briefly considering SNF rehab but opted to DC with hospice.   Family Contact: MSW spoke with daughter/Michele [name] via telephone call. Michele [name] is appreciative of MSW call and shares that she has chosen to continue with hospice services as she does not feel therapy would be beneficial/comfortable for patient.   IDT: Updated  Goals of Care:  Clear, family has opted to discharge with  Hospice services once patient medically stable for discharge.  Thank you for the opportunity to participate in this patient's care, please don't hesitate to call for any hospice related questions or concerns.   Phillis Haggis, MSW [facility] Liaison [phone]

## 2022-12-05 NOTE — Progress Notes (Signed)
Physical Therapy Treatment Patient Details Name: Michele [name] MRN: [id number] DOB: [birth year] Today's Date: 12/05/2022   History of Present Illness 89 y.o. female with medical history significant of   89 year old female with history of right parietal occipital infarct, anxiety, PAF of AC due to chronic anemia , Anemia , memory deficit,  depression, hypertension, large hiatal hernia, diverticulosis, who presents to ED s/p slip and fall on wet surface while walking with walker.  Found to have L hip fx and now s/p IM nailing 12/04/22.    PT Comments    Pt seen for PM session (now on ortho floor) but was clearly in more pain than this AM as well as having more confusion.  Caregiver present and reports that afternoons are typically this way.  Pt very pleasant, motivated and showed good effort with exercises but did not wish to try and get up again today when PT discussed sitting/standing.  Given her confusion, pain, groaning her wishes were respected.  Pt continues to need AAROM for some L LE exercises, less able to give resisted effort this afternoon.  Continue with POC per protocol as appropriate.   Recommendations for follow up therapy are one component of a multi-disciplinary discharge planning process, led by the attending physician.  Recommendations may be updated based on patient status, additional functional criteria and insurance authorization.  Follow Up Recommendations  Skilled nursing-short term rehab (<3 hours/day) Can patient physically be transported by private vehicle: No   Assistance Recommended at Discharge Frequent or constant Supervision/Assistance  Patient can return home with the following A lot of help with bathing/dressing/bathroom;Two people to help with walking and/or transfers;Assistance with cooking/housework;Assist for transportation;Help with stairs or ramp for entrance   Equipment Recommendations  None recommended by PT    Recommendations for Other Services        Precautions / Restrictions Precautions Precautions: Fall Restrictions Weight Bearing Restrictions: Yes LLE Weight Bearing: Weight bearing as tolerated     Mobility  Bed Mobility               General bed mobility comments: Pt groaning t/o the session, more so when discussing getting up to EOB and standing.  Pt requests not to, given her decreased mental status this afternoon and groaning with basic L LE exercises deferred mobility this afternoon - pt very grateful to not have to get up again this session    Transfers                        Ambulation/Gait                   Stairs             Wheelchair Mobility    Modified Rankin (Stroke Patients Only)       Balance                                            Cognition Arousal/Alertness: Awake/alert Behavior During Therapy: WFL for tasks assessed/performed Overall Cognitive Status: History of cognitive impairments - at baseline                                 General Comments: caregiver reports that she typically better in th morning, has some sundowning  Exercises General Exercises - Lower Extremity Ankle Circles/Pumps: AROM, 15 reps Quad Sets: Strengthening, 10 reps Short Arc Quad: AROM, AAROM, 10 reps (AAROM to attain TKE this afternoon) Heel Slides: AAROM, 10 reps (lightly resisted leg ext, c/o pain with all R hip movement) Hip ABduction/ADduction: AAROM, 10 reps (more assist needed this afternoon)    General Comments General comments (skin integrity, edema, etc.): Pt remains pleasant and motivated but less interactive and less able to tolerate exercises this afternoon      Pertinent Vitals/Pain Pain Assessment Pain Assessment: Faces Faces Pain Scale: Hurts whole lot Pain Location: with any L hip movement    Home Living                          Prior Function            PT Goals (current goals can now be found in the  care plan section) Progress towards PT goals: Progressing toward goals    Frequency    BID      PT Plan Current plan remains appropriate    Co-evaluation              AM-PAC PT "6 Clicks" Mobility   Outcome Measure  Help needed turning from your back to your side while in a flat bed without using bedrails?: A Lot Help needed moving from lying on your back to sitting on the side of a flat bed without using bedrails?: A Lot Help needed moving to and from a bed to a chair (including a wheelchair)?: A Lot Help needed standing up from a chair using your arms (e.g., wheelchair or bedside chair)?: A Lot Help needed to walk in hospital room?: Total Help needed climbing 3-5 steps with a railing? : Total 6 Click Score: 10    End of Session Equipment Utilized During Treatment: Gait belt Activity Tolerance: Patient tolerated treatment well Patient left: with call bell/phone within reach;with bed alarm set;with family/visitor present Nurse Communication: Mobility status PT Visit Diagnosis: Muscle weakness (generalized) (M62.81);Difficulty in walking, not elsewhere classified (R26.2);Pain Pain - Right/Left: Left Pain - part of body: Hip     Time: 0569-7948 PT Time Calculation (min) (ACUTE ONLY): 14 min  Charges:  $Therapeutic Exercise: 8-22 mins                     Kreg Shropshire, DPT 12/05/2022, 5:26 PM

## 2022-12-05 NOTE — Evaluation (Signed)
Physical Therapy Evaluation Patient Details Name: Michele [name] MRN: [id number] DOB: 1924-10-04 Today's Date: 12/05/2022  History of Present Illness  89 y.o. female with medical history significant of   89 year old female with history of right parietal occipital infarct, anxiety, PAF of AC due to chronic anemia , Anemia , memory deficit,  depression, hypertension, large hiatal hernia, diverticulosis, who presents to ED s/p slip and fall on wet surface while walking with walker.  Found to have L hip fx and now s/p IM nailing 12/04/22.  Clinical Impression  Pt pleasant and willing to participate, but did struggle with pain and confusion t/o the session.  She showed good effort with bed exercises, but was quite limited with most aspects of mobility.  Pt reports pain as minimal when asked at rest, but clearly with significant guarding/hesitation with essentially all movement.    Pt is far from her baseline and will require significant rehab, recommending STR at this time to address functional limitations.       Recommendations for follow up therapy are one component of a multi-disciplinary discharge planning process, led by the attending physician.  Recommendations may be updated based on patient status, additional functional criteria and insurance authorization.  Follow Up Recommendations Skilled nursing-short term rehab (<3 hours/day) Can patient physically be transported by private vehicle: No    Assistance Recommended at Discharge Frequent or constant Supervision/Assistance  Patient can return home with the following  A lot of help with bathing/dressing/bathroom;Two people to help with walking and/or transfers;Assistance with cooking/housework;Assist for transportation;Help with stairs or ramp for entrance    Equipment Recommendations    Recommendations for Other Services       Functional Status Assessment Patient has had a recent decline in their functional status and demonstrates the ability  to make significant improvements in function in a reasonable and predictable amount of time.     Precautions / Restrictions Precautions Precautions: Fall Restrictions Weight Bearing Restrictions: Yes LLE Weight Bearing: Weight bearing as tolerated      Mobility  Bed Mobility Overal bed mobility: Needs Assistance Bed Mobility: Supine to Sit     Supine to sit: Max assist     General bed mobility comments: Pt made some effort with getting to EOB but ultimately needed considerable assist to attain sitting, consistently leaning R  (away from L hip/WBing) in sitting    Transfers Overall transfer level: Needs assistance Equipment used: Rolling walker (2 wheels) Transfers: Sit to/from Stand Sit to Stand: Mod assist           General transfer comment: Cues for U&LE set up and plan for weight shift/sequencing.  Pt able to help some, but needed mod assist to get to standing    Ambulation/Gait Ambulation/Gait assistance: Max assist Gait Distance (Feet): 5 Feet Assistive device: Rolling walker (2 wheels)         General Gait Details: Pt was able to put together a few small side steps with heavy cuing and encouragement, however did not take any real ambulatory steps.  Good effort, needed constant cuing and assist to insure safety stepping to recliner  Stairs            Wheelchair Mobility    Modified Rankin (Stroke Patients Only)       Balance Overall balance assessment: Needs assistance Sitting-balance support: Bilateral upper extremity supported Sitting balance-Leahy Scale: Fair Sitting balance - Comments: leaning to the R (presumambly to keep weight off the L hip area) - struggled to find mid  line     Standing balance-Leahy Scale: Poor Standing balance comment: Once assisted up into the walker she was able to maintain static balance w/o direct assist, but all attempts at stepping/weight shifting, etc necessitated direct assist to maintain standing                              Pertinent Vitals/Pain Pain Assessment Pain Assessment: Faces Faces Pain Scale: Hurts even more    Home Living Family/patient expects to be discharged to:: Skilled nursing facility                   Additional Comments: Pt has 24/7 caregivers    Prior Function Prior Level of Function : Needs assist             Mobility Comments: Pt with 24/7 assist, out in community ~1/wk, supervision for all functional mobility with RW ADLs Comments: Pt able to perform sink bath with supervision and showers with assistance. She can dress herself.     Hand Dominance        Extremity/Trunk Assessment   Upper Extremity Assessment Upper Extremity Assessment: Generalized weakness    Lower Extremity Assessment Lower Extremity Assessment: Generalized weakness (expected post-op L LE hesitancy)       Communication   Communication: HOH  Cognition Arousal/Alertness: Awake/alert Behavior During Therapy: WFL for tasks assessed/performed Overall Cognitive Status: History of cognitive impairments - at baseline                                 General Comments: per caregiver she is "in and out" but essentially near her baseline        General Comments General comments (skin integrity, edema, etc.): Pt showed good effort, ultimately needed constant assist with mobility    Exercises General Exercises - Lower Extremity Ankle Circles/Pumps: AROM, 15 reps Quad Sets: Strengthening, 10 reps Short Arc Quad: AROM, 10 reps Heel Slides: AAROM, 5 reps (lightly resisted leg ext) Hip ABduction/ADduction: AROM, 10 reps   Assessment/Plan    PT Assessment Patient needs continued PT services  PT Problem List Decreased strength;Decreased range of motion;Decreased activity tolerance;Decreased balance;Decreased mobility;Decreased cognition;Decreased knowledge of use of DME;Decreased safety awareness;Decreased knowledge of precautions;Pain       PT  Treatment Interventions DME instruction;Gait training;Stair training;Functional mobility training;Therapeutic activities;Balance training;Therapeutic exercise;Patient/family education    PT Goals (Current goals can be found in the Care Plan section)  Acute Rehab PT Goals Patient Stated Goal: Get better PT Goal Formulation: With patient Time For Goal Achievement: 12/18/22 Potential to Achieve Goals: Fair    Frequency BID     Co-evaluation               AM-PAC PT "6 Clicks" Mobility  Outcome Measure Help needed turning from your back to your side while in a flat bed without using bedrails?: A Lot Help needed moving from lying on your back to sitting on the side of a flat bed without using bedrails?: A Lot Help needed moving to and from a bed to a chair (including a wheelchair)?: A Lot Help needed standing up from a chair using your arms (e.g., wheelchair or bedside chair)?: A Lot Help needed to walk in hospital room?: Total Help needed climbing 3-5 steps with a railing? : Total 6 Click Score: 10    End of Session Equipment Utilized During Treatment: Gait belt  Activity Tolerance: Patient tolerated treatment well Patient left: in chair;with call bell/phone within reach;with family/visitor present Nurse Communication: Mobility status PT Visit Diagnosis: Muscle weakness (generalized) (M62.81);Difficulty in walking, not elsewhere classified (R26.2);Pain Pain - Right/Left: Left Pain - part of body: Hip    Time: 0630-1601 PT Time Calculation (min) (ACUTE ONLY): 32 min   Charges:   PT Evaluation $PT Eval Low Complexity: 1 Low PT Treatments $Therapeutic Exercise: 8-22 mins $Therapeutic Activity: 8-22 mins        Kreg Shropshire, DPT 12/05/2022, 9:36 AM

## 2022-12-06 DIAGNOSIS — Z8781 Personal history of (healed) traumatic fracture: Secondary | ICD-10-CM | POA: Diagnosis not present

## 2022-12-06 LAB — BASIC METABOLIC PANEL
Anion gap: 6 (ref 5–15)
BUN: 12 mg/dL (ref 8–23)
CO2: 20 mmol/L — ABNORMAL LOW (ref 22–32)
Calcium: 7.8 mg/dL — ABNORMAL LOW (ref 8.9–10.3)
Chloride: 104 mmol/L (ref 98–111)
Creatinine, Ser: 0.72 mg/dL (ref 0.44–1.00)
GFR, Estimated: >60 mL/min (ref >60)
Glucose, Bld: 116 mg/dL — ABNORMAL HIGH (ref 70–99)
Potassium: 3.5 mmol/L (ref 3.5–5.1)
Sodium: 130 mmol/L — ABNORMAL LOW (ref 135–145)

## 2022-12-06 LAB — CBC
HCT: 25.5 % — ABNORMAL LOW (ref 36.0–46.0)
Hemoglobin: 8.3 g/dL — ABNORMAL LOW (ref 12.0–15.0)
MCH: 27.9 pg (ref 26.0–34.0)
MCHC: 32.5 g/dL (ref 30.0–36.0)
MCV: 85.9 fL (ref 80.0–100.0)
Platelets: 201 10*3/uL (ref 150–400)
RBC: 2.97 MIL/uL — ABNORMAL LOW (ref 3.87–5.11)
RDW: 16.9 % — ABNORMAL HIGH (ref 11.5–15.5)
WBC: 8.7 10*3/uL (ref 4.0–10.5)
nRBC: 0 % (ref 0.0–0.2)

## 2022-12-06 MED ORDER — ASPIRIN 81 MG PO CHEW: 81.0000 mg | CHEWABLE_TABLET | Freq: Every day | ORAL | 0 refills | Status: AC

## 2022-12-06 MED ORDER — HYDROCODONE-ACETAMINOPHEN 5-325 MG PO TABS: 1.0000 | ORAL_TABLET | Freq: Four times a day (QID) | ORAL | 0 refills | Status: DC

## 2022-12-06 MED ORDER — POLYETHYLENE GLYCOL 3350 17 G PO PACK: 17.0000 g | PACK | Freq: Every day | ORAL | 0 refills | Status: DC

## 2022-12-06 NOTE — Progress Notes (Signed)
Physical Therapy Treatment Patient Details Name: Michele [name] MRN: [id number] DOB: November 07, 1923 Today's Date: 12/06/2022   History of Present Illness Pt is a 89 y.o. female with medical history significant of right parietal occipital infarct, anxiety, PAF of AC due to chronic anemia, memory deficit, depression, hypertension, large hiatal hernia, and diverticulosis who presents to ED s/p slip and fall on wet surface while walking with walker.  Found to have L hip fx and now s/p IM nailing 12/04/22.    PT Comments    Pt was pleasant and motivated to participate during the session and put forth good effort throughout. Pt was very limited by pain with any movement of her LLE, however, and required significant +2 physical assistance with bed mobility tasks and transfers. Once in standing pt was able to do some minimal ambulation at the EOB and to the chair with slow, effortful, antalgic steps with mostly shuffling her feet but would occasionally clear the floor.  Pt will benefit from PT services in a SNF setting upon discharge to safely address deficits listed in patient problem list for decreased caregiver assistance and eventual return to PLOF.      Recommendations for follow up therapy are one component of a multi-disciplinary discharge planning process, led by the attending physician.  Recommendations may be updated based on patient status, additional functional criteria and insurance authorization.  Follow Up Recommendations  Skilled nursing-short term rehab (<3 hours/day) Can patient physically be transported by private vehicle: No   Assistance Recommended at Discharge Frequent or constant Supervision/Assistance  Patient can return home with the following A lot of help with bathing/dressing/bathroom;Two people to help with walking and/or transfers;Assistance with cooking/housework;Assist for transportation;Help with stairs or ramp for entrance   Equipment Recommendations  None recommended by PT     Recommendations for Other Services       Precautions / Restrictions Precautions Precautions: Fall Restrictions Weight Bearing Restrictions: Yes LLE Weight Bearing: Weight bearing as tolerated     Mobility  Bed Mobility Overal bed mobility: Needs Assistance Bed Mobility: Supine to Sit     Supine to sit: Max assist, +2 for physical assistance     General bed mobility comments: Pt with minimal participation with bed mobility tasks, very anxious with LLE movement    Transfers Overall transfer level: Needs assistance Equipment used: Rolling walker (2 wheels) Transfers: Sit to/from Stand Sit to Stand: +2 physical assistance, Mod assist           General transfer comment: Mod verbal cues for sequencing for hand placement and increased trunk flexion    Ambulation/Gait Ambulation/Gait assistance: Mod assist Gait Distance (Feet): 5 Feet Assistive device: Rolling walker (2 wheels) Gait Pattern/deviations: Step-to pattern, Trunk flexed, Decreased step length - right, Decreased stance time - left, Antalgic Gait velocity: decreased     General Gait Details: Pt was able to amb near the EOB and then from bed to chair with mod A for stability and to guide the RW   Stairs             Wheelchair Mobility    Modified Rankin (Stroke Patients Only)       Balance Overall balance assessment: Needs assistance Sitting-balance support: Bilateral upper extremity supported Sitting balance-Leahy Scale: Fair     Standing balance support: Bilateral upper extremity supported, During functional activity, Reliant on assistive device for balance Standing balance-Leahy Scale: Poor  Cognition Arousal/Alertness: Awake/alert Behavior During Therapy: WFL for tasks assessed/performed Overall Cognitive Status: History of cognitive impairments - at baseline                                          Exercises Other  Exercises Other Exercises: Static sitting at EOB x 5 min for core therex and improved activity tolerance    General Comments        Pertinent Vitals/Pain Pain Assessment Pain Assessment: PAINAD Breathing: normal Negative Vocalization: occasional moan/groan, low speech, negative/disapproving quality Facial Expression: facial grimacing Body Language: tense, distressed pacing, fidgeting Consolability: distracted or reassured by voice/touch PAINAD Score: 5 Pain Location: Pain with any L hip movement, no pain at rest Pain Intervention(s): Repositioned, Premedicated before session, Monitored during session    Home Living                          Prior Function            PT Goals (current goals can now be found in the care plan section) Progress towards PT goals: PT to reassess next treatment    Frequency    BID      PT Plan Current plan remains appropriate    Co-evaluation              AM-PAC PT "6 Clicks" Mobility   Outcome Measure  Help needed turning from your back to your side while in a flat bed without using bedrails?: Total Help needed moving from lying on your back to sitting on the side of a flat bed without using bedrails?: Total Help needed moving to and from a bed to a chair (including a wheelchair)?: Total Help needed standing up from a chair using your arms (e.g., wheelchair or bedside chair)?: A Lot Help needed to walk in hospital room?: Total Help needed climbing 3-5 steps with a railing? : Total 6 Click Score: 7    End of Session Equipment Utilized During Treatment: Gait belt Activity Tolerance: Patient limited by pain Patient left: in chair;with call bell/phone within reach;with chair alarm set;with SCD's reapplied;with family/visitor present;with nursing/sitter in room Nurse Communication: Mobility status PT Visit Diagnosis: Muscle weakness (generalized) (M62.81);Difficulty in walking, not elsewhere classified (R26.2);Pain Pain -  Right/Left: Left Pain - part of body: Hip     Time: 0272-5366 PT Time Calculation (min) (ACUTE ONLY): 26 min  Charges:  $Gait Training: 8-22 mins $Therapeutic Activity: 8-22 mins                     D. Scott Tatisha Cerino PT, DPT 12/06/22, 12:13 PM

## 2022-12-06 NOTE — Plan of Care (Signed)

## 2022-12-06 NOTE — TOC Progression Note (Signed)
Transition of Care [facility]) - Progression Note    Patient Details  Name: Michele [name] MRN: [id number] Date of Birth: [birth year]   Transition of Care [facility]) CM/SW [place] Phone Number: 12/06/2022, 3:44 PM   [facility] Appeal Detailed Notice of Discharge letter created and saved: Yes (Completed by Bronson Ing, RNCM) Detailed Notice of Discharge Document Given to [employer]: Yes (Completed by Bronson Ing, RNCM) [employer] ROI Document Created: Yes [employer] appeal documents uploaded to [employer] stite: Yes (Upload Confirmation:[id number])    Case ID: [id number]

## 2022-12-06 NOTE — Discharge Summary (Signed)
Michele [name] JGO:[id number] DOB: September 19, 1924 DOA: 12/03/2022  PCP: Baxter Hire, MD  Admit date: 12/03/2022 Discharge date: 12/07/2022  Time spent: 35 minutes  Recommendations for Outpatient Follow-up:  Ortho f/u 2 weeks     Discharge Diagnoses:  Principal Problem:   S/p left hip fracture Active Problems:   Essential hypertension, benign   Age related osteoporosis   Chronic CHF ([place])   H/O: CVA (cerebrovascular accident)   PAF (paroxysmal atrial fibrillation) ([place])   Stage 3a chronic kidney disease ([place])   Moderate late onset Alzheimer's dementia ([place])   Fracture, hip, left, closed, initial encounter [facility])   Discharge Condition: stable  Diet recommendation: regular  Filed Weights   12/03/22 1646 12/04/22 1519  Weight: 45 kg 45 kg    History of present illness:  From admission h and p Michele [name] is a 89 y.o. female with medical history significant of  89 year old female with history of right parietal occipital infarct, anxiety, PAF of AC due to chronic anemia , Anemia , memory deficit,  depression, hypertension, large hiatal hernia, diverticulosis, who presents to ED s/p slip and fall on wet surface while walking with walker at [facility]. Patient currently states she has no pain. She notes no sob/chest pain / n/v/diarrhea, but appears to have complaint of mild dysuria.  She is not able to give full history of event. Event details taken from chart.   Hospital Course:  Presented after fall at home found to have left intertrochanteric femur fracture repaired with intramedulary nail on 1/31 with Dr. Janace Hoard. Baseline anemia, was 7.9 prior to surgery, we gave 1 unit and hgb has been stable in the 8s since surgery. Baseline hyponatremia stable. Followed by hospice as outpatient, has advanced dementia, daughter elects to take a mainly comfort based approach, treat what's reasonably treatable. We reviewed meds and given this approach and also given relative hypotension here have held her home  BP meds. Patient did have urinary retention in the ED but foley has since been discontinued and she has not retained. Has also had bowel movement after surgery and is tolerating diet. Discharged to hospice home, sounds like plan is temporary stay there while family makes further arrangements for in-home care.  Procedures: See above   Consultations: orthopedics  Discharge Exam: Vitals:   12/06/22 1539 12/06/22 2321  BP: 124/63 (!) 112/57  Pulse: 78 79  Resp: 17 18  Temp: 97.8 F (36.6 C) (!) 97.4 F (36.3 C)  SpO2: 100% 97%    General exam: mildly uncomfortable  Respiratory system: Clear to auscultation. Respiratory effort normal. Cardiovascular system: S1 & S2 heard, irreg, mod systolic murmur Gastrointestinal system: Abdomen is nondistended, soft and nontender. No organomegaly or masses felt. Normal bowel sounds heard. Central nervous system: awake and alert oriented to self Extremities: 1+ LE edema, left left lateral bandage Skin: No rashes, lesions or ulcers Psychiatry: confused  Discharge Instructions   Discharge Instructions     Diet general   Complete by: As directed    Increase activity slowly   Complete by: As directed    Increase activity slowly   Complete by: As directed       Allergies as of 12/07/2022       Reactions   Penicillins Rash   Has patient had a PCN reaction causing immediate rash, facial/tongue/throat swelling, SOB or lightheadedness with hypotension: Yes Has patient had a PCN reaction causing severe rash involving mucus membranes or skin necrosis: No Has patient had a PCN reaction  that required hospitalization: No Has patient had a PCN reaction occurring within the last 10 years: No If all of the above answers are "NO", then may proceed with Cephalosporin use.        Medication List     STOP taking these medications    metoprolol succinate 25 MG 24 hr tablet Commonly known as: TOPROL-XL   ramipril 5 MG capsule Commonly known as:  ALTACE   spironolactone 25 MG tablet Commonly known as: ALDACTONE       TAKE these medications    ALPRAZolam 0.25 MG tablet Commonly known as: XANAX Take 1 tablet (0.25 mg total) by mouth 2 (two) times daily as needed.   aspirin 81 MG chewable tablet Chew 1 tablet (81 mg total) by mouth daily. Notes to patient: Not given in hospital   busPIRone 10 MG tablet Commonly known as: BUSPAR Take 10 mg by mouth 2 (two) times daily.   diclofenac sodium 1 % Gel Commonly known as: VOLTAREN Apply 4 g topically 4 (four) times daily as needed (pain). Notes to patient: Not applied in hospital   docusate sodium 100 MG capsule Commonly known as: COLACE Take 200 mg by mouth daily as needed for mild constipation.   HYDROcodone-acetaminophen 5-325 MG tablet Commonly known as: NORCO/VICODIN Take 1 tablet by mouth 4 (four) times daily.   polyethylene glycol 17 g packet Commonly known as: MiraLax Take 17 g by mouth daily.   QUEtiapine 50 MG tablet Commonly known as: SEROQUEL Take 25 mg by mouth at bedtime.   sertraline 25 MG tablet Commonly known as: ZOLOFT Take 25 mg by mouth at bedtime.   triamcinolone cream 0.5 % Commonly known as: KENALOG Apply 1 Application topically 2 (two) times daily. Notes to patient: Not applied in hospital       Allergies  Allergen Reactions   Penicillins Rash    Has patient had a PCN reaction causing immediate rash, facial/tongue/throat swelling, SOB or lightheadedness with hypotension: Yes Has patient had a PCN reaction causing severe rash involving mucus membranes or skin necrosis: No Has patient had a PCN reaction that required hospitalization: No Has patient had a PCN reaction occurring within the last 10 years: No If all of the above answers are "NO", then may proceed with Cephalosporin use.    Follow-up Information     Carlynn Spry, PA-C. Go to.   Specialty: Orthopedic Surgery Why: 12/20/22 at 11:00 am Contact information: [place] [address] [phone]                  The results of significant diagnostics from this hospitalization (including imaging, microbiology, ancillary and laboratory) are listed below for reference.    Significant Diagnostic Studies: DG HIP UNILAT WITH PELVIS 2-3 VIEWS LEFT  Result Date: 12/04/2022 CLINICAL DATA:  Status post ORIF of the left hip. EXAM: DG HIP (WITH OR WITHOUT PELVIS) 2-3V LEFT COMPARISON:  12/03/2022 FINDINGS: Six images obtained via portable C-arm radiography in the operating room were submitted. Images demonstrate open reduction and internal fixation of the comminuted intertrochanteric fracture of the left femur. Placement of IM nail and hip screw noted. IMPRESSION: Status post ORIF of comminuted intertrochanteric fracture of the proximal left femur. Electronically Signed   By: Kerby Moors M.D.   On: 12/04/2022 18:50   DG C-Arm 1-60 Min-No Report  Result Date: 12/04/2022 Fluoroscopy was utilized by the requesting physician.  No radiographic interpretation.   CT HEAD WO CONTRAST (5MM)  Result Date:  12/03/2022 CLINICAL DATA:  Fall EXAM: CT HEAD WITHOUT CONTRAST TECHNIQUE: Contiguous axial images were obtained from the base of the skull through the vertex without intravenous contrast. RADIATION DOSE REDUCTION: This exam was performed according to the departmental dose-optimization program which includes automated exposure control, adjustment of the mA and/or kV according to patient size and/or use of iterative reconstruction technique. COMPARISON:  06/29/2022 FINDINGS: Brain: No evidence of acute infarction, hemorrhage, mass, mass effect, or midline shift. No hydrocephalus or extra-axial fluid collection. Redemonstrated remote infarcts in right parieto-occipital region and left greater than right cerebellar hemisphere, with interval development of additional chronic appearing infarcts in left occipital lobe (series 4, image 53) and bilateral  more superior cerebellar hemispheres (series 3, image 8 and series 4, image 50-53). Periventricular white matter changes, likely the sequela of chronic small vessel ischemic disease. Vascular: No hyperdense vessel. Atherosclerotic calcifications in the intracranial carotid and vertebral arteries. Skull: Negative for fracture or focal lesion. Sinuses/Orbits: Mucosal thickening in the ethmoid air cells. No acute finding in the orbits. Status post left lens replacement. Other: The mastoid air cells are well aerated. IMPRESSION: 1. No acute intracranial process. 2. Since 06/29/2022, interval development of chronic appearing infarcts in the left occipital lobe and bilateral superior cerebellar hemispheres. Electronically Signed   By: Merilyn Baba M.D.   On: 12/03/2022 18:13   DG Chest 1 View  Result Date: 12/03/2022 CLINICAL DATA:  Fall today.  Left hip fracture. EXAM: CHEST  1 VIEW COMPARISON:  [place] 02/14/2022 and 07/08/2018. CT chest 11/08/2017 and abdomen 06/29/2022. FINDINGS: 1704 hours. Patient is rotated to the left. There are low lung volumes. Stable cardiomegaly, aortic atherosclerosis and a small hiatal hernia. Volume loss at the left lung base and associated atelectasis are similar to recent CT. There are nonacute appearing left rib fractures. Multiple thoracolumbar compression deformities are grossly unchanged. IMPRESSION: No evidence of acute chest injury. Chronic left basilar atelectasis as seen on recent CT. Electronically Signed   By: Richardean Sale M.D.   On: 12/03/2022 17:35   DG Hip Unilat W or Wo Pelvis 2-3 Views Left  Result Date: 12/03/2022 CLINICAL DATA:  Fall today when ambulating with walker at nursing facility. EXAM: DG HIP (WITH OR WITHOUT PELVIS) 2-3V LEFT COMPARISON:  None Available. FINDINGS: AP pelvis with AP and frog-leg lateral views of the left hip. The bones are diffusely demineralized. There is a comminuted and displaced intertrochanteric left femur fracture. No evidence of  dislocation or acute pelvic fracture. Previous right femoral neck pinning noted. There are degenerative changes within the visualized lower lumbar spine. IMPRESSION: Comminuted and displaced intertrochanteric left femur fracture. Electronically Signed   By: Richardean Sale M.D.   On: 12/03/2022 17:32    Microbiology: Recent Results (from the past 240 hour(s))  Resp panel by RT-PCR (RSV, Flu A&B, Covid) Anterior Nasal Swab     Status: None   Collection Time: 12/03/22  6:47 PM   Specimen: Anterior Nasal Swab  Result Value Ref Range Status   SARS Coronavirus 2 by RT PCR NEGATIVE NEGATIVE Final    Comment: (NOTE) SARS-CoV-2 target nucleic acids are NOT DETECTED.  The SARS-CoV-2 RNA is generally detectable in upper respiratory specimens during the acute phase of infection. The lowest concentration of SARS-CoV-2 viral copies this assay can detect is 138 copies/mL. A negative result does not preclude SARS-Cov-2 infection and should not be used as the sole basis for treatment or other patient management decisions. A negative result may occur with  improper specimen  collection/handling, submission of specimen other than nasopharyngeal swab, presence of viral mutation(s) within the areas targeted by this assay, and inadequate number of viral copies(<138 copies/mL). A negative result must be combined with clinical observations, patient history, and epidemiological information. The expected result is Negative.  Fact Sheet for Patients:  EntrepreneurPulse.com.au  Fact Sheet for Healthcare Providers:  IncredibleEmployment.be  This test is no t yet approved or cleared by the [place] FDA and  has been authorized for detection and/or diagnosis of SARS-CoV-2 by FDA under an Emergency Use Authorization (EUA). This EUA will remain  in effect (meaning this test can be used) for the duration of the COVID-19 declaration under Section 564(b)(1) of the Act,  21 U.S.C.section 360bbb-3(b)(1), unless the authorization is terminated  or revoked sooner.       Influenza A by PCR NEGATIVE NEGATIVE Final   Influenza B by PCR NEGATIVE NEGATIVE Final    Comment: (NOTE) The Xpert Xpress SARS-CoV-2/FLU/RSV plus assay is intended as an aid in the diagnosis of influenza from Nasopharyngeal swab specimens and should not be used as a sole basis for treatment. Nasal washings and aspirates are unacceptable for Xpert Xpress SARS-CoV-2/FLU/RSV testing.  Fact Sheet for Patients: EntrepreneurPulse.com.au  Fact Sheet for Healthcare Providers: IncredibleEmployment.be  This test is not yet approved or cleared by the [place] FDA and has been authorized for detection and/or diagnosis of SARS-CoV-2 by FDA under an Emergency Use Authorization (EUA). This EUA will remain in effect (meaning this test can be used) for the duration of the COVID-19 declaration under Section 564(b)(1) of the Act, 21 U.S.C. section 360bbb-3(b)(1), unless the authorization is terminated or revoked.     Resp Syncytial Virus by PCR NEGATIVE NEGATIVE Final    Comment: (NOTE) Fact Sheet for Patients: EntrepreneurPulse.com.au  Fact Sheet for Healthcare Providers: IncredibleEmployment.be  This test is not yet approved or cleared by the [place] FDA and has been authorized for detection and/or diagnosis of SARS-CoV-2 by FDA under an Emergency Use Authorization (EUA). This EUA will remain in effect (meaning this test can be used) for the duration of the COVID-19 declaration under Section 564(b)(1) of the Act, 21 U.S.C. section 360bbb-3(b)(1), unless the authorization is terminated or revoked.  Performed at [facility], [address] [place], [place] [address]      Labs: Basic Metabolic Panel: Recent Labs  Lab 12/03/22 1818 12/03/22 2144 12/04/22 0539 12/05/22 0648 12/06/22 0417   NA 127* 128* 129* 129* 130*  K 4.4 4.3 4.5 4.1 3.5  CL 96* 98 101 103 104  CO2 22 21* 23 21* 20*  GLUCOSE 117* 110* 109* 111* 116*  BUN '18 16 15 13 12  '$ CREATININE 0.93 0.91 0.73 0.70 0.72  CALCIUM 8.7* 8.4* 8.1* 7.9* 7.8*   Liver Function Tests: Recent Labs  Lab 12/03/22 1818  AST 19  ALT 12  ALKPHOS 72  BILITOT 0.5  PROT 6.0*  ALBUMIN 3.3*   No results for input(s): "LIPASE", "AMYLASE" in the last 168 hours. No results for input(s): "AMMONIA" in the last 168 hours. CBC: Recent Labs  Lab 12/03/22 1818 12/04/22 0539 12/05/22 0055 12/05/22 0648 12/06/22 0417  WBC 18.2* 9.2  9.2  --  8.7 8.7  NEUTROABS 15.8* 7.3  --   --   --   HGB 10.0* 8.0*  7.9* 9.7* 9.5* 8.3*  HCT 31.4* 25.5*  25.4* 29.4* 28.7* 25.5*  MCV 85.1 86.4  86.4  --  86.4 85.9  PLT 322 264  241  --  210 201   Cardiac Enzymes: No results for input(s): "CKTOTAL", "CKMB", "CKMBINDEX", "TROPONINI" in the last 168 hours. BNP: BNP (last 3 results) No results for input(s): "BNP" in the last 8760 hours.  ProBNP (last 3 results) No results for input(s): "PROBNP" in the last 8760 hours.  CBG: Recent Labs  Lab 12/05/22 0151  GLUCAP 101*       Signed:  Desma Maxim MD.  [facility] 12/07/2022, 9:51 AM

## 2022-12-06 NOTE — TOC Progression Note (Signed)
Transition of Care [facility]) - Progression Note    Patient Details  Name: Michele [name] MRN: [id number] Date of Birth: 07-26-1924  Transition of Care [facility]) CM/SW Meyer, RN Phone Number: 12/06/2022, 2:36 PM  Clinical Narrative:     Called the patient's daughter Edd Fabian And reviewed the Detailed Notice of Discharge and the HIN 12, She stated understanding, I offered to mail these forms and she declined.  She stated that she contacted Hospice and they will get things set up, She asked if she changed her mind and decided to go to [facility] would we have other bed offers, I explained to her that the ones that were reviewed earlier will be the only option in this area as the other have declined, she stated understanding  Expected Discharge Plan: Skilled Nursing Facility Barriers to Discharge: Insurance Authorization, SNF Pending bed offer  Expected Discharge Plan and Services   Discharge Planning Services: CM Consult   Living arrangements for the past 2 months: [place] Expected Discharge Date: 12/06/22                                     Social Determinants of Health (SDOH) Interventions SDOH Screenings   Food Insecurity: No Food Insecurity (12/04/2022)  Housing: Low Risk  (12/04/2022)  Transportation Needs: No Transportation Needs (12/04/2022)  Utilities: Not At Risk (12/04/2022)  Tobacco Use: Low Risk  (12/05/2022)    Readmission Risk Interventions     No data to display

## 2022-12-06 NOTE — Progress Notes (Signed)
PROGRESS NOTE    Michele [name]  HYI:[id number] DOB: 1924-03-06 DOA: 12/03/2022 PCP: Baxter Hire, MD  Outpatient Specialists: cardiology    Brief Narrative:   From admission h and p  Michele [name] is a 89 y.o. female with medical history significant of  89 year old female with history of right parietal occipital infarct, anxiety, PAF of AC due to chronic anemia , Anemia , memory deficit,  depression, hypertension, large hiatal hernia, diverticulosis, who presents to ED s/p slip and fall on wet surface while walking with walker at [facility]. Patient currently states she has no pain. She notes no sob/chest pain / n/v/diarrhea, but appears to have complaint of mild dysuria.  She is not able to give full history of event. Event details taken from chart.   Assessment & Plan:   Principal Problem:   S/p left hip fracture Active Problems:   Essential hypertension, benign   Age related osteoporosis   Chronic CHF ([place])   H/O: CVA (cerebrovascular accident)   PAF (paroxysmal atrial fibrillation) ([place])   Stage 3a chronic kidney disease ([place])   Moderate late onset Alzheimer's dementia ([place])   Fracture, hip, left, closed, initial encounter [facility])  # Disposition Family declines SNF, has home health aides and wants to return to [facility] with home hospice. Patient is stable for discharge - pain is controlled, tolerating diet, no bowel/bladder dysfunction, no further intervention planned by orthopedics. I shared this with daughter today and she assented, but a short while later I learned family has appealed discharge.  # Left intertrochanteric femur fracture After mechanical fall at home. CT head nothing acute. S/p intramedullary nail operative repair on 1/31 w/ Dr. Harlow Mares - pain is controlled, BM today, hgb stable - lovenox dvt ppx while inpatient, plan for aspirin at discharge - ortho f/u 2 wks  # Anemia Chronic normocytic hgb 10 on arrival but baseline close to 8, was 7.9 yesterday. No report  of melena or other bleeding. Transfused one unit 1/31, today post-op hgb is 9.5. Iron studies suggest baseline anemia of chronic disease. Hgb stable today in 8s - monitor  # HTN currently bp low normal, received fluids overnight for post-op hypotension - holding home meds (ramipril, spironolactone, metoprolol). Given daughter's wishes for mainly comfort-based approach, will plan on holding at discharge  # Hyponatremia Chronic, baseline appears to be low 130s, here 130 today. Tsh wnl. Not volume overloaded. Labs suggest siadh. Am cortisol appropriate - holding home spiro as above. Sertraline may also contribute  # Urinary retention AM 1/31 unable to void and bladder scan showed 400 ml. Foley placed, I/o cath doesn't appear was tried. Foley now discontinued and patient able to urinate - monitor  # Hx CVA Hospitalized earlier this year, CT head shows more recent infarcts as well, nothing acute - not on secondary prevention meds given comfort-based approach  # Dementia # Hospice # [facility], has home health aides 24/7, lives in assisted living, also followed by hospice. Daughter says approach is primarily comfort, treating what's easily treatable, not extending life - cont home seroquel qhs, xanax prn, sertraline  # A-fib Rate controlled. At last cardiology visit was on apixaban but discontinued given comfort based approach - resume metop when bp improves  # Aortic stenosis Moderate. Appears compensated - monitor   DVT prophylaxis: lovenox Code Status: dnr Family Communication: daughter Edd Fabian updated telephonically 2/2  Level of care: Med-Surg Status is: Inpatient Remains inpatient appropriate because: severity of illness    Consultants:  ortho  Procedures: Left femur IM nail 1/31  Antimicrobials:  Peri-operative    Subjective: No distress, eating breakfast  Objective: Vitals:   12/05/22 1709 12/05/22 1716 12/06/22 0005 12/06/22 0743  BP: (!) 86/61  91/78 95/64 (!) 103/56  Pulse: 73  80 79  Resp: '20  18 18  '$ Temp: 98.7 F (37.1 C)  98.3 F (36.8 C) 97.7 F (36.5 C)  TempSrc:      SpO2: 95%  96% 97%  Weight:      Height:        Intake/Output Summary (Last 24 hours) at 12/06/2022 1119 Last data filed at 12/05/2022 1643 Gross per 24 hour  Intake 300 ml  Output 100 ml  Net 200 ml   Filed Weights   12/03/22 1646 12/04/22 1519  Weight: 45 kg 45 kg    Examination:  General exam: NAD Respiratory system: Clear to auscultation. Respiratory effort normal. Cardiovascular system: S1 & S2 heard, irreg, mod systolic murmur Gastrointestinal system: Abdomen is nondistended, soft and nontender. No organomegaly or masses felt. Normal bowel sounds heard. Central nervous system: awake and alert oriented to self Extremities: 1+ LE edema, left left lateral bandage Skin: No rashes, lesions or ulcers Psychiatry: confused    Data Reviewed: I have personally reviewed following labs and imaging studies  CBC: Recent Labs  Lab 12/03/22 1818 12/04/22 0539 12/05/22 0055 12/05/22 0648 12/06/22 0417  WBC 18.2* 9.2  9.2  --  8.7 8.7  NEUTROABS 15.8* 7.3  --   --   --   HGB 10.0* 8.0*  7.9* 9.7* 9.5* 8.3*  HCT 31.4* 25.5*  25.4* 29.4* 28.7* 25.5*  MCV 85.1 86.4  86.4  --  86.4 85.9  PLT 322 264  241  --  210 157   Basic Metabolic Panel: Recent Labs  Lab 12/03/22 1818 12/03/22 2144 12/04/22 0539 12/05/22 0648 12/06/22 0417  NA 127* 128* 129* 129* 130*  K 4.4 4.3 4.5 4.1 3.5  CL 96* 98 101 103 104  CO2 22 21* 23 21* 20*  GLUCOSE 117* 110* 109* 111* 116*  BUN '18 16 15 13 12  '$ CREATININE 0.93 0.91 0.73 0.70 0.72  CALCIUM 8.7* 8.4* 8.1* 7.9* 7.8*   GFR: Estimated Creatinine Clearance: 29.2 mL/min (by C-G formula based on SCr of 0.72 mg/dL). Liver Function Tests: Recent Labs  Lab 12/03/22 1818  AST 19  ALT 12  ALKPHOS 72  BILITOT 0.5  PROT 6.0*  ALBUMIN 3.3*   No results for input(s): "LIPASE", "AMYLASE" in the last 168  hours. No results for input(s): "AMMONIA" in the last 168 hours. Coagulation Profile: Recent Labs  Lab 12/03/22 1818  INR 1.1   Cardiac Enzymes: No results for input(s): "CKTOTAL", "CKMB", "CKMBINDEX", "TROPONINI" in the last 168 hours. BNP (last 3 results) No results for input(s): "PROBNP" in the last 8760 hours. HbA1C: No results for input(s): "HGBA1C" in the last 72 hours. CBG: Recent Labs  Lab 12/05/22 0151  GLUCAP 101*   Lipid Profile: No results for input(s): "CHOL", "HDL", "LDLCALC", "TRIG", "CHOLHDL", "LDLDIRECT" in the last 72 hours. Thyroid Function Tests: Recent Labs    12/04/22 0539  TSH 1.613   Anemia Panel: Recent Labs    12/04/22 0539  FERRITIN 17  TIBC 255  IRON 93   Urine analysis:    Component Value Date/Time   COLORURINE STRAW (A) 12/03/2022 2050   APPEARANCEUR CLEAR (A) 12/03/2022 2050   LABSPEC 1.010 12/03/2022 2050   PHURINE 7.0 12/03/2022 2050  GLUCOSEU NEGATIVE 12/03/2022 2050   [place] NEGATIVE 12/03/2022 2050   [place] NEGATIVE 12/03/2022 2050   [place] 12/03/2022 2050   PROTEINUR NEGATIVE 12/03/2022 2050   NITRITE NEGATIVE 12/03/2022 2050   LEUKOCYTESUR SMALL (A) 12/03/2022 2050   Sepsis Labs: '@LABRCNTIP'$ (procalcitonin:4,lacticidven:4)  ) Recent Results (from the past 240 hour(s))  Resp panel by RT-PCR (RSV, Flu A&B, Covid) Anterior Nasal Swab     Status: None   Collection Time: 12/03/22  6:47 PM   Specimen: Anterior Nasal Swab  Result Value Ref Range Status   SARS Coronavirus 2 by RT PCR NEGATIVE NEGATIVE Final    Comment: (NOTE) SARS-CoV-2 target nucleic acids are NOT DETECTED.  The SARS-CoV-2 RNA is generally detectable in upper respiratory specimens during the acute phase of infection. The lowest concentration of SARS-CoV-2 viral copies this assay can detect is 138 copies/mL. A negative result does not preclude SARS-Cov-2 infection and should not be used as the sole basis for treatment or other patient  management decisions. A negative result may occur with  improper specimen collection/handling, submission of specimen other than nasopharyngeal swab, presence of viral mutation(s) within the areas targeted by this assay, and inadequate number of viral copies(<138 copies/mL). A negative result must be combined with clinical observations, patient history, and epidemiological information. The expected result is Negative.  Fact Sheet for Patients:  EntrepreneurPulse.com.au  Fact Sheet for Healthcare Providers:  IncredibleEmployment.be  This test is no t yet approved or cleared by the [place] FDA and  has been authorized for detection and/or diagnosis of SARS-CoV-2 by FDA under an Emergency Use Authorization (EUA). This EUA will remain  in effect (meaning this test can be used) for the duration of the COVID-19 declaration under Section 564(b)(1) of the Act, 21 U.S.C.section 360bbb-3(b)(1), unless the authorization is terminated  or revoked sooner.       Influenza A by PCR NEGATIVE NEGATIVE Final   Influenza B by PCR NEGATIVE NEGATIVE Final    Comment: (NOTE) The Xpert Xpress SARS-CoV-2/FLU/RSV plus assay is intended as an aid in the diagnosis of influenza from Nasopharyngeal swab specimens and should not be used as a sole basis for treatment. Nasal washings and aspirates are unacceptable for Xpert Xpress SARS-CoV-2/FLU/RSV testing.  Fact Sheet for Patients: EntrepreneurPulse.com.au  Fact Sheet for Healthcare Providers: IncredibleEmployment.be  This test is not yet approved or cleared by the [place] FDA and has been authorized for detection and/or diagnosis of SARS-CoV-2 by FDA under an Emergency Use Authorization (EUA). This EUA will remain in effect (meaning this test can be used) for the duration of the COVID-19 declaration under Section 564(b)(1) of the Act, 21 U.S.C. section 360bbb-3(b)(1),  unless the authorization is terminated or revoked.     Resp Syncytial Virus by PCR NEGATIVE NEGATIVE Final    Comment: (NOTE) Fact Sheet for Patients: EntrepreneurPulse.com.au  Fact Sheet for Healthcare Providers: IncredibleEmployment.be  This test is not yet approved or cleared by the [place] FDA and has been authorized for detection and/or diagnosis of SARS-CoV-2 by FDA under an Emergency Use Authorization (EUA). This EUA will remain in effect (meaning this test can be used) for the duration of the COVID-19 declaration under Section 564(b)(1) of the Act, 21 U.S.C. section 360bbb-3(b)(1), unless the authorization is terminated or revoked.  Performed at [facility], [address] [place], [place] [address]          Radiology Studies: DG HIP UNILAT WITH PELVIS 2-3 VIEWS LEFT  Result Date: 12/04/2022 CLINICAL DATA:  Status post ORIF of the left hip. EXAM: DG HIP (WITH OR WITHOUT PELVIS) 2-3V LEFT COMPARISON:  12/03/2022 FINDINGS: Six images obtained via portable C-arm radiography in the operating room were submitted. Images demonstrate open reduction and internal fixation of the comminuted intertrochanteric fracture of the left femur. Placement of IM nail and hip screw noted. IMPRESSION: Status post ORIF of comminuted intertrochanteric fracture of the proximal left femur. Electronically Signed   By: Kerby Moors M.D.   On: 12/04/2022 18:50   DG C-Arm 1-60 Min-No Report  Result Date: 12/04/2022 Fluoroscopy was utilized by the requesting physician.  No radiographic interpretation.        Scheduled Meds:  sodium chloride   Intravenous Once   busPIRone  10 mg Oral BID   docusate sodium  100 mg Oral BID   enoxaparin (LOVENOX) injection  30 mg Subcutaneous Q24H   feeding supplement  237 mL Oral BID BM   multivitamin with minerals  1 tablet Oral Daily   QUEtiapine  25 mg Oral QHS   sertraline  25 mg Oral QHS    Continuous Infusions:  methocarbamol (ROBAXIN) IV Stopped (12/04/22 2124)     LOS: 3 days     Desma Maxim, MD [facility]   If 7PM-7AM, please contact night-coverage [email or web address] Password [facility] 12/06/2022, 11:19 AM

## 2022-12-06 NOTE — Care Management Important Message (Signed)
Important Message  Patient Details  Name: Michele [name] MRN: [id number] Date of Birth: 03-02-1924   Medicare Important Message Given:  Yes (Verbally reviewed with the patient's daughter Michele [name])     Conception Oms, RN 12/06/2022, 10:54 AM

## 2022-12-06 NOTE — TOC Progression Note (Addendum)
Transition of Care [facility]) - Progression Note    Patient Details  Name: Michele [name] MRN: [id number] Date of Birth: 12/29/1923  Transition of Care [facility]) CM/SW [place], RN Phone Number: 12/06/2022, 10:58 AM  Clinical Narrative:   Spoke with the patient's daughter Michele [name], she stated that she does not feel that the patient is medically ready to DC back to [facility], ALF, even tho she has a 24/7 paid caregiver, We reviewed the bed offers for STR and she stated that none of those would be acceptable, She would prefer here to go back to [facility], , she is going to appeal the Discharge, I provided her with the phone number to [facility] [phone]    Expected Discharge Plan: Skilled Nursing Facility Barriers to Discharge: Insurance Authorization, SNF Pending bed offer  Expected Discharge Plan and Services   Discharge Planning Services: CM Consult   Living arrangements for the past 2 months: [place] Expected Discharge Date: 12/06/22                                     Social Determinants of Health (SDOH) Interventions SDOH Screenings   Food Insecurity: No Food Insecurity (12/04/2022)  Housing: Low Risk  (12/04/2022)  Transportation Needs: No Transportation Needs (12/04/2022)  Utilities: Not At Risk (12/04/2022)  Tobacco Use: Low Risk  (12/05/2022)    Readmission Risk Interventions     No data to display

## 2022-12-06 NOTE — Progress Notes (Signed)
Physical Therapy Treatment Patient Details Name: Michele [name] MRN: [id number] DOB: 22-Nov-1923 Today's Date: 12/06/2022   History of Present Illness Pt is a 89 y.o. female with medical history significant of right parietal occipital infarct, anxiety, PAF of AC due to chronic anemia, memory deficit, depression, hypertension, large hiatal hernia, and diverticulosis who presents to ED s/p slip and fall on wet surface while walking with walker.  Found to have L hip fx and now s/p IM nailing 12/04/22.    PT Comments    Pt was pleasant and motivated to participate during the session and put forth good effort throughout.  Pt continued to require extensive, mostly +2 assist with functional tasks along with cuing for general sequencing.  Pt was able to stand 2 x 2 min with occasional assist for stability but required +2 assist for stability during gait as well as to assist with guiding her RW.  Pt reported no adverse symptoms during the session other than L hip pain with SpO2 and HR WNL on room air.  Pt will benefit from PT services in a SNF setting upon discharge to safely address deficits listed in patient problem list for decreased caregiver assistance and eventual return to PLOF.     Recommendations for follow up therapy are one component of a multi-disciplinary discharge planning process, led by the attending physician.  Recommendations may be updated based on patient status, additional functional criteria and insurance authorization.  Follow Up Recommendations  Skilled nursing-short term rehab (<3 hours/day) Can patient physically be transported by private vehicle: No   Assistance Recommended at Discharge Frequent or constant Supervision/Assistance  Patient can return home with the following A lot of help with bathing/dressing/bathroom;Two people to help with walking and/or transfers;Assistance with cooking/housework;Assist for transportation;Help with stairs or ramp for entrance   Equipment  Recommendations  None recommended by PT    Recommendations for Other Services       Precautions / Restrictions Precautions Precautions: Fall Restrictions Weight Bearing Restrictions: Yes LLE Weight Bearing: Weight bearing as tolerated     Mobility  Bed Mobility Overal bed mobility: Needs Assistance Bed Mobility: Sit to Supine     Supine to sit: Max assist, +2 for physical assistance Sit to supine: Max assist, +2 for physical assistance   General bed mobility comments: Pt with minimal participation with bed mobility tasks, very anxious with LLE movement    Transfers Overall transfer level: Needs assistance Equipment used: Rolling walker (2 wheels) Transfers: Sit to/from Stand Sit to Stand: +2 physical assistance, Mod assist           General transfer comment: Mod verbal and tactile cues for sequencing for hand placement and increased trunk flexion    Ambulation/Gait Ambulation/Gait assistance: Mod assist, +2 physical assistance Gait Distance (Feet): 5 Feet Assistive device: Rolling walker (2 wheels) Gait Pattern/deviations: Step-to pattern, Trunk flexed, Decreased step length - right, Decreased stance time - left, Antalgic Gait velocity: decreased     General Gait Details: Pt was able to amb from bed to chair and then side step at the EOB with +2 mod A for stability and to guide the RW   Stairs             Wheelchair Mobility    Modified Rankin (Stroke Patients Only)       Balance Overall balance assessment: Needs assistance Sitting-balance support: Bilateral upper extremity supported Sitting balance-Leahy Scale: Fair     Standing balance support: Bilateral upper extremity supported, During functional activity, Reliant  on assistive device for balance Standing balance-Leahy Scale: Poor                              Cognition Arousal/Alertness: Awake/alert Behavior During Therapy: WFL for tasks assessed/performed Overall Cognitive  Status: History of cognitive impairments - at baseline                                          Exercises Total Joint Exercises Ankle Circles/Pumps: AROM, Strengthening, Both, 10 reps Quad Sets: Strengthening, Both, 10 reps Gluteal Sets: Strengthening, Both, 10 reps Long Arc Quad: Strengthening, Both, 10 reps Knee Flexion: Strengthening, Both, 10 reps Other Exercises Other Exercises: Static standing at EOB/chair 2 min x 2 for improved activity tolerance    General Comments        Pertinent Vitals/Pain Pain Assessment Pain Assessment: PAINAD Breathing: normal Negative Vocalization: occasional moan/groan, low speech, negative/disapproving quality Facial Expression: facial grimacing Body Language: tense, distressed pacing, fidgeting Consolability: distracted or reassured by voice/touch PAINAD Score: 5 Pain Location: Pain with any L hip movement, no pain at rest Pain Intervention(s): Repositioned, Premedicated before session, Monitored during session    Home Living                          Prior Function            PT Goals (current goals can now be found in the care plan section) Progress towards PT goals: Progressing toward goals    Frequency    BID      PT Plan Current plan remains appropriate    Co-evaluation              AM-PAC PT "6 Clicks" Mobility   Outcome Measure  Help needed turning from your back to your side while in a flat bed without using bedrails?: Total Help needed moving from lying on your back to sitting on the side of a flat bed without using bedrails?: Total Help needed moving to and from a bed to a chair (including a wheelchair)?: Total Help needed standing up from a chair using your arms (e.g., wheelchair or bedside chair)?: A Lot Help needed to walk in hospital room?: Total Help needed climbing 3-5 steps with a railing? : Total 6 Click Score: 7    End of Session Equipment Utilized During Treatment:  Gait belt Activity Tolerance: Patient limited by pain Patient left: in bed;with call bell/phone within reach;with bed alarm set;with family/visitor present Nurse Communication: Mobility status PT Visit Diagnosis: Muscle weakness (generalized) (M62.81);Difficulty in walking, not elsewhere classified (R26.2);Pain Pain - Right/Left: Left Pain - part of body: Hip     Time: 1410-1436 PT Time Calculation (min) (ACUTE ONLY): 26 min  Charges:  $Gait Training: 8-22 mins $Therapeutic Exercise: 8-22 mins $Therapeutic Activity: 8-22 mins                     D. Scott Yitta [place] PT, DPT 12/06/22, 3:37 PM

## 2022-12-06 NOTE — Progress Notes (Signed)
Manufacturing engineer Michele [name])  Hospitalized Hospice Patient  Michele [name] is a current hospice patient followed at [facility] with hospice diagnosis of cerebrovascular disease.  Patient fell at her facility and was admitted to [facility] on 1.30.24 with left hip fracture and repair on 1.31.24.  This is a related hospital admission.  Visit with patient at the bedside.  Michele [name] was sitting up in the recliner eating lunch.  She is alert to person and place and very pleasant.  Her caregiver was present during the visit.  Patient has gotten up and worked with physical therapy today.  States it hurt really bad.  She is complaining about the pur wick.  Nurse checked it earlier and everything was intact.  Patient is sore around her perineum.    Patient continues to complain of pain with movement.  Receiving robaxin '500mg'$  PO every 6 hours as needed with dose today at 0430.  Also receiving hydrocodone-acetaminophen 5-'325mg'$  PO every 6 hours prn pain with one tablet given today at 0600 and last evening at 2252.  Remains appropriate for GIP as she requires frequent skilled nurse assessment to monitor for pain and evaluation of interventions in the post operative patient.    VS:  Temp: 97.7BP: 103/56, P: 72, R: 18, SPO2:  97% on RA Ht:  5'2,  Wt: 136#     BMI:  18 Last BM:  2.1.24  Hospital Problems   Left intertrochanteric femur fracture After mechanical fall at home. CT head nothing acute. S/p intramedullary nail operative repair on 1/31 w/ Dr. Harlow Mares - pain is controlled, BM today, hgb stable - lovenox dvt ppx while inpatient, plan for aspirin at discharge - ortho f/u 2 wks   2.  Anemia Chronic normocytic hgb 10 on arrival but baseline close to 8, was 7.9 yesterday. No report of melena or other bleeding. Transfused one unit 1/31, today post-op hgb is 9.5. Iron studies suggest baseline anemia of chronic disease.  - HGB today 8.3, HCT 25.5   3.  HTN currently bp low normal, received fluids overnight for  post-op hypotension - holding home meds (ramipril, spironolactone, metoprolol). Given daughter's wishes for mainly comfort-based approach, will plan on holding at discharge   4.  Hyponatremia Chronic, baseline appears to be low 130s, here 130 today. Tsh wnl. Not volume overloaded. Labs suggest siadh. Am cortisol appropriate - holding home spiro as above. Sertraline may also contribute   5.  Urinary retention AM 1/31 unable to void and bladder scan showed 400 ml. Foley placed, I/o cath doesn't appear was tried. Foley now discontinued and patient able to urinate - monitor 2.2.24- resolved   6.  Hx CVA Hospitalized earlier this year, CT head shows more recent infarcts as well, nothing acute - not on secondary prevention meds given comfort-based approach   7.   Dementia - Hospice -[facility], has home [name] aides 24/7, lives in assisted living, also followed by hospice. Daughter says approach is primarily comfort, treating what's easily treatable, not extending life - cont home seroquel qhs, xanax prn, sertraline   8 A-fib Rate controlled. At last cardiology visit was on apixaban but discontinued given comfort based approach - resume metop when bp improves   9- Aortic stenosis Moderate. Appears compensated - monitor  Discharge Planning: Plan to discharge back to [name] with continued hospice services and paid 24/7 caregivers.  Will continue to monitor through final disposition.   Family Contact: Spoke with patient's daughter,  Edd Fabian to discuss discharge needs.  Edd Fabian would like to get patient back to [name].  She was not happy that the patient was going to discharge earlier today.  She feels she needs more time to get things set up in the home.  I did speak with her regarding DME needs upon discharge.  She will need a hospital bed.  Has BSC and wheelchair already in the home.  I did speak with her at length about care needs being much greater upon discharge and may  need certified nursing assistance to help Ms. Arzola with her increased care needs.  Edd Fabian confirmed that her current caregivers are just sitters and companions.  Notified hospice team of need and asked her social worker to reach out to [place] to help her with numbers of companies who provide CNA's.   Updated hospital team.   IDT:  Updated- Asked hospice social worker to help Edd Fabian navigate getting CNA's in the home at discharge due to increased care needs.   Goals of Care:  Clear goals of care- discharge back to [facility] with hospice services, 24/7 caregivers and comfort approach.  Dimas Aguas, RN Nurse Liaison [phone]

## 2022-12-06 NOTE — TOC Progression Note (Signed)
Transition of Care [facility]) - Progression Note    Patient Details  Name: Michele [name] MRN: [id number] Date of Birth: 02-Apr-1924  Transition of Care [facility]) CM/SW [place], RN Phone Number: 12/06/2022, 4:46 PM  Clinical Narrative:   Damaris Schooner with Marcene Brawn from Hospice, She stated that Hospice is working to get Hospital bed arranged to go to the [facility] with her 24 hour sitter/caregiver She stated that the caregivers are elderly, I explained that they had STR bed options and they refused, I explained that they appealed the Discharge and that if it is upheld that they will have to pay pay out of pocket, she stated that Hospice will not likely be able to get the Hospital bed delivered and caregivers in until Monday, I explained that once the appeal is decided then they will pay out of pocket for hospital stay and that the Daughter stated understanding, she stated that they daughter told her that as well.  She will touch base with the weekend [facility] as well    Expected Discharge Plan: [place] Barriers to Discharge: Insurance Authorization, SNF Pending bed offer  Expected Discharge Plan and Services   Discharge Planning Services: CM Consult   Living arrangements for the past 2 months: [place] Expected Discharge Date: 12/06/22                                     Social Determinants of Health (SDOH) Interventions SDOH Screenings   Food Insecurity: No Food Insecurity (12/04/2022)  Housing: Low Risk  (12/04/2022)  Transportation Needs: No Transportation Needs (12/04/2022)  Utilities: Not At Risk (12/04/2022)  Tobacco Use: Low Risk  (12/05/2022)    Readmission Risk Interventions     No data to display

## 2022-12-07 DIAGNOSIS — Z8781 Personal history of (healed) traumatic fracture: Secondary | ICD-10-CM | POA: Diagnosis not present

## 2022-12-07 MED ORDER — ALPRAZOLAM 0.25 MG PO TABS: 0.2500 mg | ORAL_TABLET | Freq: Two times a day (BID) | ORAL | 0 refills | Status: DC | PRN

## 2022-12-07 MED ORDER — HYDROCODONE-ACETAMINOPHEN 5-325 MG PO TABS: 1.0000 | ORAL_TABLET | Freq: Four times a day (QID) | ORAL | 0 refills | Status: DC

## 2022-12-07 NOTE — Progress Notes (Signed)
Patient discharging to [facility].Report given to [facility]  clinical coordinator at [phone]. Awaiting EMS pickup.

## 2022-12-07 NOTE — TOC Progression Note (Signed)
Transition of Care [facility]) - Progression Note    Patient Details  Name: Michele [name] MRN: [id number] Date of Birth: 13-Jan-1924  Transition of Care [facility]) CM/SW Contact  Izola Price, RN Phone Number: 12/07/2022, 9:37 AM  Clinical Narrative: 2/3: Doreatha Lew from [facility] called RN CM to explain that [employer] is covering patient's hospital stay, not Medicare. Plan is to admit patient to inpatient unit today and will plan care from that point on along with daughter Edd Fabian. Marcene Brawn has spoke to daughter this am regarding this and she is on board as she is [place]. Updated provider via secure chat. Marcene Brawn will contact EMS when ready, this RN CM will provide EMS transport forms excluding provider signed DNR form. Simmie Davies RN CM      Expected Discharge Plan: Skilled Nursing Facility Barriers to Discharge: Ship broker, SNF Pending bed offer  Expected Discharge Plan and Services   Discharge Planning Services: CM Consult   Living arrangements for the past 2 months: [place] Expected Discharge Date: 12/06/22                                     Social Determinants of Health (SDOH) Interventions SDOH Screenings   Food Insecurity: No Food Insecurity (12/04/2022)  Housing: Low Risk  (12/04/2022)  Transportation Needs: No Transportation Needs (12/04/2022)  Utilities: Not At Risk (12/04/2022)  Tobacco Use: Low Risk  (12/05/2022)    Readmission Risk Interventions     No data to display

## 2022-12-07 NOTE — TOC Transition Note (Addendum)
Transition of Care [facility]) - CM/SW Discharge Note   Patient Details  Name: Michele [name] MRN: [id number] Date of Birth: 05-Nov-1923  Transition of Care [facility]) CM/SW Contact:  Izola Price, RN Phone Number: 12/07/2022, 9:50 AM   Clinical Narrative: 2/3: Patient to be discharged with transfer to [facility] facility. Doreatha Lew, liaison, contacted RN CM after speaking with daughter, out of state. Patient's insurance is [employer], which is covering this hospital stay/admission as a current patient of [facility]. [employer] Medicare listed in chart as primary coverage is not covering this admission, per [place] Sessions, so discharge appeal in progress by Judithann Graves was not insurance driven in this case.   EMS forms printed to unit printer excluding DNR forms and EMS contacted by hospice liaison for 11 am pick up. DC Summary in per provider. RN ready to call report to [phone] or [phone]  Simmie Davies RN CM   Final next level of care: [place] Barriers to Discharge: Barriers Resolved   Patient Goals and CMS Choice      Discharge Placement                Patient chooses bed at:  [facility] inpatient hospice facility.) Patient to be transferred to facility by: [facility] Name of family member notified: Rayfield,Gayle (Daughter) [phone] (Home Phone) Notified by Raquel James hospice liaison this am. Patient and family notified of of transfer: 12/07/22  Discharge Plan and Services Additional resources added to the After Visit Summary for     Discharge Planning Services: CM Consult            DME Arranged: N/A DME Agency: NA       HH Arranged: NA HH Agency: NA        Social Determinants of Health (SDOH) Interventions SDOH Screenings   Food Insecurity: No Food Insecurity (12/04/2022)  Housing: Low Risk  (12/04/2022)  Transportation Needs: No Transportation Needs (12/04/2022)  Utilities: Not At Risk (12/04/2022)  Tobacco Use: Low Risk   (12/05/2022)     Readmission Risk Interventions     No data to display

## 2022-12-07 NOTE — Progress Notes (Signed)
Physical Therapy Treatment Patient Details Name: Michele [name] MRN: [id number] DOB: Apr 28, 1924 Today's Date: 12/07/2022   History of Present Illness Pt is a 89 y.o. female with medical history significant of right parietal occipital infarct, anxiety, PAF of AC due to chronic anemia, memory deficit, depression, hypertension, large hiatal hernia, and diverticulosis who presents to ED s/p slip and fall on wet surface while walking with walker.  Found to have L hip fx and now s/p IM nailing 12/04/22.    PT Comments    Pt was long sitting in bed upon arrival. She is alert but disoriented x 2. Pleasantly confused throughout. Pt requested to get OOB to toilet. She continues to require extensive assistance to safely exit bed, stand, and ambulate short distances with RW. She did successfully urinate on BSC prior to standing and ambulating ~ 8 ft with RW + chair follow. Pt was repositioned in recliner at conclusion of session with personal caregiver assisting pt with eating breakfast. SNF at DC remains appropriate to maximize her independence while assisting her to PLOF.    Recommendations for follow up therapy are one component of a multi-disciplinary discharge planning process, led by the attending physician.  Recommendations may be updated based on patient status, additional functional criteria and insurance authorization.  Follow Up Recommendations  Skilled nursing-short term rehab (<3 hours/day)     Assistance Recommended at Discharge Frequent or constant Supervision/Assistance  Patient can return home with the following A lot of help with bathing/dressing/bathroom;Two people to help with walking and/or transfers;Assistance with cooking/housework;Assist for transportation;Help with stairs or ramp for entrance   Equipment Recommendations  Other (comment) (defer to next level of care.)       Precautions / Restrictions Precautions Precautions: Fall Restrictions Weight Bearing Restrictions: Yes LLE  Weight Bearing: Weight bearing as tolerated     Mobility  Bed Mobility Overal bed mobility: Needs Assistance Bed Mobility: Supine to Sit  Supine to sit: HOB elevated, Mod assist, Max assist    General bed mobility comments: Increased time required. Assisted BLEs to EOB. pt was able to use UEs to scoot towards EOB    Transfers Overall transfer level: Needs assistance Equipment used: Rolling walker (2 wheels) Transfers: Sit to/from Stand Sit to Stand: Mod assist, From elevated surface    General transfer comment: Mod assist + increased time. Vcs for handplacement.    Ambulation/Gait Ambulation/Gait assistance: Min assist, Mod assist Gait Distance (Feet): 8 Feet Assistive device: Rolling walker (2 wheels) Gait Pattern/deviations: Step-to pattern, Trunk flexed, Decreased step length - right, Decreased stance time - left, Antalgic Gait velocity: decreased     General Gait Details: pt has poor gait posture in standing however tolerated well. vcs to slow daown. attempts to leave walker too far in front of her and pushes it to the side when turning to get into recliner. max vcs + mod assist to prevent fall. Overall tolerated well. will progress gait distances in PM session    Balance Overall balance assessment: Needs assistance Sitting-balance support: Bilateral upper extremity supported Sitting balance-Leahy Scale: Fair     Standing balance support: Bilateral upper extremity supported, During functional activity, Reliant on assistive device for balance Standing balance-Leahy Scale: Fair       Cognition Arousal/Alertness: Awake/alert Behavior During Therapy: WFL for tasks assessed/performed Overall Cognitive Status: History of cognitive impairments - at baseline    General Comments: Pt is alert and extremely pleasant. Eager to get OOB to urinate on a toilet. Is oriented to self and  knew she hurt her leg but unaware she is in hospital or time of day. Pleasantly confused         Exercises Total Joint Exercises Ankle Circles/Pumps: AROM, Strengthening, Both, 10 reps Quad Sets: Strengthening, Both, 10 reps Gluteal Sets: Strengthening, Both, 10 reps Long Arc Quad: Strengthening, Both, 10 reps    General Comments General comments (skin integrity, edema, etc.): Pt personal caregiver present throughout and very helpful      Pertinent Vitals/Pain Pain Assessment Pain Assessment: PAINAD Breathing: normal Negative Vocalization: none Facial Expression: smiling or inexpressive Body Language: relaxed Consolability: no need to console PAINAD Score: 0 Pain Location: Pain with any L hip movement, no pain at rest Pain Descriptors / Indicators: Discomfort Pain Intervention(s): Limited activity within patient's tolerance, Monitored during session, Repositioned     PT Goals (current goals can now be found in the care plan section) Acute Rehab PT Goals Patient Stated Goal: I want to get up to toilet to pee Progress towards PT goals: Progressing toward goals    Frequency    BID      PT Plan Current plan remains appropriate       AM-PAC PT "6 Clicks" Mobility   Outcome Measure  Help needed turning from your back to your side while in a flat bed without using bedrails?: A Lot Help needed moving from lying on your back to sitting on the side of a flat bed without using bedrails?: A Lot Help needed moving to and from a bed to a chair (including a wheelchair)?: A Lot Help needed standing up from a chair using your arms (e.g., wheelchair or bedside chair)?: A Lot Help needed to walk in hospital room?: A Lot Help needed climbing 3-5 steps with a railing? : Total 6 Click Score: 11    End of Session   Activity Tolerance: Patient tolerated treatment well Patient left: in chair;with call bell/phone within reach;Other (comment);with chair alarm set (personal caregiver at bedside assisting pt with eating breakfast) Nurse Communication: Mobility status PT Visit  Diagnosis: Muscle weakness (generalized) (M62.81);Difficulty in walking, not elsewhere classified (R26.2);Pain Pain - Right/Left: Left Pain - part of body: Hip     Time: 2774-1287 PT Time Calculation (min) (ACUTE ONLY): 24 min  Charges:  $Gait Training: 8-22 mins $Therapeutic Exercise: 8-22 mins                     Julaine Fusi PTA 12/07/22, 9:55 AM

## 2022-12-07 NOTE — Progress Notes (Signed)
Manufacturing engineer Nurse Liaison Note  Michele [name] is a current hospice patient followed at [facility] with hospice diagnosis of cerebrovascular disease. Patient fell at her facility and was admitted to [facility] on 1.30.24 with left hip fracture and repair on 1.31.24. This is a related hospital admission.  Follow up with Michele [name] via phone this morning to discuss discharge options for her mother.  Offered hospice home bed for today which would allow her time to get CNA's arranged in the home and to have hospital bed delivered.  She has arranged for people to take down Michele [name] current bed and move furniture to make room for the hospital bed.  One of the private medical caregiver companies thought they could have a team in place Monday or Tuesday.  Michele [name], [name], is in agreement with and accepts bed offer at the hospice home.  She is appreciative of this option, as she is sick with pneumonia and on IV antibiotics and nebulizers.  She hopes to be well enough to travel to [place] (she is in [place]) by the middle of next week.  Michele [name] will continue to provide 24/7 sitters while she is at the hospice home as she wants to ensure familiar faces for Michele [name] with new setting and her dementia.  EMS transport arranged for pick up at 1100.   RN to call report to the hospice home at [phone].  Discharge summary sent to the hospice home.  Will continue to follow through final disposition.  Dimas Aguas, RN Nurse Liaison [phone]

## 2023-01-03 DEATH — deceased
